# Patient Record
Sex: Female | Born: 1970 | Hispanic: No | Marital: Married | Smoking: Former smoker
Health system: Southern US, Community
[De-identification: ages and names within clinical notes are randomized; demographics above are authoritative.]

## PROBLEM LIST (undated history)

## (undated) DIAGNOSIS — A6920 Lyme disease, unspecified: Secondary | ICD-10-CM

## (undated) DIAGNOSIS — Z803 Family history of malignant neoplasm of breast: Secondary | ICD-10-CM

## (undated) DIAGNOSIS — J45909 Unspecified asthma, uncomplicated: Secondary | ICD-10-CM

## (undated) DIAGNOSIS — C189 Malignant neoplasm of colon, unspecified: Secondary | ICD-10-CM

## (undated) DIAGNOSIS — Z800 Family history of malignant neoplasm of digestive organs: Secondary | ICD-10-CM

## (undated) DIAGNOSIS — I10 Essential (primary) hypertension: Secondary | ICD-10-CM

## (undated) DIAGNOSIS — L509 Urticaria, unspecified: Secondary | ICD-10-CM

## (undated) DIAGNOSIS — Z87442 Personal history of urinary calculi: Secondary | ICD-10-CM

## (undated) DIAGNOSIS — T783XXA Angioneurotic edema, initial encounter: Secondary | ICD-10-CM

## (undated) DIAGNOSIS — C801 Malignant (primary) neoplasm, unspecified: Secondary | ICD-10-CM

## (undated) DIAGNOSIS — L309 Dermatitis, unspecified: Secondary | ICD-10-CM

## (undated) HISTORY — DX: Unspecified asthma, uncomplicated: J45.909

## (undated) HISTORY — DX: Urticaria, unspecified: L50.9

## (undated) HISTORY — DX: Lyme disease, unspecified: A69.20

## (undated) HISTORY — PX: WISDOM TOOTH EXTRACTION: SHX21

## (undated) HISTORY — DX: Angioneurotic edema, initial encounter: T78.3XXA

## (undated) HISTORY — DX: Family history of malignant neoplasm of breast: Z80.3

## (undated) HISTORY — DX: Dermatitis, unspecified: L30.9

## (undated) HISTORY — DX: Essential (primary) hypertension: I10

## (undated) HISTORY — PX: APPENDECTOMY: SHX54

## (undated) HISTORY — DX: Family history of malignant neoplasm of digestive organs: Z80.0

---

## 1997-02-07 ENCOUNTER — Inpatient Hospital Stay (HOSPITAL_COMMUNITY): Admission: AD | Admit: 1997-02-07 | Discharge: 1997-02-09 | Payer: Self-pay | Admitting: *Deleted

## 1997-02-13 ENCOUNTER — Encounter: Admission: RE | Admit: 1997-02-13 | Discharge: 1997-05-14 | Payer: Self-pay | Admitting: Obstetrics and Gynecology

## 1999-02-04 ENCOUNTER — Inpatient Hospital Stay (HOSPITAL_COMMUNITY): Admission: AD | Admit: 1999-02-04 | Discharge: 1999-02-04 | Payer: Self-pay | Admitting: Obstetrics

## 1999-03-08 ENCOUNTER — Other Ambulatory Visit: Admission: RE | Admit: 1999-03-08 | Discharge: 1999-03-08 | Payer: Self-pay | Admitting: Obstetrics and Gynecology

## 1999-07-17 ENCOUNTER — Inpatient Hospital Stay (HOSPITAL_COMMUNITY): Admission: AD | Admit: 1999-07-17 | Discharge: 1999-07-17 | Payer: Self-pay | Admitting: Obstetrics and Gynecology

## 1999-07-24 ENCOUNTER — Ambulatory Visit (HOSPITAL_COMMUNITY): Admission: RE | Admit: 1999-07-24 | Discharge: 1999-07-24 | Payer: Self-pay | Admitting: *Deleted

## 1999-07-24 ENCOUNTER — Encounter: Payer: Self-pay | Admitting: *Deleted

## 1999-09-05 ENCOUNTER — Ambulatory Visit (HOSPITAL_COMMUNITY): Admission: RE | Admit: 1999-09-05 | Discharge: 1999-09-05 | Payer: Self-pay | Admitting: Obstetrics and Gynecology

## 1999-09-05 ENCOUNTER — Encounter: Payer: Self-pay | Admitting: Obstetrics and Gynecology

## 1999-10-13 ENCOUNTER — Inpatient Hospital Stay (HOSPITAL_COMMUNITY): Admission: AD | Admit: 1999-10-13 | Discharge: 1999-10-15 | Payer: Self-pay | Admitting: Obstetrics and Gynecology

## 2001-04-14 ENCOUNTER — Other Ambulatory Visit: Admission: RE | Admit: 2001-04-14 | Discharge: 2001-04-14 | Payer: Self-pay | Admitting: Obstetrics and Gynecology

## 2001-06-16 ENCOUNTER — Inpatient Hospital Stay (HOSPITAL_COMMUNITY): Admission: AD | Admit: 2001-06-16 | Discharge: 2001-06-16 | Payer: Self-pay | Admitting: Obstetrics and Gynecology

## 2001-07-07 ENCOUNTER — Encounter: Payer: Self-pay | Admitting: Obstetrics and Gynecology

## 2001-07-07 ENCOUNTER — Ambulatory Visit (HOSPITAL_COMMUNITY): Admission: RE | Admit: 2001-07-07 | Discharge: 2001-07-07 | Payer: Self-pay | Admitting: Obstetrics and Gynecology

## 2001-10-07 ENCOUNTER — Inpatient Hospital Stay (HOSPITAL_COMMUNITY): Admission: AD | Admit: 2001-10-07 | Discharge: 2001-10-07 | Payer: Self-pay | Admitting: Obstetrics and Gynecology

## 2001-11-03 ENCOUNTER — Ambulatory Visit (HOSPITAL_COMMUNITY): Admission: RE | Admit: 2001-11-03 | Discharge: 2001-11-03 | Payer: Self-pay | Admitting: Obstetrics and Gynecology

## 2001-11-03 ENCOUNTER — Encounter: Payer: Self-pay | Admitting: Obstetrics and Gynecology

## 2001-11-13 ENCOUNTER — Inpatient Hospital Stay (HOSPITAL_COMMUNITY): Admission: AD | Admit: 2001-11-13 | Discharge: 2001-11-15 | Payer: Self-pay | Admitting: Obstetrics and Gynecology

## 2001-11-16 ENCOUNTER — Encounter: Admission: RE | Admit: 2001-11-16 | Discharge: 2001-12-16 | Payer: Self-pay | Admitting: Obstetrics and Gynecology

## 2002-01-16 ENCOUNTER — Encounter: Admission: RE | Admit: 2002-01-16 | Discharge: 2002-02-15 | Payer: Self-pay | Admitting: Obstetrics and Gynecology

## 2008-10-24 ENCOUNTER — Emergency Department (HOSPITAL_COMMUNITY): Admission: EM | Admit: 2008-10-24 | Discharge: 2008-10-24 | Payer: Self-pay | Admitting: Emergency Medicine

## 2009-10-12 ENCOUNTER — Ambulatory Visit (HOSPITAL_COMMUNITY): Admission: RE | Admit: 2009-10-12 | Discharge: 2009-10-12 | Payer: Self-pay | Admitting: Gastroenterology

## 2009-10-29 ENCOUNTER — Ambulatory Visit (HOSPITAL_COMMUNITY)
Admission: RE | Admit: 2009-10-29 | Discharge: 2009-10-29 | Payer: Self-pay | Source: Home / Self Care | Admitting: Gastroenterology

## 2009-12-04 ENCOUNTER — Ambulatory Visit (HOSPITAL_COMMUNITY): Admission: RE | Admit: 2009-12-04 | Discharge: 2009-12-04 | Payer: Self-pay | Admitting: Gastroenterology

## 2010-04-11 LAB — URINALYSIS, ROUTINE W REFLEX MICROSCOPIC
Ketones, ur: 40 mg/dL — AB
Nitrite: NEGATIVE
Specific Gravity, Urine: 1.024 (ref 1.005–1.030)
Urobilinogen, UA: 1 mg/dL (ref 0.0–1.0)
pH: 5 (ref 5.0–8.0)

## 2010-04-11 LAB — DIFFERENTIAL
Eosinophils Absolute: 0 10*3/uL (ref 0.0–0.7)
Lymphocytes Relative: 6 % — ABNORMAL LOW (ref 12–46)
Lymphs Abs: 0.8 10*3/uL (ref 0.7–4.0)
Monocytes Relative: 6 % (ref 3–12)
Neutro Abs: 12 10*3/uL — ABNORMAL HIGH (ref 1.7–7.7)
Neutrophils Relative %: 89 % — ABNORMAL HIGH (ref 43–77)

## 2010-04-11 LAB — URINE CULTURE: Colony Count: 25000

## 2010-04-11 LAB — CBC
Hemoglobin: 11.1 g/dL — ABNORMAL LOW (ref 12.0–15.0)
MCHC: 35.2 g/dL (ref 30.0–36.0)
MCV: 85.5 fL (ref 78.0–100.0)
RDW: 13.9 % (ref 11.5–15.5)

## 2010-04-11 LAB — URINE MICROSCOPIC-ADD ON

## 2010-04-11 LAB — COMPREHENSIVE METABOLIC PANEL
ALT: 9 U/L (ref 0–35)
Calcium: 8.4 mg/dL (ref 8.4–10.5)
Creatinine, Ser: 0.9 mg/dL (ref 0.4–1.2)
Glucose, Bld: 100 mg/dL — ABNORMAL HIGH (ref 70–99)
Sodium: 133 mEq/L — ABNORMAL LOW (ref 135–145)
Total Protein: 6.8 g/dL (ref 6.0–8.3)
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
## 2011-01-07 HISTORY — PX: ABDOMINAL HYSTERECTOMY: SHX81

## 2012-09-28 ENCOUNTER — Other Ambulatory Visit: Payer: Self-pay | Admitting: Family Medicine

## 2012-09-28 DIAGNOSIS — N946 Dysmenorrhea, unspecified: Secondary | ICD-10-CM

## 2012-09-29 ENCOUNTER — Ambulatory Visit
Admission: RE | Admit: 2012-09-29 | Discharge: 2012-09-29 | Disposition: A | Discharge status: Home / Self Care | Payer: 59 | Source: Ambulatory Visit | Attending: Family Medicine | Admitting: Family Medicine

## 2012-09-29 DIAGNOSIS — N946 Dysmenorrhea, unspecified: Secondary | ICD-10-CM

## 2012-12-09 ENCOUNTER — Other Ambulatory Visit: Payer: Self-pay | Admitting: Obstetrics and Gynecology

## 2014-04-19 ENCOUNTER — Other Ambulatory Visit: Payer: Self-pay | Admitting: Family Medicine

## 2014-04-19 DIAGNOSIS — R1084 Generalized abdominal pain: Secondary | ICD-10-CM

## 2014-04-21 ENCOUNTER — Inpatient Hospital Stay
Admission: RE | Admit: 2014-04-21 | Discharge: 2014-04-21 | Disposition: A | Discharge status: Home / Self Care | Payer: Self-pay | Source: Ambulatory Visit | Attending: Family Medicine | Admitting: Family Medicine

## 2014-09-25 ENCOUNTER — Ambulatory Visit: Payer: Self-pay | Admitting: Family Medicine

## 2016-01-07 DIAGNOSIS — A6920 Lyme disease, unspecified: Secondary | ICD-10-CM

## 2016-01-07 HISTORY — DX: Lyme disease, unspecified: A69.20

## 2016-04-14 DIAGNOSIS — R52 Pain, unspecified: Secondary | ICD-10-CM | POA: Diagnosis not present

## 2016-04-14 DIAGNOSIS — Z842 Family history of other diseases of the genitourinary system: Secondary | ICD-10-CM | POA: Diagnosis not present

## 2016-04-14 DIAGNOSIS — J209 Acute bronchitis, unspecified: Secondary | ICD-10-CM | POA: Diagnosis not present

## 2016-07-04 ENCOUNTER — Encounter: Payer: Self-pay | Admitting: Family

## 2016-07-04 ENCOUNTER — Ambulatory Visit (INDEPENDENT_AMBULATORY_CARE_PROVIDER_SITE_OTHER): Payer: 59 | Admitting: Family

## 2016-07-04 VITALS — BP 155/104 | HR 79 | Temp 100.0°F | Ht 65.0 in | Wt 227.6 lb

## 2016-07-04 DIAGNOSIS — J0100 Acute maxillary sinusitis, unspecified: Secondary | ICD-10-CM | POA: Diagnosis not present

## 2016-07-04 DIAGNOSIS — I10 Essential (primary) hypertension: Secondary | ICD-10-CM

## 2016-07-04 MED ORDER — AMOXICILLIN-POT CLAVULANATE 875-125 MG PO TABS: 1.0000 | ORAL_TABLET | Freq: Two times a day (BID) | ORAL | 0 refills | Status: DC

## 2016-07-04 NOTE — Progress Notes (Signed)
Subjective:    Patient ID: Alison Ramsey, female    DOB: 1970-07-06, 46 y.o.   MRN: [id number]  PT presents to the office today to establish care and complaints of sinus issues. Pt's BP is elevated today. She is not taking decongestants. States she has been told in the past her BP was high and she has tried diet and losing weight.  Sinus Problem  This is a new problem. The current episode started in the past 7 days. The problem has been gradually worsening since onset. The maximum temperature recorded prior to her arrival was 100.4 - 100.9 F. Her pain is at a severity of 7/10. The pain is moderate. Associated symptoms include chills, congestion, coughing, ear pain, a hoarse voice, sinus pressure, sneezing and a sore throat. Past treatments include oral decongestants and acetaminophen. The treatment provided mild relief.  Cough  Associated symptoms include chills, ear pain, a fever and a sore throat.  Fever   Associated symptoms include congestion, coughing, ear pain and a sore throat.      Review of Systems  Constitutional: Positive for chills and fever.  HENT: Positive for congestion, ear pain, hoarse voice, sinus pressure, sneezing and sore throat.   Respiratory: Positive for cough.   All other systems reviewed and are negative.      Objective:   Physical Exam  Constitutional: She is oriented to person, place, and time. She appears well-developed and well-nourished. No distress.  HENT:  Head: Normocephalic and atraumatic.  Right Ear: External ear normal.  Nose: Mucosal edema and rhinorrhea present. Right sinus exhibits maxillary sinus tenderness. Left sinus exhibits maxillary sinus tenderness.  Mouth/Throat: Posterior oropharyngeal erythema present.  Eyes: Pupils are equal, round, and reactive to light.  Neck: Normal range of motion. Neck supple. No thyromegaly present.  Cardiovascular: Normal rate, regular rhythm, normal heart sounds and intact distal pulses.   No murmur  heard. Pulmonary/Chest: Effort normal and breath sounds normal. No respiratory distress. She has no wheezes.  Abdominal: Soft. Bowel sounds are normal. She exhibits no distension. There is no tenderness.  Musculoskeletal: Normal range of motion. She exhibits no edema or tenderness.  Neurological: She is alert and oriented to person, place, and time. She has normal reflexes. No cranial nerve deficit.  Skin: Skin is warm and dry.  Psychiatric: She has a normal mood and affect. Her behavior is normal. Judgment and thought content normal.  Vitals reviewed.     BP (!) 155/104   Pulse 79   Temp 100 F (37.8 C) (Oral)   Ht 5\' 5"  (1.651 m)   Wt 227 lb 9.6 oz (103.2 kg)   BMI 37.87 kg/m      Assessment & Plan:  1. Acute maxillary sinusitis, recurrence not specified - Take meds as prescribed - Use a cool mist humidifier  -Use saline nose sprays frequently -Saline irrigations of the nose can be very helpful if done frequently.  * 4X daily for 1 week*  * Use of a nettie pot can be helpful with this. Follow directions with this* -Force fluids -For any cough or congestion  Use plain Mucinex- regular strength or max strength is fine   * Children- consult with Pharmacist for dosing -For fever or aces or pains- take tylenol or ibuprofen appropriate for age and weight.  * for fevers greater than 101 orally you may alternate ibuprofen and tylenol every  3 hours. -Throat lozenges if help -New toothbrush in 3 days - amoxicillin-clavulanate (AUGMENTIN) 875-125 MG tablet;  Take 1 tablet by mouth 2 (two) times daily.  Dispense: 14 tablet; Refill: 0  2. Essential hypertension PT does not want to start medication today since she is sick. States she will come back and it rechecked and if it is still high at that times she will start medications. Pt encouraged to make CPE appt in 2 weeks    Evelina Dun, FNP

## 2016-07-04 NOTE — Patient Instructions (Signed)

## 2016-07-07 ENCOUNTER — Encounter: Payer: Self-pay | Admitting: Family

## 2016-07-08 ENCOUNTER — Other Ambulatory Visit: Payer: Self-pay | Admitting: Family

## 2016-07-08 MED ORDER — LEVOFLOXACIN 500 MG PO TABS
500.0000 mg | ORAL_TABLET | Freq: Every day | ORAL | 0 refills | Status: DC
Start: 2016-07-08 — End: 2016-07-17

## 2016-07-17 ENCOUNTER — Encounter: Payer: Self-pay | Admitting: Family

## 2016-07-17 ENCOUNTER — Ambulatory Visit (INDEPENDENT_AMBULATORY_CARE_PROVIDER_SITE_OTHER): Payer: 59 | Admitting: Family

## 2016-07-17 VITALS — BP 155/111 | HR 80 | Temp 97.2°F | Ht 65.0 in | Wt 225.0 lb

## 2016-07-17 DIAGNOSIS — I10 Essential (primary) hypertension: Secondary | ICD-10-CM

## 2016-07-17 DIAGNOSIS — Z01419 Encounter for gynecological examination (general) (routine) without abnormal findings: Secondary | ICD-10-CM | POA: Diagnosis not present

## 2016-07-17 DIAGNOSIS — B373 Candidiasis of vulva and vagina: Secondary | ICD-10-CM

## 2016-07-17 DIAGNOSIS — Z0000 Encounter for general adult medical examination without abnormal findings: Secondary | ICD-10-CM | POA: Diagnosis not present

## 2016-07-17 DIAGNOSIS — N898 Other specified noninflammatory disorders of vagina: Secondary | ICD-10-CM

## 2016-07-17 DIAGNOSIS — B3731 Acute candidiasis of vulva and vagina: Secondary | ICD-10-CM

## 2016-07-17 LAB — URINALYSIS, COMPLETE
BILIRUBIN UA: NEGATIVE
Glucose, UA: NEGATIVE
KETONES UA: NEGATIVE
LEUKOCYTES UA: NEGATIVE
Nitrite, UA: NEGATIVE
PH UA: 6 (ref 5.0–7.5)
PROTEIN UA: NEGATIVE
RBC UA: NEGATIVE
SPEC GRAV UA: 1.025 (ref 1.005–1.030)
UUROB: 0.2 mg/dL (ref 0.2–1.0)

## 2016-07-17 LAB — MICROSCOPIC EXAMINATION
BACTERIA UA: NONE SEEN
RBC, UA: NONE SEEN /hpf (ref 0–?)
RENAL EPITHEL UA: NONE SEEN /HPF

## 2016-07-17 LAB — WET PREP FOR TRICH, YEAST, CLUE
Clue Cell Exam: NEGATIVE
TRICHOMONAS EXAM: NEGATIVE
Yeast Exam: NEGATIVE

## 2016-07-17 MED ORDER — LISINOPRIL-HYDROCHLOROTHIAZIDE 20-12.5 MG PO TABS: 1.0000 | ORAL_TABLET | Freq: Every day | ORAL | 3 refills | Status: DC

## 2016-07-17 MED ORDER — FLUCONAZOLE 150 MG PO TABS: 150.0000 mg | ORAL_TABLET | ORAL | 0 refills | Status: DC | PRN

## 2016-07-17 NOTE — Progress Notes (Signed)
Subjective:    Patient ID: Alison Ramsey, female    DOB: 05-Oct-1970, 46 y.o.   MRN: [id number]  Pt presents to the office today CPE with pap.  Gynecologic Exam  The patient's primary symptoms include genital itching and vaginal discharge. The patient's pertinent negatives include no genital lesions. This is a chronic problem. The problem has been unchanged. Pertinent negatives include no headaches. The vaginal discharge was thick.  Hypertension  The current episode started more than 1 month ago. The problem is unchanged. The problem is uncontrolled. Associated symptoms include peripheral edema. Pertinent negatives include no headaches, malaise/fatigue or shortness of breath. Risk factors for coronary artery disease include obesity and sedentary lifestyle. Past treatments include nothing. The current treatment provides no improvement. There is no history of kidney disease, CAD/MI, CVA or heart failure.      Review of Systems  Constitutional: Negative for malaise/fatigue.  Respiratory: Negative for shortness of breath.   Genitourinary: Positive for vaginal discharge.  Neurological: Negative for headaches.  All other systems reviewed and are negative.  Breast Cancer-relatedfamily history is not on file.  Social History   Social History  . Marital status: Married    Spouse name: N/A  . Number of children: N/A  . Years of education: N/A   Social History Main Topics  . Smoking status: Never Smoker  . Smokeless tobacco: Never Used  . Alcohol use Yes     Comment: occasionally  . Drug use: No  . Sexual activity: Yes   Other Topics Concern  . None   Social History Narrative  . None       Objective:   Physical Exam  Constitutional: She is oriented to person, place, and time. She appears well-developed and well-nourished. No distress.  HENT:  Head: Normocephalic and atraumatic.  Right Ear: External ear normal.  Left Ear: External ear normal.  Nose: Nose normal.    Mouth/Throat: Oropharynx is clear and moist.  Eyes: Pupils are equal, round, and reactive to light.  Neck: Normal range of motion. Neck supple. No thyromegaly present.  Cardiovascular: Normal rate, regular rhythm, normal heart sounds and intact distal pulses.   No murmur heard. Pulmonary/Chest: Effort normal and breath sounds normal. No respiratory distress. She has no wheezes. Right breast exhibits no inverted nipple, no mass, no nipple discharge, no skin change and no tenderness. Left breast exhibits no inverted nipple, no mass, no nipple discharge, no skin change and no tenderness. Breasts are symmetrical.  Abdominal: Soft. Bowel sounds are normal. She exhibits no distension. There is no tenderness.  Genitourinary: Vagina normal.  Genitourinary Comments: Bimanual exam- no adnexal masses or tenderness, ovaries nonpalpable   Cervix parous and pink- No discharge   Musculoskeletal: Normal range of motion. She exhibits no edema or tenderness.  Neurological: She is alert and oriented to person, place, and time.  Skin: Skin is warm and dry.  Psychiatric: She has a normal mood and affect. Her behavior is normal. Judgment and thought content normal.  Vitals reviewed.     BP (!) 155/111   Pulse 80   Temp (!) 97.2 F (36.2 C) (Oral)   Ht _0  (1.651 m)   Wt 225 lb (102.1 kg)   BMI 37.44 kg/m      Assessment & Plan:  1. Normal gynecologic examination - Urinalysis, Complete - CMP14+EGFR - Pap IG w/ reflex to HPV when ASC-U  2. Essential hypertension Pt started on Zestoretic 20-12.5 mg today -Dash diet information given -Exercise encouraged -  Stress Management  -Continue current meds -RTO in 2 weeks  - CMP14+EGFR - lisinopril-hydrochlorothiazide (ZESTORETIC) 20-12.5 MG tablet; Take 1 tablet by mouth daily.  Dispense: 90 tablet; Refill: 3  3. Annual physical exam - CBC with Differential/Platelet - CMP14+EGFR - HIV antibody - Lipid panel - VITAMIN D 25 Hydroxy (Vit-D  Deficiency, Fractures) - TSH  4. Vaginal discharge  - WET PREP FOR TRICH, YEAST, CLUE  5. Vagina, candidiasis - fluconazole (DIFLUCAN) 150 MG tablet; Take 1 tablet (150 mg total) by mouth every three (3) days as needed.  Dispense: 3 tablet; Refill: 0   Continue all meds Labs pending Health Maintenance reviewed Diet and exercise encouraged RTO 2 weeks to recheck HTN  Evelina Dun, FNP

## 2016-07-17 NOTE — Patient Instructions (Signed)

## 2016-07-18 LAB — PAP IG W/ RFLX HPV ASCU: PAP Smear Comment: 0

## 2016-07-18 LAB — CBC WITH DIFFERENTIAL/PLATELET
BASOS ABS: 0 10*3/uL (ref 0.0–0.2)
Basos: 0 %
EOS (ABSOLUTE): 0.1 10*3/uL (ref 0.0–0.4)
Eos: 1 %
HEMATOCRIT: 40.6 % (ref 34.0–46.6)
Hemoglobin: 14.2 g/dL (ref 11.1–15.9)
IMMATURE GRANULOCYTES: 0 %
Immature Grans (Abs): 0 10*3/uL (ref 0.0–0.1)
LYMPHS: 16 %
Lymphocytes Absolute: 1.6 10*3/uL (ref 0.7–3.1)
MCH: 29.6 pg (ref 26.6–33.0)
MCHC: 35 g/dL (ref 31.5–35.7)
MCV: 85 fL (ref 79–97)
MONOCYTES: 4 %
Monocytes Absolute: 0.4 10*3/uL (ref 0.1–0.9)
NEUTROS ABS: 7.8 10*3/uL — AB (ref 1.4–7.0)
NEUTROS PCT: 79 %
Platelets: 262 10*3/uL (ref 150–379)
RBC: 4.79 x10E6/uL (ref 3.77–5.28)
RDW: 13.9 % (ref 12.3–15.4)
WBC: 9.9 10*3/uL (ref 3.4–10.8)

## 2016-07-18 LAB — CMP14+EGFR
A/G RATIO: 1.3 (ref 1.2–2.2)
ALK PHOS: 72 IU/L (ref 39–117)
ALT: 12 IU/L (ref 0–32)
AST: 13 IU/L (ref 0–40)
Albumin: 3.9 g/dL (ref 3.5–5.5)
BUN/Creatinine Ratio: 15 (ref 9–23)
BUN: 17 mg/dL (ref 6–24)
Bilirubin Total: 0.7 mg/dL (ref 0.0–1.2)
CHLORIDE: 101 mmol/L (ref 96–106)
CO2: 20 mmol/L (ref 20–29)
Calcium: 9.2 mg/dL (ref 8.7–10.2)
Creatinine, Ser: 1.16 mg/dL — ABNORMAL HIGH (ref 0.57–1.00)
GFR calc Af Amer: 66 mL/min/{1.73_m2} (ref >59)
GFR calc non Af Amer: 57 mL/min/{1.73_m2} — ABNORMAL LOW (ref >59)
GLOBULIN, TOTAL: 3 g/dL (ref 1.5–4.5)
Glucose: 120 mg/dL — ABNORMAL HIGH (ref 65–99)
POTASSIUM: 4.4 mmol/L (ref 3.5–5.2)
SODIUM: 137 mmol/L (ref 134–144)
Total Protein: 6.9 g/dL (ref 6.0–8.5)

## 2016-07-18 LAB — LIPID PANEL
CHOLESTEROL TOTAL: 152 mg/dL (ref 100–199)
Chol/HDL Ratio: 3.8 ratio (ref 0.0–4.4)
HDL: 40 mg/dL (ref >39)
LDL Calculated: 92 mg/dL (ref 0–99)
Triglycerides: 101 mg/dL (ref 0–149)
VLDL Cholesterol Cal: 20 mg/dL (ref 5–40)

## 2016-07-18 LAB — VITAMIN D 25 HYDROXY (VIT D DEFICIENCY, FRACTURES): Vit D, 25-Hydroxy: 42.8 ng/mL (ref 30.0–100.0)

## 2016-07-18 LAB — TSH: TSH: 2.07 u[IU]/mL (ref 0.450–4.500)

## 2016-07-18 LAB — HIV ANTIBODY (ROUTINE TESTING W REFLEX): HIV SCREEN 4TH GENERATION: NONREACTIVE

## 2016-07-31 ENCOUNTER — Ambulatory Visit: Payer: 59 | Admitting: Family

## 2016-08-06 ENCOUNTER — Encounter: Payer: Self-pay | Admitting: Family

## 2016-08-06 ENCOUNTER — Ambulatory Visit (INDEPENDENT_AMBULATORY_CARE_PROVIDER_SITE_OTHER): Payer: 59 | Admitting: Family

## 2016-08-06 VITALS — BP 145/100 | HR 81 | Temp 99.4°F | Ht 65.0 in | Wt 223.0 lb

## 2016-08-06 DIAGNOSIS — H60332 Swimmer's ear, left ear: Secondary | ICD-10-CM | POA: Diagnosis not present

## 2016-08-06 DIAGNOSIS — I10 Essential (primary) hypertension: Secondary | ICD-10-CM

## 2016-08-06 MED ORDER — CIPROFLOXACIN-DEXAMETHASONE 0.3-0.1 % OT SUSP: 4.0000 [drp] | Freq: Two times a day (BID) | OTIC | 0 refills | Status: DC

## 2016-08-06 MED ORDER — LISINOPRIL-HYDROCHLOROTHIAZIDE 20-12.5 MG PO TABS: 2.0000 | ORAL_TABLET | Freq: Every day | ORAL | 3 refills | Status: DC

## 2016-08-06 NOTE — Progress Notes (Signed)
   Subjective:    Patient ID: Alison Ramsey, female    DOB: 04-Nov-1970, 46 y.o.   MRN: [id number]  PT presents to the office today to recheck HTN. Pt's BP is not at goal.  Hypertension  This is a new problem. The current episode started more than 1 month ago. The problem has been waxing and waning since onset. The problem is uncontrolled. Associated symptoms include peripheral edema ("at times"). Pertinent negatives include no malaise/fatigue or shortness of breath. Past treatments include ACE inhibitors and diuretics. The current treatment provides mild improvement.  Otalgia   There is pain in the left ear. This is a recurrent problem. The current episode started in the past 7 days. The problem occurs constantly. The problem has been waxing and waning. Maximum temperature: 99. The pain is at a severity of 7/10. The pain is mild. Associated symptoms include ear discharge and hearing loss. She has tried antibiotics for the symptoms. The treatment provided mild relief.      Review of Systems  Constitutional: Negative for malaise/fatigue.  HENT: Positive for ear discharge, ear pain and hearing loss.   Respiratory: Negative for shortness of breath.   All other systems reviewed and are negative.      Objective:   Physical Exam  Constitutional: She is oriented to person, place, and time. She appears well-developed and well-nourished. No distress.  HENT:  Head: Normocephalic and atraumatic.  Right Ear: External ear normal.  Left Ear: There is drainage, swelling and tenderness.  Mouth/Throat: Oropharynx is clear and moist.  Eyes: Pupils are equal, round, and reactive to light.  Neck: Normal range of motion. Neck supple. No thyromegaly present.  Cardiovascular: Normal rate, regular rhythm, normal heart sounds and intact distal pulses.   No murmur heard. Pulmonary/Chest: Effort normal and breath sounds normal. No respiratory distress. She has no wheezes.  Abdominal: Soft. Bowel sounds are  normal. She exhibits no distension. There is no tenderness.  Musculoskeletal: Normal range of motion. She exhibits no edema or tenderness.  Neurological: She is alert and oriented to person, place, and time.  Skin: Skin is warm and dry.  Psychiatric: She has a normal mood and affect. Her behavior is normal. Judgment and thought content normal.  Vitals reviewed.     BP (!) 145/100   Pulse 81   Temp 99.4 F (37.4 C) (Oral)   Ht '5\' 5"'$  (1.651 m)   Wt 223 lb (101.2 kg)   BMI 37.11 kg/m      Assessment & Plan:  1. Essential hypertension Zestoretic increased to tabs from 1 tab -Dash diet information given -Exercise encouraged - Stress Management  -Continue current meds -RTO in 2 weeks  - lisinopril-hydrochlorothiazide (ZESTORETIC) 20-12.5 MG tablet; Take 2 tablets by mouth daily.  Dispense: 180 tablet; Refill: 3 - BMP8+EGFR  2. Acute swimmer's ear of left side Keep clean and dry Avoid getting water in ear Do not stick anything into ear - ciprofloxacin-dexamethasone (CIPRODEX) OTIC suspension; Place 4 drops into the left ear 2 (two) times daily.  Dispense: 7.5 mL; Refill: 0    Evelina Dun, FNP

## 2016-08-06 NOTE — Patient Instructions (Signed)

## 2016-08-07 ENCOUNTER — Other Ambulatory Visit: Payer: Self-pay | Admitting: Family

## 2016-08-07 LAB — BMP8+EGFR
BUN / CREAT RATIO: 19 (ref 9–23)
BUN: 17 mg/dL (ref 6–24)
CHLORIDE: 102 mmol/L (ref 96–106)
CO2: 20 mmol/L (ref 20–29)
Calcium: 9.5 mg/dL (ref 8.7–10.2)
Creatinine, Ser: 0.89 mg/dL (ref 0.57–1.00)
GFR calc Af Amer: 91 mL/min/{1.73_m2} (ref >59)
GFR calc non Af Amer: 79 mL/min/{1.73_m2} (ref >59)
Glucose: 112 mg/dL — ABNORMAL HIGH (ref 65–99)
Potassium: 4.1 mmol/L (ref 3.5–5.2)
SODIUM: 141 mmol/L (ref 134–144)

## 2016-08-07 MED ORDER — OFLOXACIN 0.3 % OT SOLN: 10.0000 [drp] | Freq: Every day | OTIC | 0 refills | Status: DC

## 2016-08-19 ENCOUNTER — Telehealth: Payer: Self-pay | Admitting: Family

## 2016-08-19 MED ORDER — OFLOXACIN 0.3 % OT SOLN: 10.0000 [drp] | Freq: Every day | OTIC | 0 refills | Status: DC

## 2016-08-19 MED ORDER — OFLOXACIN 0.3 % OT SOLN
10.0000 [drp] | Freq: Every day | OTIC | 0 refills | Status: AC
Start: 2016-08-19 — End: 2016-09-02

## 2016-08-19 NOTE — Telephone Encounter (Signed)
[facility]s nurse told by switchboard that patient wanted prescription sent to [facility] instead of [facility] where it was originally sent.  So I contacted [facility] to cancel, sent new script to [facility], contacted patient to inform her drops were changed from Ciprodex to Ofloxacin on 08/07/16, and pharmacy had been changed to [place] per her request.  Patient informed me that she did not want pharmacy changed to [place], needed to be [facility] [place].  At that time, I contacted [facility] to cancel and called [facility] back to inform them to go ahead with prescription, patient would pick up.

## 2016-08-19 NOTE — Telephone Encounter (Signed)
Patient aware, script is ready ar pharmacy.

## 2016-08-19 NOTE — Telephone Encounter (Signed)
What symptoms do you have? Both ears are hurting and running a fever. Pharmacy never got the last RX called in.  How long have you been sick? 2 weeks  Have you been seen for this problem? 2 weeks ago  If your provider decides to give you a prescription, which pharmacy would you like for it to be sent to? [facility] on [address] in [place].   Patient informed that this information will be sent to the clinical staff for review and that they should receive a follow up call.

## 2016-08-21 ENCOUNTER — Encounter: Payer: Self-pay | Admitting: Family

## 2016-08-21 ENCOUNTER — Ambulatory Visit (INDEPENDENT_AMBULATORY_CARE_PROVIDER_SITE_OTHER): Payer: 59 | Admitting: Family

## 2016-08-21 VITALS — BP 107/75 | HR 89 | Temp 97.6°F | Ht 65.0 in | Wt 219.0 lb

## 2016-08-21 DIAGNOSIS — I10 Essential (primary) hypertension: Secondary | ICD-10-CM

## 2016-08-21 NOTE — Progress Notes (Signed)
   Subjective:    Patient ID: Alison Ramsey, female    DOB: 1970-11-12, 46 y.o.   MRN: [id number]  PT presents to the office today to recheck HTN. Pt's BP is at goal! Hypertension  This is a chronic problem. The current episode started more than 1 year ago. The problem has been resolved since onset. The problem is controlled. Pertinent negatives include no headaches, malaise/fatigue, palpitations, peripheral edema or shortness of breath. Past treatments include ACE inhibitors and diuretics. The current treatment provides moderate improvement. There is no history of kidney disease, CAD/MI, CVA or heart failure.      Review of Systems  Constitutional: Negative for malaise/fatigue.  Respiratory: Negative for shortness of breath.   Cardiovascular: Negative for palpitations.  Neurological: Negative for headaches.  All other systems reviewed and are negative.      Objective:   Physical Exam  Constitutional: She is oriented to person, place, and time. She appears well-developed and well-nourished. No distress.  HENT:  Head: Normocephalic and atraumatic.  Right Ear: External ear normal.  Left Ear: External ear normal.  Nose: Nose normal.  Mouth/Throat: Oropharynx is clear and moist.  Eyes: Pupils are equal, round, and reactive to light.  Neck: Normal range of motion. Neck supple. No thyromegaly present.  Cardiovascular: Normal rate, regular rhythm, normal heart sounds and intact distal pulses.   No murmur heard. Pulmonary/Chest: Effort normal and breath sounds normal. No respiratory distress. She has no wheezes.  Abdominal: Soft. Bowel sounds are normal. She exhibits no distension. There is no tenderness.  Musculoskeletal: Normal range of motion. She exhibits no edema or tenderness.  Neurological: She is alert and oriented to person, place, and time.  Skin: Skin is warm and dry.  Psychiatric: She has a normal mood and affect. Her behavior is normal. Judgment and thought content normal.    Vitals reviewed.    BP 107/75   Pulse 89   Temp 97.6 F (36.4 C) (Oral)   Ht _0  (1.651 m)   Wt 219 lb (99.3 kg)   BMI 36.44 kg/m      Assessment & Plan:  1. Essential hypertension -Dash diet information given -Exercise encouraged - Stress Management  -Continue current meds -RTO in 6 months  - CMP14+EGFR   Evelina Dun, FNP

## 2016-08-21 NOTE — Patient Instructions (Signed)

## 2016-08-22 LAB — CMP14+EGFR
A/G RATIO: 1.6 (ref 1.2–2.2)
ALT: 10 IU/L (ref 0–32)
AST: 12 IU/L (ref 0–40)
Albumin: 4.2 g/dL (ref 3.5–5.5)
Alkaline Phosphatase: 75 IU/L (ref 39–117)
BILIRUBIN TOTAL: 0.3 mg/dL (ref 0.0–1.2)
BUN/Creatinine Ratio: 18 (ref 9–23)
BUN: 19 mg/dL (ref 6–24)
CALCIUM: 9.6 mg/dL (ref 8.7–10.2)
CHLORIDE: 102 mmol/L (ref 96–106)
CO2: 20 mmol/L (ref 20–29)
Creatinine, Ser: 1.06 mg/dL — ABNORMAL HIGH (ref 0.57–1.00)
GFR calc Af Amer: 73 mL/min/{1.73_m2} (ref >59)
GFR, EST NON AFRICAN AMERICAN: 64 mL/min/{1.73_m2} (ref >59)
GLOBULIN, TOTAL: 2.6 g/dL (ref 1.5–4.5)
Glucose: 116 mg/dL — ABNORMAL HIGH (ref 65–99)
POTASSIUM: 4.3 mmol/L (ref 3.5–5.2)
SODIUM: 138 mmol/L (ref 134–144)
Total Protein: 6.8 g/dL (ref 6.0–8.5)

## 2016-11-25 DIAGNOSIS — Z1231 Encounter for screening mammogram for malignant neoplasm of breast: Secondary | ICD-10-CM | POA: Diagnosis not present

## 2017-01-02 ENCOUNTER — Encounter: Payer: Self-pay | Admitting: Family

## 2017-01-12 ENCOUNTER — Other Ambulatory Visit: Payer: Self-pay | Admitting: Family

## 2017-01-12 DIAGNOSIS — N6489 Other specified disorders of breast: Secondary | ICD-10-CM

## 2017-01-13 ENCOUNTER — Encounter (INDEPENDENT_AMBULATORY_CARE_PROVIDER_SITE_OTHER): Payer: Self-pay

## 2017-01-20 ENCOUNTER — Ambulatory Visit (HOSPITAL_COMMUNITY)
Admission: RE | Admit: 2017-01-20 | Discharge: 2017-01-20 | Disposition: A | Discharge status: Home / Self Care | Payer: 59 | Source: Ambulatory Visit | Attending: Family | Admitting: Family

## 2017-01-20 DIAGNOSIS — N6489 Other specified disorders of breast: Secondary | ICD-10-CM | POA: Diagnosis not present

## 2017-01-20 DIAGNOSIS — R928 Other abnormal and inconclusive findings on diagnostic imaging of breast: Secondary | ICD-10-CM | POA: Diagnosis not present

## 2017-01-21 ENCOUNTER — Encounter: Payer: Self-pay | Admitting: *Deleted

## 2017-01-22 DIAGNOSIS — R350 Frequency of micturition: Secondary | ICD-10-CM | POA: Diagnosis not present

## 2017-01-22 DIAGNOSIS — R102 Pelvic and perineal pain: Secondary | ICD-10-CM | POA: Diagnosis not present

## 2017-01-30 DIAGNOSIS — R82998 Other abnormal findings in urine: Secondary | ICD-10-CM | POA: Diagnosis not present

## 2017-01-30 DIAGNOSIS — R300 Dysuria: Secondary | ICD-10-CM | POA: Diagnosis not present

## 2017-01-30 DIAGNOSIS — N760 Acute vaginitis: Secondary | ICD-10-CM | POA: Diagnosis not present

## 2017-02-23 ENCOUNTER — Ambulatory Visit: Payer: 59 | Admitting: Family

## 2017-02-24 ENCOUNTER — Ambulatory Visit: Payer: 59 | Admitting: Family

## 2017-02-25 ENCOUNTER — Encounter: Payer: Self-pay | Admitting: Family

## 2017-03-02 ENCOUNTER — Ambulatory Visit: Payer: 59 | Admitting: Family

## 2017-03-05 ENCOUNTER — Encounter: Payer: Self-pay | Admitting: Family

## 2017-03-05 ENCOUNTER — Ambulatory Visit (INDEPENDENT_AMBULATORY_CARE_PROVIDER_SITE_OTHER): Payer: 59 | Admitting: Family

## 2017-03-05 VITALS — BP 105/72 | HR 85 | Temp 97.1°F | Ht 65.0 in | Wt 234.0 lb

## 2017-03-05 DIAGNOSIS — I10 Essential (primary) hypertension: Secondary | ICD-10-CM | POA: Diagnosis not present

## 2017-03-05 MED ORDER — LISINOPRIL-HYDROCHLOROTHIAZIDE 20-25 MG PO TABS: 1.0000 | ORAL_TABLET | Freq: Every day | ORAL | 3 refills | Status: DC

## 2017-03-05 NOTE — Patient Instructions (Signed)

## 2017-03-05 NOTE — Progress Notes (Signed)
   Subjective:    Patient ID: Alison Ramsey, female    DOB: 1970/11/29, 47 y.o.   MRN: [id number]  PT presents to the office today for B/P. PT states her BP has been "running on the lower side". PT states she monitors her BP at home and averages 105/60's.  Hypertension  This is a chronic problem. The current episode started more than 1 year ago. The problem has been resolved since onset. The problem is controlled. Associated symptoms include peripheral edema ("at times"). Pertinent negatives include no headaches or shortness of breath. Risk factors for coronary artery disease include obesity. The current treatment provides moderate improvement. There is no history of kidney disease or CAD/MI.      Review of Systems  Respiratory: Negative for shortness of breath.   Neurological: Negative for headaches.  All other systems reviewed and are negative.      Objective:   Physical Exam  Constitutional: She is oriented to person, place, and time. She appears well-developed and well-nourished. No distress.  HENT:  Head: Normocephalic.  Eyes: Pupils are equal, round, and reactive to light.  Neck: Normal range of motion. Neck supple. No thyromegaly present.  Cardiovascular: Normal rate, regular rhythm, normal heart sounds and intact distal pulses.  No murmur heard. Pulmonary/Chest: Effort normal and breath sounds normal. No respiratory distress. She has no wheezes.  Abdominal: Soft. Bowel sounds are normal. She exhibits no distension. There is no tenderness.  Musculoskeletal: Normal range of motion. She exhibits no edema or tenderness.  Neurological: She is alert and oriented to person, place, and time.  Skin: Skin is warm and dry.  Psychiatric: She has a normal mood and affect. Her behavior is normal. Judgment and thought content normal.  Vitals reviewed.    BP 105/72   Pulse 85   Temp (!) 97.1 F (36.2 C) (Oral)   Ht '5\' 5"'$  (1.651 m)   Wt 234 lb (106.1 kg)   BMI 38.94 kg/m        Assessment & Plan:  1. Essential hypertension Will decrease lisinopril to 20 mg from 40 mg, will continue HCTZ 25 mg  -Dash diet information given -Exercise encouraged - Stress Management  -Continue current meds -RTO in 1 month - lisinopril-hydrochlorothiazide (PRINZIDE,ZESTORETIC) 20-25 MG tablet; Take 1 tablet by mouth daily.  Dispense: 90 tablet; Refill: 3 - BMP8+EGFR   Evelina Dun, FNP

## 2017-03-06 LAB — BMP8+EGFR
BUN/Creatinine Ratio: 12 (ref 9–23)
BUN: 14 mg/dL (ref 6–24)
CALCIUM: 9.1 mg/dL (ref 8.7–10.2)
CHLORIDE: 100 mmol/L (ref 96–106)
CO2: 24 mmol/L (ref 20–29)
Creatinine, Ser: 1.16 mg/dL — ABNORMAL HIGH (ref 0.57–1.00)
GFR calc Af Amer: 65 mL/min/{1.73_m2} (ref >59)
GFR calc non Af Amer: 57 mL/min/{1.73_m2} — ABNORMAL LOW (ref >59)
GLUCOSE: 89 mg/dL (ref 65–99)
POTASSIUM: 4.3 mmol/L (ref 3.5–5.2)
SODIUM: 140 mmol/L (ref 134–144)

## 2017-04-02 ENCOUNTER — Encounter: Payer: Self-pay | Admitting: Family

## 2017-04-02 ENCOUNTER — Ambulatory Visit (INDEPENDENT_AMBULATORY_CARE_PROVIDER_SITE_OTHER): Payer: 59 | Admitting: Family

## 2017-04-02 VITALS — BP 105/79 | HR 71 | Temp 97.7°F | Ht 65.0 in | Wt 232.8 lb

## 2017-04-02 DIAGNOSIS — I10 Essential (primary) hypertension: Secondary | ICD-10-CM | POA: Diagnosis not present

## 2017-04-02 MED ORDER — HYDROCHLOROTHIAZIDE 25 MG PO TABS: 25.0000 mg | ORAL_TABLET | Freq: Every day | ORAL | 3 refills | Status: DC

## 2017-04-02 NOTE — Progress Notes (Signed)
   Subjective:    Patient ID: Alison Ramsey, female    DOB: 1970-06-21, 47 y.o.   MRN: [id number]  PT presents to the office today to recheck HTN after decrease lisinopril from 40 mg to 20 mg. She has continued her HCTZ 25 mg.  Hypertension  This is a chronic problem. The current episode started more than 1 year ago. The problem has been resolved since onset. The problem is controlled. Associated symptoms include peripheral edema ("at times when I eat things I shouldn't"). Pertinent negatives include no headaches, malaise/fatigue or shortness of breath. Past treatments include ACE inhibitors and diuretics. The current treatment provides mild improvement.      Review of Systems  Constitutional: Negative for malaise/fatigue.  Respiratory: Negative for shortness of breath.   Neurological: Negative for headaches.  All other systems reviewed and are negative.      Objective:   Physical Exam  Constitutional: She is oriented to person, place, and time. She appears well-developed and well-nourished. No distress.  HENT:  Head: Normocephalic and atraumatic.  Right Ear: External ear normal.  Left Ear: External ear normal.  Nose: Nose normal.  Mouth/Throat: Oropharynx is clear and moist.  Eyes: Pupils are equal, round, and reactive to light.  Neck: Normal range of motion. Neck supple. No thyromegaly present.  Cardiovascular: Normal rate, regular rhythm, normal heart sounds and intact distal pulses.  No murmur heard. Pulmonary/Chest: Effort normal and breath sounds normal. No respiratory distress. She has no wheezes.  Abdominal: Soft. Bowel sounds are normal. She exhibits no distension. There is no tenderness.  Musculoskeletal: Normal range of motion. She exhibits no edema or tenderness.  Neurological: She is alert and oriented to person, place, and time.  Skin: Skin is warm and dry.  Psychiatric: She has a normal mood and affect. Her behavior is normal. Judgment and thought content normal.    Vitals reviewed.     BP 105/79   Pulse 71   Temp 97.7 F (36.5 C) (Oral)   Ht _0  (1.651 m)   Wt 232 lb 12.8 oz (105.6 kg)   BMI 38.74 kg/m      Assessment & Plan:  1. Essential hypertension Will stop Lisinopril today, continue HCTZ 25 mg -Dash diet information given -Exercise encouraged - Stress Management  -Continue current meds -RTO in 4 weeks  - hydrochlorothiazide (HYDRODIURIL) 25 MG tablet; Take 1 tablet (25 mg total) by mouth daily.  Dispense: 90 tablet; Refill: 3 - BMP8+EGFR    Evelina Dun, FNP

## 2017-04-02 NOTE — Patient Instructions (Signed)

## 2017-04-03 LAB — BMP8+EGFR
BUN/Creatinine Ratio: 15 (ref 9–23)
BUN: 13 mg/dL (ref 6–24)
CO2: 22 mmol/L (ref 20–29)
Calcium: 9.6 mg/dL (ref 8.7–10.2)
Chloride: 102 mmol/L (ref 96–106)
Creatinine, Ser: 0.88 mg/dL (ref 0.57–1.00)
GFR calc Af Amer: 91 mL/min/{1.73_m2} (ref >59)
GFR calc non Af Amer: 79 mL/min/{1.73_m2} (ref >59)
GLUCOSE: 94 mg/dL (ref 65–99)
POTASSIUM: 4.5 mmol/L (ref 3.5–5.2)
SODIUM: 138 mmol/L (ref 134–144)

## 2017-05-04 ENCOUNTER — Ambulatory Visit: Payer: 59 | Admitting: Family

## 2017-05-11 ENCOUNTER — Ambulatory Visit: Payer: 59 | Admitting: Family

## 2017-05-15 ENCOUNTER — Encounter: Payer: Self-pay | Admitting: Family

## 2017-05-19 ENCOUNTER — Ambulatory Visit: Payer: 59 | Admitting: Family

## 2017-05-19 ENCOUNTER — Encounter: Payer: Self-pay | Admitting: Family

## 2017-05-19 VITALS — BP 116/88 | HR 78 | Temp 97.8°F | Ht 65.0 in | Wt 227.6 lb

## 2017-05-19 DIAGNOSIS — I10 Essential (primary) hypertension: Secondary | ICD-10-CM

## 2017-05-19 NOTE — Progress Notes (Signed)
   Subjective:    Patient ID: Alison Ramsey, female    DOB: [birth year], 47 y.o.   MRN: [id number]  Chief Complaint  Patient presents with  . Hypertension    recheck    Hypertension  This is a chronic problem. The current episode started more than 1 year ago. The problem has been resolved since onset. The problem is controlled. Associated symptoms include peripheral edema ("trace"). Pertinent negatives include no headaches, malaise/fatigue or shortness of breath. Past treatments include diuretics. The current treatment provides moderate improvement. There is no history of kidney disease.      Review of Systems  Constitutional: Negative for malaise/fatigue.  Respiratory: Negative for shortness of breath.   Neurological: Negative for headaches.  All other systems reviewed and are negative.      Objective:   Physical Exam  Constitutional: She appears well-developed and well-nourished. No distress.  HENT:  Head: Normocephalic and atraumatic.  Right Ear: External ear normal.  Left Ear: External ear normal.  Mouth/Throat: Oropharynx is clear and moist.  Eyes: Pupils are equal, round, and reactive to light.  Neck: Normal range of motion. Neck supple. No thyromegaly present.  Cardiovascular: Normal rate, regular rhythm, normal heart sounds and intact distal pulses.  No murmur heard. Pulmonary/Chest: Effort normal and breath sounds normal. No respiratory distress. She has no wheezes.  Abdominal: Soft. Bowel sounds are normal. She exhibits no distension. There is no tenderness.  Musculoskeletal: Normal range of motion. She exhibits no edema or tenderness.  Neurological: She is alert. She has normal reflexes.  Skin: Skin is warm and dry.  Psychiatric: She has a normal mood and affect. Her behavior is normal. Judgment and thought content normal.  Vitals reviewed.     BP 116/88   Pulse 78   Temp 97.8 F (36.6 C) (Oral)   Ht '5\' 5"'$  (1.651 m)   Wt 227 lb 9.6 oz (103.2 kg)   BMI  37.87 kg/m      Assessment & Plan:  Alison Ramsey was seen today for hypertension.  Diagnoses and all orders for this visit:  Essential hypertension -     BMP8+EGFR   -Dash diet information given -Exercise encouraged - Stress Management  -Continue current meds -RTO in 1 year   Evelina Dun, FNP

## 2017-05-19 NOTE — Patient Instructions (Signed)

## 2017-05-20 LAB — BMP8+EGFR
BUN/Creatinine Ratio: 18 (ref 9–23)
BUN: 20 mg/dL (ref 6–24)
CO2: 23 mmol/L (ref 20–29)
CREATININE: 1.12 mg/dL — AB (ref 0.57–1.00)
Calcium: 9.6 mg/dL (ref 8.7–10.2)
Chloride: 101 mmol/L (ref 96–106)
GFR calc Af Amer: 68 mL/min/{1.73_m2} (ref >59)
GFR calc non Af Amer: 59 mL/min/{1.73_m2} — ABNORMAL LOW (ref >59)
GLUCOSE: 94 mg/dL (ref 65–99)
Potassium: 3.9 mmol/L (ref 3.5–5.2)
Sodium: 139 mmol/L (ref 134–144)

## 2017-09-16 ENCOUNTER — Encounter: Payer: Self-pay | Admitting: Physician Assistant

## 2017-09-16 ENCOUNTER — Ambulatory Visit: Payer: 59 | Admitting: Physician Assistant

## 2017-09-16 VITALS — BP 112/87 | HR 86 | Temp 97.0°F | Ht 65.0 in | Wt 217.0 lb

## 2017-09-16 DIAGNOSIS — J209 Acute bronchitis, unspecified: Secondary | ICD-10-CM | POA: Diagnosis not present

## 2017-09-16 MED ORDER — ALBUTEROL SULFATE HFA 108 (90 BASE) MCG/ACT IN AERS
2.0000 | INHALATION_SPRAY | Freq: Four times a day (QID) | RESPIRATORY_TRACT | 0 refills | Status: DC | PRN
Start: 2017-09-16 — End: 2021-04-18

## 2017-09-16 MED ORDER — AZITHROMYCIN 250 MG PO TABS: ORAL_TABLET | ORAL | 0 refills | Status: DC

## 2017-09-16 MED ORDER — BENZONATATE 200 MG PO CAPS: 200.0000 mg | ORAL_CAPSULE | Freq: Two times a day (BID) | ORAL | 0 refills | Status: DC | PRN

## 2017-09-16 NOTE — Progress Notes (Signed)
BP 112/87   Pulse 86   Temp (!) 97 F (36.1 C) (Oral)   Ht '5\' 5"'$  (1.651 m)   Wt 217 lb (98.4 kg)   BMI 36.11 kg/m    Subjective:    Patient ID: Alison Ramsey, female    DOB: May 29, 1970, 47 y.o.   MRN: [id number]  HPI: Alison Ramsey is a 47 y.o. female presenting on 09/16/2017 for chest congestion and Cough  Patient with several days of progressing upper respiratory and bronchial symptoms. Initially there was more upper respiratory congestion. This progressed to having significant cough that is productive throughout the day and severe at night. There is occasional wheezing after coughing. Sometimes there is slight dyspnea on exertion. It is productive mucus that is yellow in color. Denies any blood.   Past Medical History:  Diagnosis Date  . Hypertension   . Lyme disease 2018   Relevant past medical, surgical, family and social history reviewed and updated as indicated. Interim medical history since our last visit reviewed. Allergies and medications reviewed and updated. DATA REVIEWED: CHART IN EPIC  Family History reviewed for pertinent findings.  Review of Systems  Constitutional: Positive for chills, fatigue and fever. Negative for activity change and appetite change.  HENT: Positive for congestion and postnasal drip. Negative for sore throat.   Eyes: Negative.   Respiratory: Positive for cough and wheezing. Negative for shortness of breath.   Cardiovascular: Negative.  Negative for chest pain, palpitations and leg swelling.  Gastrointestinal: Negative.   Genitourinary: Negative.   Musculoskeletal: Negative.   Skin: Negative.   Neurological: Positive for headaches.    Allergies as of 09/16/2017      Reactions   Codeine Nausea And Vomiting, Rash   hives   Latex Rash      Medication List        Accurate as of 09/16/17 10:08 AM. Always use your most recent med list.          albuterol 108 (90 Base) MCG/ACT inhaler Commonly known as:  PROVENTIL HFA;VENTOLIN  HFA Inhale 2 puffs into the lungs every 6 (six) hours as needed for wheezing or shortness of breath.   azithromycin 250 MG tablet Commonly known as:  ZITHROMAX Take as directed   benzonatate 200 MG capsule Commonly known as:  TESSALON Take 1 capsule (200 mg total) by mouth 2 (two) times daily as needed for cough.   hydrochlorothiazide 25 MG tablet Commonly known as:  HYDRODIURIL Take 1 tablet (25 mg total) by mouth daily.          Objective:    BP 112/87   Pulse 86   Temp (!) 97 F (36.1 C) (Oral)   Ht '5\' 5"'$  (1.651 m)   Wt 217 lb (98.4 kg)   BMI 36.11 kg/m   Allergies  Allergen Reactions  . Codeine Nausea And Vomiting and Rash    hives  . Latex Rash    Wt Readings from Last 3 Encounters:  09/16/17 217 lb (98.4 kg)  05/19/17 227 lb 9.6 oz (103.2 kg)  04/02/17 232 lb 12.8 oz (105.6 kg)    Physical Exam  Constitutional: She is oriented to person, place, and time. She appears well-developed and well-nourished.  HENT:  Head: Normocephalic and atraumatic.  Right Ear: There is drainage and tenderness.  Left Ear: There is drainage and tenderness.  Nose: Mucosal edema and rhinorrhea present. Right sinus exhibits no maxillary sinus tenderness and no frontal sinus tenderness. Left sinus exhibits no maxillary  sinus tenderness and no frontal sinus tenderness.  Mouth/Throat: Oropharyngeal exudate and posterior oropharyngeal erythema present.  Eyes: Pupils are equal, round, and reactive to light. Conjunctivae and EOM are normal.  Neck: Normal range of motion. Neck supple.  Cardiovascular: Normal rate, regular rhythm, normal heart sounds and intact distal pulses.  Pulmonary/Chest: Effort normal. She has wheezes in the right upper field and the left upper field.  Abdominal: Soft. Bowel sounds are normal.  Neurological: She is alert and oriented to person, place, and time. She has normal reflexes.  Skin: Skin is warm and dry. No rash noted.  Psychiatric: She has a normal mood  and affect. Her behavior is normal. Judgment and thought content normal.    Results for orders placed or performed in visit on 05/19/17  [facility]  Result Value Ref Range   Glucose 94 65 - 99 mg/dL   BUN 20 6 - 24 mg/dL   Creatinine, Ser 1.12 (H) 0.57 - 1.00 mg/dL   GFR calc non Af Amer 59 (L) >59 mL/min/1.73   GFR calc Af Amer 68 >59 mL/min/1.73   BUN/Creatinine Ratio 18 9 - 23   Sodium 139 134 - 144 mmol/L   Potassium 3.9 3.5 - 5.2 mmol/L   Chloride 101 96 - 106 mmol/L   CO2 23 20 - 29 mmol/L   Calcium 9.6 8.7 - 10.2 mg/dL      Assessment & Plan:   1. Acute bronchitis, unspecified organism - azithromycin (ZITHROMAX Z-PAK) 250 MG tablet; Take as directed  Dispense: 6 each; Refill: 0 - benzonatate (TESSALON) 200 MG capsule; Take 1 capsule (200 mg total) by mouth 2 (two) times daily as needed for cough.  Dispense: 20 capsule; Refill: 0 - albuterol (PROVENTIL HFA;VENTOLIN HFA) 108 (90 Base) MCG/ACT inhaler; Inhale 2 puffs into the lungs every 6 (six) hours as needed for wheezing or shortness of breath.  Dispense: 1 Inhaler; Refill: 0   Continue all other maintenance medications as listed above.  Follow up plan: No follow-ups on file.  Educational handout given for [place] PA-C [place] [address]  [place], [place] [address] [phone]   09/16/2017, 10:08 AM

## 2017-12-10 ENCOUNTER — Ambulatory Visit: Payer: 59 | Admitting: Family Medicine

## 2017-12-25 ENCOUNTER — Encounter: Payer: Self-pay | Admitting: Nurse Practitioner

## 2017-12-25 ENCOUNTER — Ambulatory Visit: Payer: 59 | Admitting: Nurse Practitioner

## 2017-12-25 VITALS — BP 130/82 | HR 88 | Temp 98.0°F | Ht 65.0 in | Wt 226.0 lb

## 2017-12-25 DIAGNOSIS — R300 Dysuria: Secondary | ICD-10-CM

## 2017-12-25 DIAGNOSIS — N3001 Acute cystitis with hematuria: Secondary | ICD-10-CM | POA: Diagnosis not present

## 2017-12-25 LAB — MICROSCOPIC EXAMINATION: RENAL EPITHEL UA: NONE SEEN /HPF

## 2017-12-25 LAB — URINALYSIS, COMPLETE
Bilirubin, UA: NEGATIVE
GLUCOSE, UA: NEGATIVE
Ketones, UA: NEGATIVE
Nitrite, UA: NEGATIVE
PH UA: 7 (ref 5.0–7.5)
Specific Gravity, UA: 1.02 (ref 1.005–1.030)
UUROB: 1 mg/dL (ref 0.2–1.0)

## 2017-12-25 MED ORDER — NITROFURANTOIN MONOHYD MACRO 100 MG PO CAPS: 100.0000 mg | ORAL_CAPSULE | Freq: Two times a day (BID) | ORAL | 0 refills | Status: DC

## 2017-12-25 MED ORDER — PHENAZOPYRIDINE HCL 100 MG PO TABS
100.0000 mg | ORAL_TABLET | Freq: Three times a day (TID) | ORAL | 0 refills | Status: DC | PRN
Start: 2017-12-25 — End: 2021-04-12

## 2017-12-25 NOTE — Patient Instructions (Signed)
Urinary Tract Infection, Adult A urinary tract infection (UTI) is an infection of any part of the urinary tract. The urinary tract includes:  The kidneys.  The ureters.  The bladder.  The urethra. These organs make, store, and get rid of pee (urine) in the body. What are the causes? This is caused by germs (bacteria) in your genital area. These germs grow and cause swelling (inflammation) of your urinary tract. What increases the risk? You are more likely to develop this condition if:  You have a small, thin tube (catheter) to drain pee.  You cannot control when you pee or poop (incontinence).  You are female, and: ? You use these methods to prevent pregnancy: ? A medicine that kills sperm (spermicide). ? A device that blocks sperm (diaphragm). ? You have low levels of a female hormone (estrogen). ? You are pregnant.  You have genes that add to your risk.  You are sexually active.  You take antibiotic medicines.  You have trouble peeing because of: ? A prostate that is bigger than normal, if you are female. ? A blockage in the part of your body that drains pee from the bladder (urethra). ? A kidney stone. ? A nerve condition that affects your bladder (neurogenic bladder). ? Not getting enough to drink. ? Not peeing often enough.  You have other conditions, such as: ? Diabetes. ? A weak disease-fighting system (immune system). ? Sickle cell disease. ? Gout. ? Injury of the spine. What are the signs or symptoms? Symptoms of this condition include:  Needing to pee right away (urgently).  Peeing often.  Peeing small amounts often.  Pain or burning when peeing.  Blood in the pee.  Pee that smells bad or not like normal.  Trouble peeing.  Pee that is cloudy.  Fluid coming from the vagina, if you are female.  Pain in the belly or lower back. Other symptoms include:  Throwing up (vomiting).  No urge to eat.  Feeling mixed up (confused).  Being tired  and grouchy (irritable).  A fever.  Watery poop (diarrhea). How is this treated? This condition may be treated with:  Antibiotic medicine.  Other medicines.  Drinking enough water. Follow these instructions at home:  Medicines  Take over-the-counter and prescription medicines only as told by your doctor.  If you were prescribed an antibiotic medicine, take it as told by your doctor. Do not stop taking it even if you start to feel better. General instructions  Make sure you: ? Pee until your bladder is empty. ? Do not hold pee for a long time. ? Empty your bladder after sex. ? Wipe from front to back after pooping if you are a female. Use each tissue one time when you wipe.  Drink enough fluid to keep your pee pale yellow.  Keep all follow-up visits as told by your doctor. This is important. Contact a doctor if:  You do not get better after 1-2 days.  Your symptoms go away and then come back. Get help right away if:  You have very bad back pain.  You have very bad pain in your lower belly.  You have a fever.  You are sick to your stomach (nauseous).  You are throwing up. Summary  A urinary tract infection (UTI) is an infection of any part of the urinary tract.  This condition is caused by germs in your genital area.  There are many risk factors for a UTI. These include having a small, thin   tube to drain pee and not being able to control when you pee or poop.  Treatment includes antibiotic medicines for germs.  Drink enough fluid to keep your pee pale yellow. This information is not intended to replace advice given to you by your health care provider. Make sure you discuss any questions you have with your health care provider. Document Released: 06/11/2007 Document Revised: 07/02/2017 Document Reviewed: 07/02/2017 Elsevier Interactive Patient Education  2019 [employer]  

## 2017-12-25 NOTE — Progress Notes (Signed)
   Subjective:    Patient ID: Alison Ramsey, female    DOB: Jun 23, 1970, 47 y.o.   MRN: [id number]   Chief Complaint: Dysuria and Back Pain   HPI Patient in today c/o dysuria  That stated several days ago. Started having urgency and frequency yesterday. Developed a fever last night.   Review of Systems  Constitutional: Positive for chills and fever.  HENT: Negative.   Respiratory: Negative.   Cardiovascular: Negative.   Gastrointestinal: Negative.   Genitourinary: Positive for dysuria, frequency and urgency.  Neurological: Negative.   Psychiatric/Behavioral: Negative.   All other systems reviewed and are negative.      Objective:   Physical Exam Constitutional:      Appearance: She is obese.  Cardiovascular:     Rate and Rhythm: Normal rate and regular rhythm.  Pulmonary:     Effort: Pulmonary effort is normal.     Breath sounds: Normal breath sounds.  Abdominal:     General: Abdomen is flat. Bowel sounds are normal.  Genitourinary:    Comments: NO CVA tenderness Skin:    General: Skin is warm and dry.  Neurological:     General: No focal deficit present.     Mental Status: She is alert and oriented to person, place, and time.  Psychiatric:        Mood and Affect: Mood normal.        Behavior: Behavior normal.    BP 130/82   Pulse 88   Temp 98 F (36.7 C) (Oral)   Ht 5\' 5"  (1.651 m)   Wt 226 lb (102.5 kg)   BMI 37.61 kg/m   UA- leuks (2+) blood (2+)       Assessment & Plan:  Alison Ramsey in today with chief complaint of Dysuria and Back Pain   1. Dysuria - Urinalysis, Complete  2. Acute cystitis with hematuria Take medication as prescribe Cotton underwear Take shower not bath Cranberry juice, yogurt Force fluids AZO over the counter X2 days Culture pending RTO prn  - Urine Culture - nitrofurantoin, macrocrystal-monohydrate, (MACROBID) 100 MG capsule; Take 1 capsule (100 mg total) by mouth 2 (two) times daily. 1 po BId  Dispense: 14  capsule; Refill: 0 - phenazopyridine (PYRIDIUM) 100 MG tablet; Take 1 tablet (100 mg total) by mouth 3 (three) times daily as needed for pain.  Dispense: 10 tablet; Refill: 0  Alison Ramsey Done, FNP

## 2017-12-31 LAB — URINE CULTURE

## 2018-01-01 DIAGNOSIS — Z1231 Encounter for screening mammogram for malignant neoplasm of breast: Secondary | ICD-10-CM | POA: Diagnosis not present

## 2018-01-19 ENCOUNTER — Other Ambulatory Visit: Payer: Self-pay | Admitting: Family

## 2018-01-19 DIAGNOSIS — N6489 Other specified disorders of breast: Secondary | ICD-10-CM

## 2018-02-02 ENCOUNTER — Ambulatory Visit (HOSPITAL_COMMUNITY): Payer: 59

## 2018-02-02 ENCOUNTER — Ambulatory Visit (HOSPITAL_COMMUNITY)
Admission: RE | Admit: 2018-02-02 | Discharge: 2018-02-02 | Disposition: A | Discharge status: Home / Self Care | Payer: 59 | Source: Ambulatory Visit | Attending: Family | Admitting: Family

## 2018-02-02 DIAGNOSIS — R928 Other abnormal and inconclusive findings on diagnostic imaging of breast: Secondary | ICD-10-CM | POA: Diagnosis not present

## 2018-02-02 DIAGNOSIS — N6489 Other specified disorders of breast: Secondary | ICD-10-CM | POA: Insufficient documentation

## 2018-02-02 LAB — HM MAMMOGRAPHY

## 2018-04-08 ENCOUNTER — Other Ambulatory Visit: Payer: Self-pay | Admitting: Family

## 2018-04-08 DIAGNOSIS — I10 Essential (primary) hypertension: Secondary | ICD-10-CM

## 2018-05-17 ENCOUNTER — Other Ambulatory Visit: Payer: Self-pay | Admitting: Family

## 2018-05-17 DIAGNOSIS — I10 Essential (primary) hypertension: Secondary | ICD-10-CM

## 2018-05-18 NOTE — Telephone Encounter (Signed)
[place] NTBS 30 days given 04/09/18

## 2018-05-18 NOTE — Telephone Encounter (Signed)
Patient aware NTBS and will call back to schedule apt.

## 2019-03-08 ENCOUNTER — Encounter: Payer: Self-pay | Admitting: Family Medicine

## 2019-03-08 ENCOUNTER — Telehealth (INDEPENDENT_AMBULATORY_CARE_PROVIDER_SITE_OTHER): Payer: 59 | Admitting: Family Medicine

## 2019-03-08 DIAGNOSIS — B9689 Other specified bacterial agents as the cause of diseases classified elsewhere: Secondary | ICD-10-CM

## 2019-03-08 DIAGNOSIS — B3731 Acute candidiasis of vulva and vagina: Secondary | ICD-10-CM

## 2019-03-08 DIAGNOSIS — N3090 Cystitis, unspecified without hematuria: Secondary | ICD-10-CM

## 2019-03-08 DIAGNOSIS — B373 Candidiasis of vulva and vagina: Secondary | ICD-10-CM | POA: Diagnosis not present

## 2019-03-08 MED ORDER — FLUCONAZOLE 100 MG PO TABS: ORAL_TABLET | ORAL | 0 refills | Status: DC

## 2019-03-08 MED ORDER — SULFAMETHOXAZOLE-TRIMETHOPRIM 800-160 MG PO TABS: 1.0000 | ORAL_TABLET | Freq: Two times a day (BID) | ORAL | 0 refills | Status: DC

## 2019-03-08 NOTE — Progress Notes (Signed)
Subjective:    Patient ID: Alison Ramsey, female    DOB: November 30, 1970, 49 y.o.   MRN: [id number]   HPI: Alison Ramsey is a 49 y.o. female presenting for burning with urination and frequency for several days. Denies fever . No flank pain. No nausea, vomiting.Pain in lower abdomen. Concerned about yeast as well. [place] vaginally as well. Onset 4 days ago with urinary symptoms. The discharge started this AM.  .     Depression screen [facility] 2/9 12/25/2017 09/16/2017 05/19/2017 04/02/2017 03/05/2017  Decreased Interest 0 0 0 0 0  Down, Depressed, Hopeless 0 0 0 0 0  PHQ - 2 Score 0 0 0 0 0  Altered sleeping - - - - -  Tired, decreased energy - - - - -  Change in appetite - - - - -  Feeling bad or failure about yourself  - - - - -  Trouble concentrating - - - - -  Moving slowly or fidgety/restless - - - - -  Suicidal thoughts - - - - -  PHQ-9 Score - - - - -     Relevant past medical, surgical, family and social history reviewed and updated as indicated.  Interim medical history since our last visit reviewed. Allergies and medications reviewed and updated.  ROS:  Review of Systems  Constitutional: Negative for chills, diaphoresis and fever.  HENT: Negative for congestion.   Eyes: Negative for visual disturbance.  Respiratory: Negative for cough and shortness of breath.   Cardiovascular: Negative for chest pain and palpitations.  Gastrointestinal: Negative for constipation, diarrhea and nausea.  Genitourinary: Positive for dysuria, frequency and urgency. Negative for decreased urine volume, flank pain, hematuria, menstrual problem and pelvic pain.  Musculoskeletal: Negative for arthralgias and joint swelling.  Skin: Negative for rash.  Neurological: Negative for dizziness and numbness.     Social History   Tobacco Use  Smoking Status Never Smoker  Smokeless Tobacco Never Used       Objective:     Wt Readings from Last 3 Encounters:  12/25/17 226 lb (102.5 kg)    09/16/17 217 lb (98.4 kg)  05/19/17 227 lb 9.6 oz (103.2 kg)     Exam deferred. Pt. Harboring due to COVID 19. Video visit performed.  Physical Exam Constitutional:      General: She is not in acute distress.    Appearance: Normal appearance. She is not toxic-appearing.  HENT:     Head: Normocephalic.  Neurological:     General: No focal deficit present.     Mental Status: She is alert.  Psychiatric:        Mood and Affect: Mood normal.        Behavior: Behavior normal.      Assessment & Plan:   1. Cystitis   2. Candida vaginitis     Meds ordered this encounter  Medications  . fluconazole (DIFLUCAN) 100 MG tablet    Sig: Take two with first dose. Then starting the next day take one daily until all are taken.    Dispense:  15 tablet    Refill:  0  . sulfamethoxazole-trimethoprim (BACTRIM DS) 800-160 MG tablet    Sig: Take 1 tablet by mouth 2 (two) times daily.    Dispense:  14 tablet    Refill:  0    No orders of the defined types were placed in this encounter.     Diagnoses and all orders for this visit:  Cystitis  Candida vaginitis  Other orders -     fluconazole (DIFLUCAN) 100 MG tablet; Take two with first dose. Then starting the next day take one daily until all are taken. -     sulfamethoxazole-trimethoprim (BACTRIM DS) 800-160 MG tablet; Take 1 tablet by mouth 2 (two) times daily.    Virtual Visit via telephone Note  I discussed the limitations, risks, security and privacy concerns of performing an evaluation and management service by telephone and the availability of in person appointments. The patient was identified with two identifiers. Pt.expressed understanding and agreed to proceed. Pt. Is at home. Dr. Livia Snellen is in his office.  Follow Up Instructions:   I discussed the assessment and treatment plan with the patient. The patient was provided an opportunity to ask questions and all were answered. The patient agreed with the plan and  demonstrated an understanding of the instructions.   The patient was advised to call back or seek an in-person evaluation if the symptoms worsen or if the condition fails to improve as anticipated.   Total minutes including chart review and phone contact time: 18   Follow up plan: Return if symptoms worsen or fail to improve.  Claretta Fraise, MD [place]

## 2020-07-12 ENCOUNTER — Ambulatory Visit (INDEPENDENT_AMBULATORY_CARE_PROVIDER_SITE_OTHER): Payer: 59 | Admitting: Family Medicine

## 2020-07-12 ENCOUNTER — Encounter: Payer: Self-pay | Admitting: Family Medicine

## 2020-07-12 DIAGNOSIS — T7840XA Allergy, unspecified, initial encounter: Secondary | ICD-10-CM

## 2020-07-12 DIAGNOSIS — R220 Localized swelling, mass and lump, head: Secondary | ICD-10-CM

## 2020-07-12 MED ORDER — EPINEPHRINE 0.3 MG/0.3ML IJ SOAJ: 0.3000 mg | INTRAMUSCULAR | 2 refills | Status: DC | PRN

## 2020-07-12 NOTE — Progress Notes (Signed)
Virtual Visit  Note Due to COVID-19 pandemic this visit was conducted virtually. This visit type was conducted due to national recommendations for restrictions regarding the COVID-19 Pandemic (e.g. social distancing, sheltering in place) in an effort to limit this patient's exposure and mitigate transmission in our community. All issues noted in this document were discussed and addressed.  A physical exam was not performed with this format.  I connected with Alison Ramsey on 07/12/20 at [phone] by telephone and verified that I am speaking with the correct person using two identifiers. Alison Ramsey is currently located at home and no one is currently with her during the visit. The provider, Gwenlyn Perking, FNP is located in their office at time of visit.  I discussed the limitations, risks, security and privacy concerns of performing an evaluation and management service by telephone and the availability of in person appointments. I also discussed with the patient that there may be a patient responsible charge related to this service. The patient expressed understanding and agreed to proceed.  CC: allergic reaction  History and Present Illness:  HPI [name] had an allergic reaction to [place] yesterday while at work. She has had reactions to this in the past when she has come in contact with it. Yesterday after contact with the [place] she felt dizzy and short of breath. Her legs became very swollen and she developed a red bumpy rash on them. She also had swelling of her tongue and lips. She took some benadryl and this improved her shortness of breath and dizziness. She denies swelling of her face, tongue, mouth, lips or throat currently. Denies shortness of breath or chest pain currently. She does still have a rash on her legs and is itchy and burning. She wonders if she needs a epi pen. She would also like to have a note for work to state that she should avoid [place] grass due to  allergic reaction.     ROS As per HPI.   Observations/Objective: Alert and oriented x 3. Able to speak in full sentences without difficulty.    Assessment and Plan: Alison Ramsey was seen today for rash.  Diagnoses and all orders for this visit:  Allergic reaction, initial encounter Facial swelling Reaction to [place] yesterday at work with shortness of breath, rash, and swelling of tongue. Symptoms other than rash have resolved at this point. Declined prednisone as she does not tolerate this well. Continue benadryl or zyrtec. Hydrocortisone cream for itching. Rx for epi pen given. Work note provided. Discussed when to seek emergency care.  -     EPINEPHrine 0.3 mg/0.3 mL IJ SOAJ injection; Inject 0.3 mg into the muscle as needed for anaphylaxis.    Follow Up Instructions: Return to office for new or worsening symptoms, or if symptoms persist.     I discussed the assessment and treatment plan with the patient. The patient was provided an opportunity to ask questions and all were answered. The patient agreed with the plan and demonstrated an understanding of the instructions.   The patient was advised to call back or seek an in-person evaluation if the symptoms worsen or if the condition fails to improve as anticipated.  The above assessment and management plan was discussed with the patient. The patient verbalized understanding of and has agreed to the management plan. Patient is aware to call the clinic if symptoms persist or worsen. Patient is aware when to return to the clinic for a follow-up visit. Patient  educated on when it is appropriate to go to the emergency department.   Time call ended:  0831  I provided 12 minutes of  non face-to-face time during this encounter.    Gwenlyn Perking, FNP

## 2020-08-15 ENCOUNTER — Encounter: Payer: Self-pay | Admitting: Family Medicine

## 2021-04-12 ENCOUNTER — Other Ambulatory Visit: Payer: Self-pay

## 2021-04-12 ENCOUNTER — Emergency Department (HOSPITAL_COMMUNITY): Payer: Self-pay

## 2021-04-12 ENCOUNTER — Inpatient Hospital Stay (HOSPITAL_COMMUNITY)
Admission: EM | Admit: 2021-04-12 | Discharge: 2021-04-18 | DRG: 330 | Disposition: A | Discharge status: Home / Self Care | Payer: Self-pay | Attending: General Surgery | Admitting: General Surgery

## 2021-04-12 ENCOUNTER — Encounter (HOSPITAL_COMMUNITY): Payer: Self-pay | Admitting: *Deleted

## 2021-04-12 DIAGNOSIS — K56609 Unspecified intestinal obstruction, unspecified as to partial versus complete obstruction: Secondary | ICD-10-CM | POA: Diagnosis present

## 2021-04-12 DIAGNOSIS — D490 Neoplasm of unspecified behavior of digestive system: Secondary | ICD-10-CM | POA: Diagnosis present

## 2021-04-12 DIAGNOSIS — C186 Malignant neoplasm of descending colon: Principal | ICD-10-CM | POA: Diagnosis present

## 2021-04-12 DIAGNOSIS — K589 Irritable bowel syndrome without diarrhea: Secondary | ICD-10-CM | POA: Diagnosis present

## 2021-04-12 DIAGNOSIS — R188 Other ascites: Secondary | ICD-10-CM | POA: Diagnosis present

## 2021-04-12 DIAGNOSIS — E876 Hypokalemia: Secondary | ICD-10-CM | POA: Diagnosis not present

## 2021-04-12 DIAGNOSIS — Z91040 Latex allergy status: Secondary | ICD-10-CM

## 2021-04-12 DIAGNOSIS — Z886 Allergy status to analgesic agent status: Secondary | ICD-10-CM

## 2021-04-12 DIAGNOSIS — Z800 Family history of malignant neoplasm of digestive organs: Secondary | ICD-10-CM

## 2021-04-12 DIAGNOSIS — K56601 Complete intestinal obstruction, unspecified as to cause: Secondary | ICD-10-CM | POA: Diagnosis present

## 2021-04-12 DIAGNOSIS — R1084 Generalized abdominal pain: Principal | ICD-10-CM

## 2021-04-12 DIAGNOSIS — I10 Essential (primary) hypertension: Secondary | ICD-10-CM | POA: Diagnosis present

## 2021-04-12 DIAGNOSIS — Z8249 Family history of ischemic heart disease and other diseases of the circulatory system: Secondary | ICD-10-CM

## 2021-04-12 DIAGNOSIS — Z885 Allergy status to narcotic agent status: Secondary | ICD-10-CM

## 2021-04-12 DIAGNOSIS — Z79899 Other long term (current) drug therapy: Secondary | ICD-10-CM

## 2021-04-12 LAB — COMPREHENSIVE METABOLIC PANEL
ALT: 17 U/L (ref 0–44)
AST: 16 U/L (ref 15–41)
Albumin: 4.6 g/dL (ref 3.5–5.0)
Alkaline Phosphatase: 72 U/L (ref 38–126)
Anion gap: 8 (ref 5–15)
BUN: 14 mg/dL (ref 6–20)
CO2: 23 mmol/L (ref 22–32)
Calcium: 9.3 mg/dL (ref 8.9–10.3)
Chloride: 105 mmol/L (ref 98–111)
Creatinine, Ser: 0.84 mg/dL (ref 0.44–1.00)
GFR, Estimated: >60 mL/min (ref >60)
Glucose, Bld: 118 mg/dL — ABNORMAL HIGH (ref 70–99)
Potassium: 3.3 mmol/L — ABNORMAL LOW (ref 3.5–5.1)
Sodium: 136 mmol/L (ref 135–145)
Total Bilirubin: 0.6 mg/dL (ref 0.3–1.2)
Total Protein: 8.1 g/dL (ref 6.5–8.1)

## 2021-04-12 LAB — URINALYSIS, ROUTINE W REFLEX MICROSCOPIC
Bacteria, UA: NONE SEEN
Bilirubin Urine: NEGATIVE
Glucose, UA: NEGATIVE mg/dL
Hgb urine dipstick: NEGATIVE
Ketones, ur: 5 mg/dL — AB
Leukocytes,Ua: NEGATIVE
Nitrite: NEGATIVE
Protein, ur: 30 mg/dL — AB
Specific Gravity, Urine: 1.028 (ref 1.005–1.030)
pH: 5 (ref 5.0–8.0)

## 2021-04-12 LAB — CBC WITH DIFFERENTIAL/PLATELET
Abs Immature Granulocytes: 0.04 10*3/uL (ref 0.00–0.07)
Basophils Absolute: 0 10*3/uL (ref 0.0–0.1)
Basophils Relative: 0 %
Eosinophils Absolute: 0.1 10*3/uL (ref 0.0–0.5)
Eosinophils Relative: 1 %
HCT: 42.7 % (ref 36.0–46.0)
Hemoglobin: 14.9 g/dL (ref 12.0–15.0)
Immature Granulocytes: 0 %
Lymphocytes Relative: 9 %
Lymphs Abs: 1.2 10*3/uL (ref 0.7–4.0)
MCH: 28.4 pg (ref 26.0–34.0)
MCHC: 34.9 g/dL (ref 30.0–36.0)
MCV: 81.5 fL (ref 80.0–100.0)
Monocytes Absolute: 0.5 10*3/uL (ref 0.1–1.0)
Monocytes Relative: 4 %
Neutro Abs: 11 10*3/uL — ABNORMAL HIGH (ref 1.7–7.7)
Neutrophils Relative %: 86 %
Platelets: 355 10*3/uL (ref 150–400)
RBC: 5.24 MIL/uL — ABNORMAL HIGH (ref 3.87–5.11)
RDW: 14 % (ref 11.5–15.5)
WBC: 12.9 10*3/uL — ABNORMAL HIGH (ref 4.0–10.5)
nRBC: 0 % (ref 0.0–0.2)

## 2021-04-12 LAB — LACTIC ACID, PLASMA: Lactic Acid, Venous: 0.9 mmol/L (ref 0.5–1.9)

## 2021-04-12 LAB — TROPONIN I (HIGH SENSITIVITY): Troponin I (High Sensitivity): <2 ng/L (ref <18)

## 2021-04-12 LAB — D-DIMER, QUANTITATIVE: D-Dimer, Quant: 3.28 ug/mL-FEU — ABNORMAL HIGH (ref 0.00–0.50)

## 2021-04-12 LAB — LIPASE, BLOOD: Lipase: 26 U/L (ref 11–51)

## 2021-04-12 MED ORDER — LACTATED RINGERS IV SOLN: INTRAVENOUS | Status: DC

## 2021-04-12 MED ORDER — ONDANSETRON HCL 4 MG PO TABS
4.0000 mg | ORAL_TABLET | Freq: Four times a day (QID) | ORAL | Status: DC | PRN
  Administered 2021-04-13: 4 mg via ORAL
  Filled 2021-04-12: qty 1

## 2021-04-12 MED ORDER — HYDROMORPHONE HCL 1 MG/ML IJ SOLN
0.5000 mg | INTRAMUSCULAR | Status: DC | PRN
  Administered 2021-04-12 – 2021-04-15 (×8): 0.5 mg via INTRAVENOUS
  Administered 2021-04-15: 1 mg via INTRAVENOUS
  Administered 2021-04-15: 0.5 mg via INTRAVENOUS
  Administered 2021-04-15: 1 mg via INTRAVENOUS
  Filled 2021-04-12: qty 1
  Filled 2021-04-12: qty 0.5
  Filled 2021-04-12: qty 1
  Filled 2021-04-12: qty 0.5
  Filled 2021-04-12: qty 1
  Filled 2021-04-12: qty 0.5
  Filled 2021-04-12: qty 1
  Filled 2021-04-12 (×4): qty 0.5

## 2021-04-12 MED ORDER — ONDANSETRON HCL 4 MG/2ML IJ SOLN
4.0000 mg | Freq: Four times a day (QID) | INTRAMUSCULAR | Status: DC | PRN
  Administered 2021-04-12 – 2021-04-15 (×3): 4 mg via INTRAVENOUS
  Filled 2021-04-12 (×5): qty 2

## 2021-04-12 MED ORDER — ACETAMINOPHEN 325 MG PO TABS
650.0000 mg | ORAL_TABLET | Freq: Four times a day (QID) | ORAL | Status: DC | PRN
  Administered 2021-04-13 – 2021-04-14 (×2): 650 mg via ORAL
  Filled 2021-04-12 (×2): qty 2

## 2021-04-12 MED ORDER — OXYCODONE-ACETAMINOPHEN 5-325 MG PO TABS
1.0000 | ORAL_TABLET | Freq: Once | ORAL | Status: AC
  Administered 2021-04-12: 1 via ORAL
  Filled 2021-04-12: qty 1

## 2021-04-12 MED ORDER — ONDANSETRON HCL 4 MG/2ML IJ SOLN
4.0000 mg | Freq: Once | INTRAMUSCULAR | Status: AC
  Administered 2021-04-12: 4 mg via INTRAVENOUS
  Filled 2021-04-12: qty 2

## 2021-04-12 MED ORDER — HYDRALAZINE HCL 20 MG/ML IJ SOLN
10.0000 mg | INTRAMUSCULAR | Status: DC | PRN
  Administered 2021-04-12 – 2021-04-15 (×6): 10 mg via INTRAVENOUS
  Filled 2021-04-12 (×6): qty 1

## 2021-04-12 MED ORDER — ACETAMINOPHEN 650 MG RE SUPP: 650.0000 mg | Freq: Four times a day (QID) | RECTAL | Status: DC | PRN

## 2021-04-12 MED ORDER — IOHEXOL 350 MG/ML SOLN
100.0000 mL | Freq: Once | INTRAVENOUS | Status: AC | PRN
  Administered 2021-04-12: 100 mL via INTRAVENOUS

## 2021-04-12 NOTE — H&P (Signed)
?History and Physical  ? ? ?Patient: Alison Ramsey:[id number] DOB: [birth year] ?DOA: 04/12/2021 ?DOS: the patient was seen and examined on 04/12/2021 ?PCP: Sharion Balloon, FNP  ?Patient coming from: Home ? ?Chief Complaint:  ?Chief Complaint  ?Patient presents with  ? Abdominal Pain  ? ?HPI: Alison Ramsey is a 51 y.o. female with medical history significant of hypertension and family history of colon cancer.  She presents with 2 weeks of intermittent cramping periumbilical abdominal pain with nausea and vomiting that started earlier today.  Patient does have a history of IBS and thought that this was her normal IBS symptoms.  Her husband convinced her to come to the hospital for evaluation.  She did notice a small amount of blood in her stool within this past week.  She has been having some diarrhea.  There has not been any palliating or provoking factors.  She has not been tolerating oral food or liquids at this moment.  She denies fevers, chills, chest pain, shortness of breath, headaches. ? ?Review of Systems: As mentioned in the history of present illness. All other systems reviewed and are negative. ?Past Medical History:  ?Diagnosis Date  ? Hypertension   ? Lyme disease 2018  ? ?Past Surgical History:  ?Procedure Laterality Date  ? ABDOMINAL HYSTERECTOMY  2013  ? WISDOM TOOTH EXTRACTION    ? ?Social History:  reports that she has never smoked. She has never used smokeless tobacco. She reports current alcohol use. She reports that she does not use drugs. ? ?Allergies  ?Allergen Reactions  ? Aleve [Naproxen] Hives  ?  Ok with ibuprofen  ? Codeine Nausea And Vomiting and Rash  ?  hives  ? Latex Rash  ? ? ?Family History  ?Problem Relation Age of Onset  ? Kidney disease Mother   ? Hypertension Mother   ? Stroke Father   ? ? ?Prior to Admission medications   ?Medication Sig Start Date End Date Taking? Authorizing Provider  ?acetaminophen (TYLENOL) 500 MG tablet Take 1,000 mg by mouth every 6 (six) hours as  needed for mild pain or headache.   Yes [provider]  ?ASHWAGANDHA PO Take 1 tablet by mouth daily.   Yes [provider]  ?Digestive Enzymes (MULTI-ENZYME PO) Take 1 tablet by mouth daily.   Yes [provider]  ?EPINEPHrine 0.3 mg/0.3 mL IJ SOAJ injection Inject 0.3 mg into the muscle as needed for anaphylaxis. 07/12/20  Yes Gwenlyn Perking, FNP  ?FIBER PO Take 1 tablet by mouth daily.   Yes [provider]  ?ibuprofen (ADVIL) 200 MG tablet Take 400 mg by mouth every 6 (six) hours as needed for headache or mild pain.   Yes [provider]  ?Lactobacillus (ACIDOPHILUS PO) Take 1 capsule by mouth daily.   Yes [provider]  ?MAGNESIUM PO Take 1 tablet by mouth daily.   Yes [provider]  ?Multiple Vitamins-Minerals (ZINC PO) Take 1 tablet by mouth daily.   Yes [provider]  ?VITAMIN D PO Take 1 tablet by mouth daily.   Yes [provider]  ?albuterol (PROVENTIL HFA;VENTOLIN HFA) 108 (90 Base) MCG/ACT inhaler Inhale 2 puffs into the lungs every 6 (six) hours as needed for wheezing or shortness of breath. ?Patient not taking: Reported on 04/12/2021 09/16/17   Terald Sleeper, PA-C  ?hydrochlorothiazide (HYDRODIURIL) 25 MG tablet Take 1 tablet (25 mg total) by mouth daily. (Needs to be seen before next refill) ?Patient not taking: Reported  on 04/12/2021 04/09/18   Sharion Balloon, FNP  ? ? ?Physical Exam: ?Vitals:  ? 04/12/21 1630 04/12/21 1700 04/12/21 1747 04/12/21 1830  ?BP: (!) 181/117 (!) 184/107 (!) 185/114 (!) 174/98  ?Pulse: 86 82 81 81  ?Resp: 20 (!) 22 (!) 21 17  ?Temp:      ?TempSrc:      ?SpO2: 100% 97% 98% 99%  ?Weight:      ?Height:      ? ?General: Middle-age female. Awake and alert and oriented x3. No acute cardiopulmonary distress.  ?HEENT: Normocephalic atraumatic.  Right and left ears normal in appearance.  Pupils equal, round, reactive to light. Extraocular muscles are intact. Sclerae anicteric and noninjected.   Moist mucosal membranes. No mucosal lesions.  ?Neck: Neck supple without lymphadenopathy. No carotid bruits. No masses palpated.  ?Cardiovascular: Regular rate with normal S1-S2 sounds. No murmurs, rubs, gallops auscultated. No JVD.  ?Respiratory: Good respiratory effort with no wheezes, rales, rhonchi. Lungs clear to auscultation bilaterally.  No accessory muscle use. ?Abdomen: Soft.  Periumbilical tenderness to palpation.  No rebound tenderness or guarding. Nondistended. Active bowel sounds. No masses or hepatosplenomegaly  ?Skin: No rashes, lesions, or ulcerations.  Dry, warm to touch. 2+ dorsalis pedis and radial pulses. ?Musculoskeletal: No calf or leg pain. All major joints not erythematous nontender.  No upper or lower joint deformation.  Good ROM.  No contractures  ?Psychiatric: Intact judgment and insight. Pleasant and cooperative. ?Neurologic: No focal neurological deficits. Strength is 5/5 and symmetric in upper and lower extremities.  Cranial nerves II through XII are grossly intact. ? ?Data Reviewed: ?Results for orders placed or performed during the hospital encounter of 04/12/21 (from the past 24 hour(s))  ?CBC with Differential     Status: Abnormal  ? Collection Time: 04/12/21  4:02 PM  ?Result Value Ref Range  ? WBC 12.9 (H) 4.0 - 10.5 K/uL  ? RBC 5.24 (H) 3.87 - 5.11 MIL/uL  ? Hemoglobin 14.9 12.0 - 15.0 g/dL  ? HCT 42.7 36.0 - 46.0 %  ? MCV 81.5 80.0 - 100.0 fL  ? MCH 28.4 26.0 - 34.0 pg  ? MCHC 34.9 30.0 - 36.0 g/dL  ? RDW 14.0 11.5 - 15.5 %  ? Platelets 355 150 - 400 K/uL  ? nRBC 0.0 0.0 - 0.2 %  ? Neutrophils Relative % 86 %  ? Neutro Abs 11.0 (H) 1.7 - 7.7 K/uL  ? Lymphocytes Relative 9 %  ? Lymphs Abs 1.2 0.7 - 4.0 K/uL  ? Monocytes Relative 4 %  ? Monocytes Absolute 0.5 0.1 - 1.0 K/uL  ? Eosinophils Relative 1 %  ? Eosinophils Absolute 0.1 0.0 - 0.5 K/uL  ? Basophils Relative 0 %  ? Basophils Absolute 0.0 0.0 - 0.1 K/uL  ? Immature Granulocytes 0 %  ? Abs Immature Granulocytes 0.04 0.00 -  0.07 K/uL  ?Comprehensive metabolic panel     Status: Abnormal  ? Collection Time: 04/12/21  4:02 PM  ?Result Value Ref Range  ? Sodium 136 135 - 145 mmol/L  ? Potassium 3.3 (L) 3.5 - 5.1 mmol/L  ? Chloride 105 98 - 111 mmol/L  ? CO2 23 22 - 32 mmol/L  ? Glucose, Bld 118 (H) 70 - 99 mg/dL  ? BUN 14 6 - 20 mg/dL  ? Creatinine, Ser 0.84 0.44 - 1.00 mg/dL  ? Calcium 9.3 8.9 - 10.3 mg/dL  ? Total Protein 8.1 6.5 - 8.1 g/dL  ? Albumin 4.6 3.5 - 5.0 g/dL  ?  AST 16 15 - 41 U/L  ? ALT 17 0 - 44 U/L  ? Alkaline Phosphatase 72 38 - 126 U/L  ? Total Bilirubin 0.6 0.3 - 1.2 mg/dL  ? GFR, Estimated >60 >60 mL/min  ? Anion gap 8 5 - 15  ?Lipase, blood     Status: None  ? Collection Time: 04/12/21  4:02 PM  ?Result Value Ref Range  ? Lipase 26 11 - 51 U/L  ?Urinalysis, Routine w reflex microscopic Urine, Clean Catch     Status: Abnormal  ? Collection Time: 04/12/21  4:02 PM  ?Result Value Ref Range  ? Color, Urine YELLOW YELLOW  ? APPearance HAZY (A) CLEAR  ? Specific Gravity, Urine 1.028 1.005 - 1.030  ? pH 5.0 5.0 - 8.0  ? Glucose, UA NEGATIVE NEGATIVE mg/dL  ? Hgb urine dipstick NEGATIVE NEGATIVE  ? Bilirubin Urine NEGATIVE NEGATIVE  ? Ketones, ur 5 (A) NEGATIVE mg/dL  ? Protein, ur 30 (A) NEGATIVE mg/dL  ? Nitrite NEGATIVE NEGATIVE  ? Leukocytes,Ua NEGATIVE NEGATIVE  ? RBC / HPF 0-5 0 - 5 RBC/hpf  ? WBC, UA 0-5 0 - 5 WBC/hpf  ? Bacteria, UA NONE SEEN NONE SEEN  ? Squamous Epithelial / LPF 0-5 0 - 5  ? Mucus PRESENT   ? Ca Oxalate Crys, UA PRESENT   ?Troponin I (High Sensitivity)     Status: None  ? Collection Time: 04/12/21  4:02 PM  ?Result Value Ref Range  ? Troponin I (High Sensitivity) <2 <18 ng/L  ?D-dimer, quantitative     Status: Abnormal  ? Collection Time: 04/12/21  4:02 PM  ?Result Value Ref Range  ? D-Dimer, Quant 3.28 (H) 0.00 - 0.50 ug/mL-FEU  ?Lactic acid, plasma     Status: None  ? Collection Time: 04/12/21  6:53 PM  ?Result Value Ref Range  ? Lactic Acid, Venous 0.9 0.5 - 1.9 mmol/L  ? ?CT Angio Chest PE W  and/or Wo Contrast ? ?Result Date: 04/12/2021 ?CLINICAL DATA:  Sob, chest pain, general abd pain/cramping x 2 weeks, worse in last couple days. Elevated d-dimer. EXAM: CT ANGIOGRAPHY CHEST CT ABDOMEN AND PELVIS WITH C

## 2021-04-12 NOTE — ED Notes (Signed)
Pt transported to CT via stretcher at this time.  

## 2021-04-12 NOTE — ED Notes (Signed)
Per [place] PA, delta troponin not needed.  ?

## 2021-04-12 NOTE — ED Triage Notes (Signed)
ABDOMINAL PAIN X 2 WEEKS ?

## 2021-04-12 NOTE — ED Provider Notes (Signed)
?[facility] ?Provider Note ? ? ?CSN: [id number] ?Arrival date & time: 04/12/21  1528 ? ?  ? ?History ? ?Chief Complaint  ?Patient presents with  ? Abdominal Pain  ? ? ?Alison Ramsey is a 51 y.o. female with a past medical history of IBS, hypertension who presents to the Emergency Department complaining of generalized abdominal pain onset 2 weeks. She has been taking her IBS medications as prescribed. Pt notes that it feels like a ball to her abdomen and it feels distended to her. Has associated vomiting, diarrhea, chest pain, shortness of breath. She also notes that she has had blood in her stool. Has tried tylenol and ibuprofen with no relief of her symptoms. Patient notes that she has moved within the past week and has had a lot of heavy lifting. Denies fever, chills, constipation, hematuria, vaginal bleeding/discharge. Patient notes that she was taken off her antihypertensive (lisinopril) due to her blood pressure improvement. Denies excessive NSAID use, EtOH use, GERD, MI, cardiac catheterization, stents, DVT/PE, recent immobilization/surgeries, anticoagulant use, HRT. ? ?Per pt chart review: most recent family medicine note from 04/02/2017 showed that patients lisinopril was discontinued, however, patient was instructed to continue with her 25 mg HCTZ. Review of further of patient chart shows that patient requested a refill of her hydrochlorothiazide in May 2020 however it was refused at that time and indicated that patient needed a follow-up appointment before further prescriptions. ? ?The history is provided by the patient. No language interpreter was used.  ? ?  ? ?Home Medications ?Prior to Admission medications   ?Medication Sig Start Date End Date Taking? Authorizing Provider  ?acetaminophen (TYLENOL) 500 MG tablet Take 1,000 mg by mouth every 6 (six) hours as needed for mild pain or headache.   Yes [provider]  ?ASHWAGANDHA PO Take 1 tablet by mouth daily.   Yes [provider]  ?Digestive Enzymes (MULTI-ENZYME PO) Take 1 tablet by mouth daily.   Yes [provider]  ?EPINEPHrine 0.3 mg/0.3 mL IJ SOAJ injection Inject 0.3 mg into the muscle as needed for anaphylaxis. 07/12/20  Yes Gwenlyn Perking, FNP  ?FIBER PO Take 1 tablet by mouth daily.   Yes [provider]  ?ibuprofen (ADVIL) 200 MG tablet Take 400 mg by mouth every 6 (six) hours as needed for headache or mild pain.   Yes [provider]  ?Lactobacillus (ACIDOPHILUS PO) Take 1 capsule by mouth daily.   Yes [provider]  ?MAGNESIUM PO Take 1 tablet by mouth daily.   Yes [provider]  ?Multiple Vitamins-Minerals (ZINC PO) Take 1 tablet by mouth daily.   Yes [provider]  ?VITAMIN D PO Take 1 tablet by mouth daily.   Yes [provider]  ?albuterol (PROVENTIL HFA;VENTOLIN HFA) 108 (90 Base) MCG/ACT inhaler Inhale 2 puffs into the lungs every 6 (six) hours as needed for wheezing or shortness of breath. ?Patient not taking: Reported on 04/12/2021 09/16/17   Terald Sleeper, PA-C  ?hydrochlorothiazide (HYDRODIURIL) 25 MG tablet Take 1 tablet (25 mg total) by mouth daily. (Needs to be seen before next refill) ?Patient not taking: Reported on 04/12/2021 04/09/18   Sharion Balloon, FNP  ?   ? ?Allergies    ?Aleve [naproxen], Codeine, and Latex   ? ?Review of Systems   ?Review of Systems  ?Constitutional:  Negative for chills and fever.  ?Respiratory:  Positive for shortness of breath.   ?Cardiovascular:  Positive for chest pain.  ?  Gastrointestinal:  Positive for abdominal pain and diarrhea. Negative for constipation, nausea and vomiting.  ?Genitourinary:  Positive for dysuria. Negative for hematuria, vaginal bleeding and vaginal discharge.  ?All other systems reviewed and are negative. ? ?Physical Exam ?Updated Vital Signs ?BP (!) 206/116 (BP Location: Right Arm)   Pulse 91   Temp 98 ?F (36.7 ?C) (Oral)   Resp 14   Ht '5\' 5"'$  (1.651 m)   Wt 99.8 kg   SpO2 99%    BMI 36.61 kg/m?  ?Physical Exam ?Vitals and nursing note reviewed.  ?Constitutional:   ?   General: She is not in acute distress. ?   Appearance: She is not diaphoretic.  ?HENT:  ?   Head: Normocephalic and atraumatic.  ?   Mouth/Throat:  ?   Pharynx: No oropharyngeal exudate.  ?Eyes:  ?   General: No scleral icterus. ?   Conjunctiva/sclera: Conjunctivae normal.  ?Cardiovascular:  ?   Rate and Rhythm: Normal rate and regular rhythm.  ?   Pulses: Normal pulses.  ?   Heart sounds: Normal heart sounds.  ?Pulmonary:  ?   Effort: Pulmonary effort is normal. No respiratory distress.  ?   Breath sounds: Normal breath sounds. No wheezing.  ?Abdominal:  ?   General: Bowel sounds are normal.  ?   Palpations: Abdomen is soft. There is no mass.  ?   Tenderness: There is generalized abdominal tenderness. There is no guarding or rebound.  ?   Comments: Diffuse abdominal TTP.  ?Musculoskeletal:     ?   General: Normal range of motion.  ?   Cervical back: Normal range of motion and neck supple.  ?Skin: ?   General: Skin is warm and dry.  ?Neurological:  ?   Mental Status: She is alert.  ?Psychiatric:     ?   Behavior: Behavior normal.  ? ? ?ED Results / Procedures / Treatments   ?Labs ?(all labs ordered are listed, but only abnormal results are displayed) ?Labs Reviewed  ?CBC WITH DIFFERENTIAL/PLATELET - Abnormal; Notable for the following components:  ?    Result Value  ? WBC 12.9 (*)   ? RBC 5.24 (*)   ? Neutro Abs 11.0 (*)   ? All other components within normal limits  ?COMPREHENSIVE METABOLIC PANEL - Abnormal; Notable for the following components:  ? Potassium 3.3 (*)   ? Glucose, Bld 118 (*)   ? All other components within normal limits  ?URINALYSIS, ROUTINE W REFLEX MICROSCOPIC - Abnormal; Notable for the following components:  ? APPearance HAZY (*)   ? Ketones, ur 5 (*)   ? Protein, ur 30 (*)   ? All other components within normal limits  ?D-DIMER, QUANTITATIVE - Abnormal; Notable for the following components:  ? D-Dimer,  Quant 3.28 (*)   ? All other components within normal limits  ?LIPASE, BLOOD  ?TROPONIN I (HIGH SENSITIVITY)  ? ? ?EKG ?None ? ?Radiology ?CT Angio Chest PE W and/or Wo Contrast ? ?Result Date: 04/12/2021 ?CLINICAL DATA:  Sob, chest pain, general abd pain/cramping x 2 weeks, worse in last couple days. Elevated d-dimer. EXAM: CT ANGIOGRAPHY CHEST CT ABDOMEN AND PELVIS WITH CONTRAST TECHNIQUE: Multidetector CT imaging of the chest was performed using the standard protocol during bolus administration of intravenous contrast. Multiplanar CT image reconstructions and MIPs were obtained to evaluate the vascular anatomy. Multidetector CT imaging of the abdomen and pelvis was performed using the standard protocol during bolus administration of intravenous contrast. RADIATION DOSE REDUCTION: This  exam was performed according to the departmental dose-optimization program which includes automated exposure control, adjustment of the mA and/or kV according to patient size and/or use of iterative reconstruction technique. CONTRAST:  152m OMNIPAQUE IOHEXOL 350 MG/ML SOLN COMPARISON:  None. FINDINGS: CTA CHEST FINDINGS Cardiovascular: Satisfactory opacification of the pulmonary arteries to the segmental level. No evidence of pulmonary embolism. Normal heart size. No significant pericardial effusion. The thoracic aorta is normal in caliber. No atherosclerotic plaque of the thoracic aorta. No coronary artery calcifications. Mediastinum/Nodes: No enlarged mediastinal, hilar, or axillary lymph nodes. Thyroid gland, trachea, and esophagus demonstrate no significant findings. Pre-vascular 1.2 cm fluid density likely benign etiology. Lungs/Pleura: Expiratory phase of respiration. No focal consolidation. There is a 4 mm right upper lobe calcified nodule (6:43). There is a 5 mm right upper lobe pulmonary nodule (6:51). No pulmonary mass. No pleural effusion. No pneumothorax. Musculoskeletal: No chest wall abnormality. No suspicious lytic or  blastic osseous lesions. No acute displaced fracture. Review of the MIP images confirms the above findings. CT ABDOMEN and PELVIS FINDINGS Hepatobiliary: No focal liver abnormality. Couple of approximatel

## 2021-04-12 NOTE — ED Notes (Signed)
Portable Xray at bedside.

## 2021-04-13 ENCOUNTER — Other Ambulatory Visit: Payer: Self-pay

## 2021-04-13 ENCOUNTER — Encounter (HOSPITAL_COMMUNITY)
Admission: EM | Disposition: A | Discharge status: Home / Self Care | Payer: Self-pay | Source: Home / Self Care | Attending: General Surgery

## 2021-04-13 ENCOUNTER — Inpatient Hospital Stay (HOSPITAL_COMMUNITY): Payer: Self-pay | Admitting: Anesthesiology

## 2021-04-13 ENCOUNTER — Encounter (HOSPITAL_COMMUNITY): Payer: Self-pay | Admitting: Family Medicine

## 2021-04-13 DIAGNOSIS — K56691 Other complete intestinal obstruction: Secondary | ICD-10-CM

## 2021-04-13 DIAGNOSIS — K56699 Other intestinal obstruction unspecified as to partial versus complete obstruction: Secondary | ICD-10-CM

## 2021-04-13 HISTORY — PX: FLEXIBLE SIGMOIDOSCOPY: SHX5431

## 2021-04-13 HISTORY — PX: BIOPSY: SHX5522

## 2021-04-13 LAB — BASIC METABOLIC PANEL
Anion gap: 9 (ref 5–15)
BUN: 12 mg/dL (ref 6–20)
CO2: 24 mmol/L (ref 22–32)
Calcium: 9 mg/dL (ref 8.9–10.3)
Chloride: 105 mmol/L (ref 98–111)
Creatinine, Ser: 0.74 mg/dL (ref 0.44–1.00)
GFR, Estimated: >60 mL/min (ref >60)
Glucose, Bld: 165 mg/dL — ABNORMAL HIGH (ref 70–99)
Potassium: 3.6 mmol/L (ref 3.5–5.1)
Sodium: 138 mmol/L (ref 135–145)

## 2021-04-13 LAB — CBC
HCT: 40.5 % (ref 36.0–46.0)
Hemoglobin: 13.6 g/dL (ref 12.0–15.0)
MCH: 27.8 pg (ref 26.0–34.0)
MCHC: 33.6 g/dL (ref 30.0–36.0)
MCV: 82.7 fL (ref 80.0–100.0)
Platelets: 306 10*3/uL (ref 150–400)
RBC: 4.9 MIL/uL (ref 3.87–5.11)
RDW: 14 % (ref 11.5–15.5)
WBC: 13.7 10*3/uL — ABNORMAL HIGH (ref 4.0–10.5)
nRBC: 0 % (ref 0.0–0.2)

## 2021-04-13 SURGERY — SIGMOIDOSCOPY, FLEXIBLE
Anesthesia: General

## 2021-04-13 MED ORDER — PROPOFOL 10 MG/ML IV BOLUS
INTRAVENOUS | Status: DC | PRN
Start: 2021-04-13 — End: 2021-04-13
  Administered 2021-04-13: 200 mg via INTRAVENOUS

## 2021-04-13 MED ORDER — SUCCINYLCHOLINE CHLORIDE 200 MG/10ML IV SOSY
PREFILLED_SYRINGE | INTRAVENOUS | Status: DC | PRN
  Administered 2021-04-13: 140 mg via INTRAVENOUS

## 2021-04-13 MED ORDER — LACTATED RINGERS IV SOLN: INTRAVENOUS | Status: DC | PRN

## 2021-04-13 MED ORDER — ENOXAPARIN SODIUM 40 MG/0.4ML IJ SOSY
40.0000 mg | PREFILLED_SYRINGE | INTRAMUSCULAR | Status: DC
  Filled 2021-04-13: qty 0.4

## 2021-04-13 MED ORDER — SODIUM CHLORIDE 0.9 % IV SOLN: INTRAVENOUS | Status: DC

## 2021-04-13 MED ORDER — LABETALOL HCL 5 MG/ML IV SOLN
INTRAVENOUS | Status: AC
  Filled 2021-04-13: qty 4

## 2021-04-13 MED ORDER — LABETALOL HCL 5 MG/ML IV SOLN
10.0000 mg | INTRAVENOUS | Status: AC | PRN
  Administered 2021-04-13 (×2): 5 mg via INTRAVENOUS

## 2021-04-13 MED ORDER — FENTANYL CITRATE (PF) 100 MCG/2ML IJ SOLN
INTRAMUSCULAR | Status: AC
  Filled 2021-04-13: qty 2

## 2021-04-13 MED ORDER — FENTANYL CITRATE (PF) 100 MCG/2ML IJ SOLN
INTRAMUSCULAR | Status: DC | PRN
  Administered 2021-04-13: 100 ug via INTRAVENOUS

## 2021-04-13 MED ORDER — ONDANSETRON HCL 4 MG/2ML IJ SOLN
INTRAMUSCULAR | Status: AC
  Filled 2021-04-13: qty 2

## 2021-04-13 MED ORDER — SPOT INK MARKER SYRINGE KIT
PACK | SUBMUCOSAL | Status: DC | PRN
  Administered 2021-04-13: 2 mL via SUBMUCOSAL

## 2021-04-13 MED ORDER — DEXAMETHASONE SODIUM PHOSPHATE 4 MG/ML IJ SOLN
INTRAMUSCULAR | Status: DC | PRN
  Administered 2021-04-13: 8 mg via INTRAVENOUS

## 2021-04-13 MED ORDER — PROPOFOL 10 MG/ML IV BOLUS
INTRAVENOUS | Status: AC
  Filled 2021-04-13: qty 20

## 2021-04-13 MED ORDER — LIDOCAINE HCL (CARDIAC) PF 100 MG/5ML IV SOSY
PREFILLED_SYRINGE | INTRAVENOUS | Status: DC | PRN
  Administered 2021-04-13: 100 mg via INTRATRACHEAL

## 2021-04-13 NOTE — Progress Notes (Signed)
?PROGRESS NOTE ? ? ? Alison Ramsey  FOY:[id number] DOB: October 06, 1970 DOA: 04/12/2021 ?PCP: Sharion Balloon, FNP ? ? ?Brief Narrative:  ?Per HPI: ?Alison Ramsey is a 51 y.o. female with medical history significant of hypertension and family history of colon cancer.  She presents with 2 weeks of intermittent cramping periumbilical abdominal pain with nausea and vomiting that started earlier today.  Patient does have a history of IBS and thought that this was her normal IBS symptoms.  Her husband convinced her to come to the hospital for evaluation.  She did notice a small amount of blood in her stool within this past week.  She has been having some diarrhea.  There has not been any palliating or provoking factors.  She has not been tolerating oral food or liquids at this moment.  She denies fevers, chills, chest pain, shortness of breath, headaches. ? 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  She continues to have some ongoing abdominal pain.  GI and general surgery evaluation pending.  ? ? ?Assessment & Plan: ?  ?Principal Problem: ?  Bowel obstruction ([place]) ?Active Problems: ?  Hypertension ?  Colonic neoplasm ? ?Assessment and Plan: ? ? 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 to continue with intermittent n/v ?Continue IVF ?GI and general surgery consulted ?Check CA125, CEA ?  ?Hypertension ?Hydralazine 10 mg IV every 4 hours as needed elevated blood pressures ?  ? ?DVT prophylaxis: Lovenox ?Code Status: Full ?Family Communication: Husband at bedside 4/8 ?Disposition Plan:  ?Status is: Inpatient ?Remains inpatient appropriate because: IV fluids. ? ?Consultants:  ?GI ?General surgery ? ?Procedures:  ?None ? ?Antimicrobials:  ?None ? ? ?Subjective: ?Patient seen and evaluated today with ongoing abdominal pain and some nausea and vomiting with pain medications. No acute concerns or events noted overnight. ? ?Objective: ?Vitals:  ? 04/12/21  2045 04/13/21 0029 04/13/21 0300 04/13/21 0531  ?BP:  (!) 152/91 (!) 160/104 137/85  ?Pulse:  85 78 93  ?Resp:  18 16   ?Temp:  97.8 ?F (36.6 ?C) 97.9 ?F (36.6 ?C)   ?TempSrc:   Oral   ?SpO2: 98% 98% 95% 99%  ?Weight:      ?Height:      ? ? ?Intake/Output Summary (Last 24 hours) at 04/13/2021 1117 ?Last data filed at 04/13/2021 0900 ?Gross per 24 hour  ?Intake 848.65 ml  ?Output --  ?Net 848.65 ml  ? ?Filed Weights  ? 04/12/21 1541 04/12/21 2042  ?Weight: 99.8 kg 99.5 kg  ? ? ?Examination: ? ?General exam: Appears calm and comfortable  ?Respiratory system: Clear to auscultation. Respiratory effort normal. ?Cardiovascular system: S1 & S2 heard, RRR.  ?Gastrointestinal system: Abdomen is soft, minimally tender to palpation ?Central nervous system: Alert and awake ?Extremities: No edema ?Skin: No significant lesions noted ?Psychiatry: Flat affect. ? ? ? ?Data Reviewed: I have personally reviewed following labs and imaging studies ? ?CBC: ?Recent Labs  ?Lab 04/12/21 ?1602 04/13/21 ?7672  ?WBC 12.9* 13.7*  ?NEUTROABS 11.0*  --   ?HGB 14.9 13.6  ?HCT 42.7 40.5  ?MCV 81.5 82.7  ?PLT 355 306  ? ?Basic Metabolic Panel: ?Recent Labs  ?Lab 04/12/21 ?1602 04/13/21 ?0947  ?NA 136 138  ?K 3.3* 3.6  ?CL 105 105  ?CO2 23 24  ?GLUCOSE 118* 165*  ?BUN 14 12  ?CREATININE 0.84 0.74  ?CALCIUM 9.3 9.0  ? ?GFR: ?Estimated Creatinine Clearance: 98.3 mL/min (by C-G formula based on SCr of  0.74 mg/dL). ?Liver Function Tests: ?Recent Labs  ?Lab 04/12/21 ?1602  ?AST 16  ?ALT 17  ?ALKPHOS 72  ?BILITOT 0.6  ?PROT 8.1  ?ALBUMIN 4.6  ? ?Recent Labs  ?Lab 04/12/21 ?1602  ?LIPASE 26  ? ?No results for input(s): AMMONIA in the last 168 hours. ?Coagulation Profile: ?No results for input(s): INR, PROTIME in the last 168 hours. ?Cardiac Enzymes: ?No results for input(s): CKTOTAL, CKMB, CKMBINDEX, TROPONINI in the last 168 hours. ?BNP (last 3 results) ?No results for input(s): PROBNP in the last 8760 hours. ?HbA1C: ?No results for input(s): HGBA1C in the  last 72 hours. ?CBG: ?No results for input(s): GLUCAP in the last 168 hours. ?Lipid Profile: ?No results for input(s): CHOL, HDL, LDLCALC, TRIG, CHOLHDL, LDLDIRECT in the last 72 hours. ?Thyroid Function Tests: ?No results for input(s): TSH, T4TOTAL, FREET4, T3FREE, THYROIDAB in the last 72 hours. ?Anemia Panel: ?No results for input(s): VITAMINB12, FOLATE, FERRITIN, TIBC, IRON, RETICCTPCT in the last 72 hours. ?Sepsis Labs: ?Recent Labs  ?Lab 04/12/21 ?1853  ?LATICACIDVEN 0.9  ? ? ?No results found for this or any previous visit (from the past 240 hour(s)).  ? ? ? ? ? ?Radiology Studies: ?CT Angio Chest PE W and/or Wo Contrast ? ?Result Date: 04/12/2021 ?CLINICAL DATA:  Sob, chest pain, general abd pain/cramping x 2 weeks, worse in last couple days. Elevated d-dimer. EXAM: CT ANGIOGRAPHY CHEST CT ABDOMEN AND PELVIS WITH CONTRAST TECHNIQUE: Multidetector CT imaging of the chest was performed using the standard protocol during bolus administration of intravenous contrast. Multiplanar CT image reconstructions and MIPs were obtained to evaluate the vascular anatomy. Multidetector CT imaging of the abdomen and pelvis was performed using the standard protocol during bolus administration of intravenous contrast. RADIATION DOSE REDUCTION: This exam was performed according to the departmental dose-optimization program which includes automated exposure control, adjustment of the mA and/or kV according to patient size and/or use of iterative reconstruction technique. CONTRAST:  170m OMNIPAQUE IOHEXOL 350 MG/ML SOLN COMPARISON:  None. FINDINGS: CTA CHEST FINDINGS Cardiovascular: Satisfactory opacification of the pulmonary arteries to the segmental level. No evidence of pulmonary embolism. Normal heart size. No significant pericardial effusion. The thoracic aorta is normal in caliber. No atherosclerotic plaque of the thoracic aorta. No coronary artery calcifications. Mediastinum/Nodes: No enlarged mediastinal, hilar, or axillary  lymph nodes. Thyroid gland, trachea, and esophagus demonstrate no significant findings. Pre-vascular 1.2 cm fluid density likely benign etiology. Lungs/Pleura: Expiratory phase of respiration. No focal consolidation. There is a 4 mm right upper lobe calcified nodule (6:43). There is a 5 mm right upper lobe pulmonary nodule (6:51). No pulmonary mass. No pleural effusion. No pneumothorax. Musculoskeletal: No chest wall abnormality. No suspicious lytic or blastic osseous lesions. No acute displaced fracture. Review of the MIP images confirms the above findings. CT ABDOMEN and PELVIS FINDINGS Hepatobiliary: No focal liver abnormality. Couple of approximately 5 mm hyperdense foci along the gallbladder lumen (2:27, 30). No gallbladder wall thickening or pericholecystic fluid. No biliary dilatation. Pancreas: No focal lesion. Normal pancreatic contour. No surrounding inflammatory changes. No main pancreatic ductal dilatation. Spleen: Normal in size without focal abnormality. Adrenals/Urinary Tract: No adrenal nodule bilaterally. Bilateral kidneys enhance symmetrically. No definite nephrolithiasis.  No nephroureterolithiasis. No hydronephrosis. No hydroureter. The urinary bladder is unremarkable. Stomach/Bowel: Stomach is within normal limits. No evidence of small bowel wall thickening or dilatation. Fecalization material within the lumen of the terminal ileum consistent with a slow transition state. There is a 5 cm in length irregular bowel wall thickening of  distal descending colon with associated proximal increased stool burden throughout the colon as well as bowel wall thickening and pericolonic fat stranding. Appendix appears normal. Vascular/Lymphatic: No abdominal aorta or iliac aneurysm. Prominent but nonenlarged left lower quadrant cyst pericolonic mesenteric lymph nodes (2:50 5-59). No abdominal, pelvic, or inguinal lymphadenopathy. Reproductive: Status post hysterectomy. No adnexal masses. Other: Trace free fluid  within the pelvis. No intraperitoneal free gas. No organized fluid collection. Musculoskeletal: No abdominal wall hernia or abnormality. No suspicious lytic or blastic osseous lesions. No acute displaced

## 2021-04-13 NOTE — Op Note (Signed)
[facility] ?Patient Name: Alison Ramsey ?Procedure Date: 04/13/2021 2:09 PM ?MRN: [id number] ?Date of Birth: [birth year] ?Attending MD: Elon Alas. Abbey Chatters , DO ?CSN: [id number] ?Age: 51 ?Admit Type: Inpatient ?Procedure:                Flexible Sigmoidoscopy ?Indications:              Abnormal CT of the GI tract ?Providers:                Elon Alas. Abbey Chatters, DO, Lurline Del, RN, Ulice Dash  ?                          Blima Singer, Technician ?Referring MD:              ?Medicines:                See the Anesthesia note for documentation of the  ?                          administered medications ?Complications:            No immediate complications. ?Estimated Blood Loss:     Estimated blood loss was minimal. ?Procedure:                Pre-Anesthesia Assessment: ?                          - The anesthesia plan was to use general anesthesia. ?                          After obtaining informed consent, the scope was  ?                          passed under direct vision. The [id number]-[id number]  ?                          ([id number]) was introduced through the anus and  ?                          advanced to the the descending colon. The flexible  ?                          sigmoidoscopy was accomplished without difficulty.  ?                          The patient tolerated the procedure well. The  ?                          quality of the bowel preparation was fair. ?Scope In: 2:36:53 PM ?Scope Out: 2:44:46 PM ?Total Procedure Duration: 0 hours 7 minutes 53 seconds  ?Findings: ?     A frond-like/villous, fungating and infiltrative completely obstructing  ?     large mass was found in the descending colon. The mass was  ?     circumferential. Unable to advance pediatric colonoscope past lesion. No  ?     bleeding was present. Biopsies were taken with a cold forceps for  ?     histology. Area distal was tattooed with an  injection of 2 mL of Spot  ?     (carbon black). ?Impression:               - Preparation of the colon was fair. ?                           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                          the descending colon. Biopsied. ?Moderate Sedation: ?     Per Anesthesia Care ?Recommendation:           - Return patient to hospital ward for ongoing care. ?                          - NPO. Sips of water for comfort ?                          - Cased discussed with Dr. Constance Haw, plan for colon  ?                          resection on 04/15/21 with colostomy. Unable to prep  ?                          due to obstructive nature of mass. ?Procedure Code(s):        --- Professional --- ?                          [phone], Sigmoidoscopy, flexible; with biopsy, single  ?                          or multiple ?                          [id number], Sigmoidoscopy, flexible; with directed  ?                          submucosal injection(s), any substance ?Diagnosis Code(s):        --- Professional --- ?                          D49.0, Neoplasm of unspecified behavior of  ?                          digestive system ?                          K56.691, Other complete intestinal obstruction ?                          R93.3, Abnormal findings on diagnostic imaging of  ?                          other parts of digestive tract ?CPT copyright 2019 [employer]. All rights reserved. ?The codes documented in this report are preliminary and upon coder review may  ?be revised to meet current compliance  requirements. ?Elon Alas. Abbey Chatters, DO ?Elon Alas. [place], DO ?04/13/2021 2:49:30 PM ?This report has been signed electronically. ?Number of Addenda: 0 ?

## 2021-04-13 NOTE — Anesthesia Procedure Notes (Signed)
Procedure Name: Intubation ?Date/Time: 04/13/2021 2:30 PM ?Performed by: Denese Killings, MD ?Pre-anesthesia Checklist: Patient identified, Emergency Drugs available, Suction available and Patient being monitored ?Patient Re-evaluated:Patient Re-evaluated prior to induction ?Oxygen Delivery Method: Circle system utilized ?Preoxygenation: Pre-oxygenation with 100% oxygen ?Induction Type: IV induction and Rapid sequence ?Laryngoscope Size: Glidescope and 4 ?Grade View: Grade II ?Tube type: Oral ?Number of attempts: 1 ?Airway Equipment and Method: Stylet ?Placement Confirmation: ETT inserted through vocal cords under direct vision, positive ETCO2 and breath sounds checked- equal and bilateral ?Secured at: 20 cm ?Tube secured with: Tape ?Dental Injury: Teeth and Oropharynx as per pre-operative assessment  ? ? ? ? ?

## 2021-04-13 NOTE — Anesthesia Preprocedure Evaluation (Addendum)
Anesthesia Evaluation  ?Patient identified by MRN, date of birth, ID band ?Patient awake ? ? ? ?Reviewed: ?Allergy & Precautions, NPO status , Patient's Chart, lab work & pertinent test results ? ?Airway ?Mallampati: II ? ?TM Distance: >3 FB ?Neck ROM: Full ? ?Mouth opening: Limited Mouth Opening ? Dental ? ?(+) Dental Advisory Given ?NO NOTABLE DENTAL INJURY:   ?Pulmonary ?neg pulmonary ROS,  ?  ?Pulmonary exam normal ?breath sounds clear to auscultation ? ? ? ? ? ? Cardiovascular ?hypertension, Pt. on medications ?Normal cardiovascular exam ?Rhythm:Regular Rate:Normal ? ? ?  ?Neuro/Psych ?negative neurological ROS ? negative psych ROS  ? GI/Hepatic ?negative GI ROS, Neg liver ROS,   ?Endo/Other  ?negative endocrine ROS ? Renal/GU ?negative Renal ROS  ?negative genitourinary ?  ?Musculoskeletal ?negative musculoskeletal ROS ?(+)  ? Abdominal ?  ?Peds ?negative pediatric ROS ?(+)  Hematology ?negative hematology ROS ?(+)   ?Anesthesia Other Findings ? ? Reproductive/Obstetrics ?negative OB ROS ? ?  ? ? ? ? ? ? ? ? ? ? ? ? ? ?  ?  ? ? ? ? ? ? ?Anesthesia Physical ?Anesthesia Plan ? ?ASA: 2 and emergent ? ?Anesthesia Plan: General  ? ?Post-op Pain Management: Minimal or no pain anticipated  ? ?Induction: Intravenous ? ?PONV Risk Score and Plan: 3 and Dexamethasone ? ?Airway Management Planned: Oral ETT and Video Laryngoscope Planned ? ?Additional Equipment:  ? ?Intra-op Plan:  ? ?Post-operative Plan: Extubation in OR ? ?Informed Consent: I have reviewed the patients History and Physical, chart, labs and discussed the procedure including the risks, benefits and alternatives for the proposed anesthesia with the patient or authorized representative who has indicated his/her understanding and acceptance.  ? ? ? ?Dental advisory given ? ?Plan Discussed with: CRNA and Surgeon ? ?Anesthesia Plan Comments:   ? ? ? ? ? ?Anesthesia Quick Evaluation ? ?

## 2021-04-13 NOTE — Anesthesia Postprocedure Evaluation (Signed)
Anesthesia Post Note ? ?Patient: JAVERIA BRISKI ? ?Procedure(s) Performed: FLEXIBLE SIGMOIDOSCOPY ?BIOPSY ? ?Patient location during evaluation: Nursing Unit ?Anesthesia Type: General ?Level of consciousness: awake and alert and oriented ?Pain management: pain level controlled ?Vital Signs Assessment: post-procedure vital signs reviewed and stable ?Respiratory status: spontaneous breathing, nonlabored ventilation and respiratory function stable ?Cardiovascular status: blood pressure returned to baseline and stable ?Postop Assessment: no apparent nausea or vomiting ?Anesthetic complications: no ? ? ?No notable events documented. ? ? ?Last Vitals:  ?Vitals:  ? 04/13/21 1523 04/13/21 1530  ?BP: (!) 148/100 (!) 141/96  ?Pulse: 89 89  ?Resp: 18 10  ?Temp:    ?SpO2: 99% 99%  ?  ?Last Pain:  ?Vitals:  ? 04/13/21 1530  ?TempSrc:   ?PainSc: 3   ? ? ?  ?  ?  ?  ?  ?  ? ?Kristin Lamagna C Eaden Hettinger ? ? ? ? ?

## 2021-04-13 NOTE — Transfer of Care (Signed)
Immediate Anesthesia Transfer of Care Note ? ?Patient: SHERAY GRIST ? ?Procedure(s) Performed: FLEXIBLE SIGMOIDOSCOPY ?BIOPSY ? ?Patient Location: PACU ? ?Anesthesia Type:General ? ?Level of Consciousness: awake, alert  and sedated ? ?Airway & Oxygen Therapy: Patient Spontanous Breathing and Patient connected to nasal cannula oxygen ? ?Post-op Assessment: Report given to RN and Post -op Vital signs reviewed and stable ? ?Post vital signs: Reviewed and stable ? ?Last Vitals:  ?Vitals Value Taken Time  ?BP 136/94 04/13/21 1500  ?Temp 36.7 ?C 04/13/21 1455  ?Pulse 91 04/13/21 1506  ?Resp 21 04/13/21 1506  ?SpO2 98 % 04/13/21 1506  ?Vitals shown include unvalidated device data. ? ?Last Pain:  ?Vitals:  ? 04/13/21 1455  ?TempSrc:   ?PainSc: Asleep  ?   ? ?Patients Stated Pain Goal: 5 (04/13/21 1329) ? ?Complications: No notable events documented. ?

## 2021-04-13 NOTE — Consult Note (Signed)
[facility] Consult ? ?Reason for Consult: Colon mass ?Referring Physician: Dr. Manuella Ghazi ? ?Chief Complaint   ?Abdominal Pain ?  ? ? ?HPI: Alison Ramsey is a 51 y.o. female with HTN who presented to the ED with abdominal pain, nausea and vomiting and diarrhea. She says her pain progressed in the left over the last 3-4 days. She had some blood in the diarrhea. She says she has a family history of colon cancer in her aunt and her 1st cousin at less than 60 years of age. She had a CT scan that demonstrated a mass in the descending colon and colitis surrounding form the stool, large amount of stool burden and fecalized ileum. ? ?She is having a flexible sigmoidoscopy today with Dr. Abbey Chatters. ? ?Past Medical History:  ?Diagnosis Date  ? Hypertension   ? Lyme disease 2018  ? ? ?Past Surgical History:  ?Procedure Laterality Date  ? ABDOMINAL HYSTERECTOMY  2013  ? WISDOM TOOTH EXTRACTION    ? ? ?Family History  ?Problem Relation Age of Onset  ? Kidney disease Mother   ? Hypertension Mother   ? Stroke Father   ? ? ?Social History  ? ?Tobacco Use  ? Smoking status: Never  ? Smokeless tobacco: Never  ?Vaping Use  ? Vaping Use: Never used  ?Substance Use Topics  ? Alcohol use: Yes  ?  Comment: occasionally  ? Drug use: No  ? ? ?Medications: I have reviewed the patient's current medications. ?Prior to Admission:  ?Medications Prior to Admission  ?Medication Sig Dispense Refill Last Dose  ? acetaminophen (TYLENOL) 500 MG tablet Take 1,000 mg by mouth every 6 (six) hours as needed for mild pain or headache.   04/11/2021  ? ASHWAGANDHA PO Take 1 tablet by mouth daily.   04/12/2021  ? Digestive Enzymes (MULTI-ENZYME PO) Take 1 tablet by mouth daily.   04/12/2021  ? EPINEPHrine 0.3 mg/0.3 mL IJ SOAJ injection Inject 0.3 mg into the muscle as needed for anaphylaxis. 1 each 2 unk  ? FIBER PO Take 1 tablet by mouth daily.   04/12/2021  ? ibuprofen (ADVIL) 200 MG tablet Take 400 mg by mouth every 6 (six) hours as needed for  headache or mild pain.   04/11/2021  ? Lactobacillus (ACIDOPHILUS PO) Take 1 capsule by mouth daily.   04/12/2021  ? MAGNESIUM PO Take 1 tablet by mouth daily.   04/12/2021  ? Multiple Vitamins-Minerals (ZINC PO) Take 1 tablet by mouth daily.   04/12/2021  ? VITAMIN D PO Take 1 tablet by mouth daily.   04/12/2021  ? albuterol (PROVENTIL HFA;VENTOLIN HFA) 108 (90 Base) MCG/ACT inhaler Inhale 2 puffs into the lungs every 6 (six) hours as needed for wheezing or shortness of breath. (Patient not taking: Reported on 04/12/2021) 1 Inhaler 0 Not Taking  ? hydrochlorothiazide (HYDRODIURIL) 25 MG tablet Take 1 tablet (25 mg total) by mouth daily. (Needs to be seen before next refill) (Patient not taking: Reported on 04/12/2021) 30 tablet 0 Not Taking  ? ?Scheduled: ?Continuous: ? lactated ringers 75 mL/hr at 04/13/21 1230  ? ?PRN:[MAR Hold] acetaminophen **OR** [MAR Hold] acetaminophen, [MAR Hold] hydrALAZINE, [MAR Hold]  HYDROmorphone (DILAUDID) injection, [MAR Hold] ondansetron **OR** [MAR Hold] ondansetron (ZOFRAN) IV ? ?Allergies  ?Allergen Reactions  ? Aleve [Naproxen] Hives  ?  Ok with ibuprofen  ? Codeine Nausea And Vomiting and Rash  ?  hives  ? Latex Rash  ? ? ? ?ROS:  ?A comprehensive review of systems was  negative except for: Gastrointestinal: positive for abdominal pain, diarrhea, nausea, and vomiting ? ?Blood pressure (!) 176/110, pulse (!) 103, temperature 97.6 ?F (36.4 ?C), temperature source Oral, resp. rate 20, height '5\' 5"'$  (1.651 m), weight 99.5 kg, SpO2 98 %. ?Physical Exam ?Vitals reviewed.  ?Constitutional:   ?   Appearance: She is obese.  ?HENT:  ?   Head: Normocephalic.  ?Eyes:  ?   Extraocular Movements: Extraocular movements intact.  ?Cardiovascular:  ?   Rate and Rhythm: Normal rate.  ?Pulmonary:  ?   Effort: Pulmonary effort is normal.  ?Abdominal:  ?   Palpations: Abdomen is soft.  ?   Tenderness: There is abdominal tenderness in the left lower quadrant.  ?Musculoskeletal:  ?   Comments: Moves all extremities   ?Skin: ?   General: Skin is warm.  ?Neurological:  ?   General: No focal deficit present.  ?   Mental Status: She is alert and oriented to person, place, and time.  ?Psychiatric:     ?   Mood and Affect: Mood normal.     ?   Behavior: Behavior normal.  ? ? ?Results: ?Results for orders placed or performed during the hospital encounter of 04/12/21 (from the past 48 hour(s))  ?CBC with Differential     Status: Abnormal  ? Collection Time: 04/12/21  4:02 PM  ?Result Value Ref Range  ? WBC 12.9 (H) 4.0 - 10.5 K/uL  ? RBC 5.24 (H) 3.87 - 5.11 MIL/uL  ? Hemoglobin 14.9 12.0 - 15.0 g/dL  ? HCT 42.7 36.0 - 46.0 %  ? MCV 81.5 80.0 - 100.0 fL  ? MCH 28.4 26.0 - 34.0 pg  ? MCHC 34.9 30.0 - 36.0 g/dL  ? RDW 14.0 11.5 - 15.5 %  ? Platelets 355 150 - 400 K/uL  ? nRBC 0.0 0.0 - 0.2 %  ? Neutrophils Relative % 86 %  ? Neutro Abs 11.0 (H) 1.7 - 7.7 K/uL  ? Lymphocytes Relative 9 %  ? Lymphs Abs 1.2 0.7 - 4.0 K/uL  ? Monocytes Relative 4 %  ? Monocytes Absolute 0.5 0.1 - 1.0 K/uL  ? Eosinophils Relative 1 %  ? Eosinophils Absolute 0.1 0.0 - 0.5 K/uL  ? Basophils Relative 0 %  ? Basophils Absolute 0.0 0.0 - 0.1 K/uL  ? Immature Granulocytes 0 %  ? Abs Immature Granulocytes 0.04 0.00 - 0.07 K/uL  ?  Comment: Performed at [facility], [address], [place], [place] [address]  ?Comprehensive metabolic panel     Status: Abnormal  ? Collection Time: 04/12/21  4:02 PM  ?Result Value Ref Range  ? Sodium 136 135 - 145 mmol/L  ? Potassium 3.3 (L) 3.5 - 5.1 mmol/L  ? Chloride 105 98 - 111 mmol/L  ? CO2 23 22 - 32 mmol/L  ? Glucose, Bld 118 (H) 70 - 99 mg/dL  ?  Comment: Glucose reference range applies only to samples taken after fasting for at least 8 hours.  ? BUN 14 6 - 20 mg/dL  ? Creatinine, Ser 0.84 0.44 - 1.00 mg/dL  ? Calcium 9.3 8.9 - 10.3 mg/dL  ? Total Protein 8.1 6.5 - 8.1 g/dL  ? Albumin 4.6 3.5 - 5.0 g/dL  ? AST 16 15 - 41 U/L  ? ALT 17 0 - 44 U/L  ? Alkaline Phosphatase 72 38 - 126 U/L  ? Total Bilirubin 0.6 0.3 - 1.2 mg/dL  ?  GFR, Estimated >60 >60 mL/min  ?  Comment: (  NOTE) ?Calculated using the CKD-EPI Creatinine Equation (2021) ?  ? Anion gap 8 5 - 15  ?  Comment: Performed at [facility], [address] [place], [place] [address]  ?Lipase, blood     Status: None  ? Collection Time: 04/12/21  4:02 PM  ?Result Value Ref Range  ? Lipase 26 11 - 51 U/L  ?  Comment: Performed at [facility], [address] [place], [place] [address]  ?Urinalysis, Routine w reflex microscopic Urine, Clean Catch     Status: Abnormal  ? Collection Time: 04/12/21  4:02 PM  ?Result Value Ref Range  ? Color, Urine YELLOW YELLOW  ? APPearance HAZY (A) CLEAR  ? Specific Gravity, Urine 1.028 1.005 - 1.030  ? pH 5.0 5.0 - 8.0  ? Glucose, UA NEGATIVE NEGATIVE mg/dL  ? Hgb urine dipstick NEGATIVE NEGATIVE  ? Bilirubin Urine NEGATIVE NEGATIVE  ? Ketones, ur 5 (A) NEGATIVE mg/dL  ? Protein, ur 30 (A) NEGATIVE mg/dL  ? Nitrite NEGATIVE NEGATIVE  ? Leukocytes,Ua NEGATIVE NEGATIVE  ? RBC / HPF 0-5 0 - 5 RBC/hpf  ? WBC, UA 0-5 0 - 5 WBC/hpf  ? Bacteria, UA NONE SEEN NONE SEEN  ? Squamous Epithelial / LPF 0-5 0 - 5  ? Mucus PRESENT   ? Ca Oxalate Crys, UA PRESENT   ?  Comment: Performed at [facility], [address] [place], [place] [address]  ?Troponin I (High Sensitivity)     Status: None  ? Collection Time: 04/12/21  4:02 PM  ?Result Value Ref Range  ? Troponin I (High Sensitivity) <2 <18 ng/L  ?  Comment: (NOTE) ?Elevated high sensitivity troponin I (hsTnI) values and significant  ?changes across serial measurements may suggest ACS but many other  ?chronic and acute conditions are known to elevate hsTnI results.  ?Refer to the "Links" section for chest pain algorithms and additional  ?guidance. ?Performed at [facility], [address] [place], [place] [address] ?  ?D-dimer, quantitative     Status: Abnormal  ? Collection Time: 04/12/21  4:02 PM  ?Result Value Ref Range  ? D-Dimer, Quant 3.28 (H) 0.00 - 0.50 ug/mL-FEU  ?  Comment: (NOTE) ?At the manufacturer  cut-off value of 0.5 ?g/mL FEU, this assay has a ?negative predictive value of 95-100%.This assay is intended for use ?in conjunction with a clinical pretest probability (PTP) assessment ?model to exclude pulmonar

## 2021-04-13 NOTE — Consult Note (Signed)
?Consulting  Provider: Dr. Manuella Ghazi ?Primary Care Physician:  Sharion Balloon, FNP ?Primary Gastroenterologist: Previously unassigned, Dr. Abbey Chatters ? ?Reason for Consultation: Descending colon mass with large bowel obstruction ? ?HPI:  ?Alison Ramsey is a 51 y.o. female with a past medical history of hypertension who presented to [facility] ER yesterday evening due to abdominal pain, nausea vomiting, diarrhea.  Patient states that she was in her usual state of health until approximately 3 to 4 days ago when she had progressively worsening left-sided abdominal pain.  This was followed with diarrhea.  Does note 1 bowel movement yesterday which she describes as a blood clot.  Left-sided abdominal pain, moderate severity, does not radiate.  Family history of colon cancer in her first cousin who died in his 37s and her aunt who died from colon cancer in her 29s.  No previous colonoscopy.  No unintentional weight loss. ? ?In the ER, patient had CT scan which I personally reviewed showed findings concerning for descending colon mass approximately 5 cm with stercoral colitis proximal and significant obstruction.  Also mentioneded possible cholelithiasis versus gallbladder polyp and a few small pulmonary nodules. ? ?Past Medical History:  ?Diagnosis Date  ? Hypertension   ? Lyme disease 2018  ? ? ?Past Surgical History:  ?Procedure Laterality Date  ? ABDOMINAL HYSTERECTOMY  2013  ? WISDOM TOOTH EXTRACTION    ? ? ?Prior to Admission medications   ?Medication Sig Start Date End Date Taking? Authorizing Provider  ?acetaminophen (TYLENOL) 500 MG tablet Take 1,000 mg by mouth every 6 (six) hours as needed for mild pain or headache.   Yes [provider]  ?ASHWAGANDHA PO Take 1 tablet by mouth daily.   Yes [provider]  ?Digestive Enzymes (MULTI-ENZYME PO) Take 1 tablet by mouth daily.   Yes [provider]  ?EPINEPHrine 0.3 mg/0.3 mL IJ SOAJ injection Inject 0.3 mg into the muscle as needed for  anaphylaxis. 07/12/20  Yes Gwenlyn Perking, FNP  ?FIBER PO Take 1 tablet by mouth daily.   Yes [provider]  ?ibuprofen (ADVIL) 200 MG tablet Take 400 mg by mouth every 6 (six) hours as needed for headache or mild pain.   Yes [provider]  ?Lactobacillus (ACIDOPHILUS PO) Take 1 capsule by mouth daily.   Yes [provider]  ?MAGNESIUM PO Take 1 tablet by mouth daily.   Yes [provider]  ?Multiple Vitamins-Minerals (ZINC PO) Take 1 tablet by mouth daily.   Yes [provider]  ?VITAMIN D PO Take 1 tablet by mouth daily.   Yes [provider]  ?albuterol (PROVENTIL HFA;VENTOLIN HFA) 108 (90 Base) MCG/ACT inhaler Inhale 2 puffs into the lungs every 6 (six) hours as needed for wheezing or shortness of breath. ?Patient not taking: Reported on 04/12/2021 09/16/17   Terald Sleeper, PA-C  ?hydrochlorothiazide (HYDRODIURIL) 25 MG tablet Take 1 tablet (25 mg total) by mouth daily. (Needs to be seen before next refill) ?Patient not taking: Reported on 04/12/2021 04/09/18   Sharion Balloon, FNP  ? ? ?Current Facility-Administered Medications  ?Medication Dose Route Frequency Provider Last Rate Last Admin  ? acetaminophen (TYLENOL) tablet 650 mg  650 mg Oral Q6H PRN Truett Mainland, DO   650 mg at 04/13/21 1950  ? Or  ? acetaminophen (TYLENOL) suppository 650 mg  650 mg Rectal Q6H PRN Truett Mainland, DO      ? enoxaparin (LOVENOX) injection 40 mg  40 mg Subcutaneous Q24H Manuella Ghazi,  Pratik D, DO      ? hydrALAZINE (APRESOLINE) injection 10 mg  10 mg Intravenous Q4H PRN Truett Mainland, DO   10 mg at 04/13/21 0304  ? HYDROmorphone (DILAUDID) injection 0.5-1 mg  0.5-1 mg Intravenous Q2H PRN Truett Mainland, DO   0.5 mg at 04/12/21 2113  ? lactated ringers infusion   Intravenous Continuous Heath Lark D, DO 100 mL/hr at 04/13/21 0646 New Bag at 04/13/21 0646  ? ondansetron (ZOFRAN) tablet 4 mg  4 mg Oral Q6H PRN Truett Mainland, DO      ? Or  ? ondansetron (ZOFRAN) injection  4 mg  4 mg Intravenous Q6H PRN Truett Mainland, DO   4 mg at 04/13/21 0255  ? ? ?Allergies as of 04/12/2021 - Review Complete 04/12/2021  ?Allergen Reaction Noted  ? Aleve [naproxen] Hives 04/12/2021  ? Codeine Nausea And Vomiting and Rash 04/14/2016  ? Latex Rash 05/23/2015  ? ? ?Family History  ?Problem Relation Age of Onset  ? Kidney disease Mother   ? Hypertension Mother   ? Stroke Father   ? ? ?Social History  ? ?Socioeconomic History  ? Marital status: Married  ?  Spouse name: Not on file  ? Number of children: Not on file  ? Years of education: Not on file  ? Highest education level: Not on file  ?Occupational History  ? Not on file  ?Tobacco Use  ? Smoking status: Never  ? Smokeless tobacco: Never  ?Vaping Use  ? Vaping Use: Never used  ?Substance and Sexual Activity  ? Alcohol use: Yes  ?  Comment: occasionally  ? Drug use: No  ? Sexual activity: Yes  ?Other Topics Concern  ? Not on file  ?Social History Narrative  ? Not on file  ? ?Social Determinants of Health  ? ?Financial Resource Strain: Not on file  ?Food Insecurity: Not on file  ?Transportation Needs: Not on file  ?Physical Activity: Not on file  ?Stress: Not on file  ?Social Connections: Not on file  ?Intimate Partner Violence: Not on file  ? ? ?Review of Systems: ?General: Negative for anorexia, weight loss, fever, chills, fatigue, weakness. ?Eyes: Negative for vision changes.  ?ENT: Negative for hoarseness, difficulty swallowing , nasal congestion. ?CV: Negative for chest pain, angina, palpitations, dyspnea on exertion, peripheral edema.  ?Respiratory: Negative for dyspnea at rest, dyspnea on exertion, cough, sputum, wheezing.  ?GI: See history of present illness. ?GU:  Negative for dysuria, hematuria, urinary incontinence, urinary frequency, nocturnal urination.  ?MS: Negative for joint pain, low back pain.  ?Derm: Negative for rash or itching.  ?Neuro: Negative for weakness, abnormal sensation, seizure, frequent headaches, memory loss,  confusion.  ?Psych: Negative for anxiety, depression ?Endo: Negative for unusual weight change.  ?Heme: Negative for bruising or bleeding. ?Allergy: Negative for rash or hives. ? ?Physical Exam: ?Vital signs in last 24 hours: ?Temp:  [97.8 ?F (36.6 ?C)-98.9 ?F (37.2 ?C)] 97.9 ?F (36.6 ?C) (04/08 0300) ?Pulse Rate:  [78-93] 93 (04/08 0531) ?Resp:  [14-22] 16 (04/08 0300) ?BP: (137-206)/(85-117) 137/85 (04/08 0531) ?SpO2:  [95 %-100 %] 99 % (04/08 0531) ?FiO2 (%):  [21 %] 21 % (04/07 2045) ?Weight:  [99.5 kg-99.8 kg] 99.5 kg (04/07 2042) ?Last BM Date : 04/10/21 ?General:   Alert,  Well-developed, well-nourished, pleasant and cooperative in NAD ?Head:  Normocephalic and atraumatic. ?Eyes:  Sclera clear, no icterus.   Conjunctiva pink. ?Ears:  Normal auditory acuity. ?Nose:  No deformity, discharge,  or lesions. ?Mouth:  No deformity or lesions, dentition normal. ?Neck:  Supple; no masses or thyromegaly. ?Lungs:  Clear throughout to auscultation.   No wheezes, crackles, or rhonchi. No acute distress. ?Heart:  Regular rate and rhythm; no murmurs, clicks, rubs,  or gallops. ?Abdomen:  Soft, nontender and nondistended. No masses, hepatosplenomegaly or hernias noted. Normal bowel sounds, without guarding, and without rebound.   ?Msk:  Symmetrical without gross deformities. Normal posture. ?Pulses:  Normal pulses noted. ?Extremities:  Without clubbing or edema. ?Neurologic:  Alert and  oriented x4;  grossly normal neurologically. ?Skin:  Intact without significant lesions or rashes. ?Cervical Nodes:  No significant cervical adenopathy. ?Psych:  Alert and cooperative. Normal mood and affect. ? ?Intake/Output from previous day: ?04/07 0701 - 04/08 0700 ?In: 848.7 [I.V.:848.7] ?Out: -  ?Intake/Output this shift: ?No intake/output data recorded. ? ?Lab Results: ?Recent Labs  ?  04/12/21 ?1602 04/13/21 ?0454  ?WBC 12.9* 13.7*  ?HGB 14.9 13.6  ?HCT 42.7 40.5  ?PLT 355 306  ? ?BMET ?Recent Labs  ?  04/12/21 ?1602 04/13/21 ?0454   ?NA 136 138  ?K 3.3* 3.6  ?CL 105 105  ?CO2 23 24  ?GLUCOSE 118* 165*  ?BUN 14 12  ?CREATININE 0.84 0.74  ?CALCIUM 9.3 9.0  ? ?LFT ?Recent Labs  ?  04/12/21 ?1602  ?PROT 8.1  ?ALBUMIN 4.6  ?AST 16  ?ALT 17  ?[facility]

## 2021-04-13 NOTE — Hospital Course (Addendum)
Per HPI: ?[name] is a 51 y.o. female with medical history significant of hypertension and family history of colon cancer.  She presents with 2 weeks of intermittent cramping periumbilical abdominal pain with nausea and vomiting that started earlier today.  Patient does have a history of IBS and thought that this was her normal IBS symptoms.  Her husband convinced her to come to the hospital for evaluation.  She did notice a small amount of blood in her stool within this past week.  She has been having some diarrhea.  There has not been any palliating or provoking factors.  She has not been tolerating oral food or liquids at this moment.  She denies fevers, chills, chest pain, shortness of breath, headaches. ? 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  She continues to have some ongoing abdominal pain.  She is status post flex sigmoidoscopy on 4/8 with biopsies taken.  Anticipating colon resection with colostomy on 4/10.  Potassium being repleted. ?

## 2021-04-14 DIAGNOSIS — K5652 Intestinal adhesions [bands] with complete obstruction: Secondary | ICD-10-CM

## 2021-04-14 DIAGNOSIS — K5669 Other partial intestinal obstruction: Secondary | ICD-10-CM

## 2021-04-14 DIAGNOSIS — K56600 Partial intestinal obstruction, unspecified as to cause: Secondary | ICD-10-CM

## 2021-04-14 LAB — CBC
HCT: 36.6 % (ref 36.0–46.0)
Hemoglobin: 12.6 g/dL (ref 12.0–15.0)
MCH: 28.3 pg (ref 26.0–34.0)
MCHC: 34.4 g/dL (ref 30.0–36.0)
MCV: 82.2 fL (ref 80.0–100.0)
Platelets: 344 10*3/uL (ref 150–400)
RBC: 4.45 MIL/uL (ref 3.87–5.11)
RDW: 14.1 % (ref 11.5–15.5)
WBC: 11.3 10*3/uL — ABNORMAL HIGH (ref 4.0–10.5)
nRBC: 0 % (ref 0.0–0.2)

## 2021-04-14 LAB — ABO/RH: ABO/RH(D): O NEG

## 2021-04-14 LAB — COMPREHENSIVE METABOLIC PANEL
ALT: 12 U/L (ref 0–44)
AST: 12 U/L — ABNORMAL LOW (ref 15–41)
Albumin: 3.7 g/dL (ref 3.5–5.0)
Alkaline Phosphatase: 56 U/L (ref 38–126)
Anion gap: 10 (ref 5–15)
BUN: 15 mg/dL (ref 6–20)
CO2: 24 mmol/L (ref 22–32)
Calcium: 8.8 mg/dL — ABNORMAL LOW (ref 8.9–10.3)
Chloride: 106 mmol/L (ref 98–111)
Creatinine, Ser: 0.75 mg/dL (ref 0.44–1.00)
GFR, Estimated: >60 mL/min (ref >60)
Glucose, Bld: 127 mg/dL — ABNORMAL HIGH (ref 70–99)
Potassium: 2.9 mmol/L — ABNORMAL LOW (ref 3.5–5.1)
Sodium: 140 mmol/L (ref 135–145)
Total Bilirubin: 0.8 mg/dL (ref 0.3–1.2)
Total Protein: 6.4 g/dL — ABNORMAL LOW (ref 6.5–8.1)

## 2021-04-14 LAB — TYPE AND SCREEN
ABO/RH(D): O NEG
Antibody Screen: NEGATIVE

## 2021-04-14 LAB — PREPARE RBC (CROSSMATCH)

## 2021-04-14 LAB — MAGNESIUM: Magnesium: 2.1 mg/dL (ref 1.7–2.4)

## 2021-04-14 MED ORDER — PROMETHAZINE HCL 25 MG/ML IJ SOLN
INTRAMUSCULAR | Status: AC
  Filled 2021-04-14: qty 1

## 2021-04-14 MED ORDER — ACETAMINOPHEN 500 MG PO TABS: 1000.0000 mg | ORAL_TABLET | ORAL | Status: DC

## 2021-04-14 MED ORDER — CHLORHEXIDINE GLUCONATE CLOTH 2 % EX PADS
6.0000 | MEDICATED_PAD | Freq: Once | CUTANEOUS | Status: AC
  Administered 2021-04-14: 6 via TOPICAL

## 2021-04-14 MED ORDER — POTASSIUM CHLORIDE 2 MEQ/ML IV SOLN
INTRAVENOUS | Status: DC
  Filled 2021-04-14 (×5): qty 1000

## 2021-04-14 MED ORDER — GABAPENTIN 300 MG PO CAPS: 300.0000 mg | ORAL_CAPSULE | ORAL | Status: DC

## 2021-04-14 MED ORDER — POTASSIUM CHLORIDE CRYS ER 20 MEQ PO TBCR
40.0000 meq | EXTENDED_RELEASE_TABLET | Freq: Once | ORAL | Status: AC
  Administered 2021-04-14: 40 meq via ORAL
  Filled 2021-04-14: qty 2

## 2021-04-14 MED ORDER — SODIUM CHLORIDE 0.9 % IV SOLN
2.0000 g | INTRAVENOUS | Status: AC
  Administered 2021-04-15: 2 g via INTRAVENOUS
  Filled 2021-04-14: qty 2

## 2021-04-14 MED ORDER — SODIUM CHLORIDE 0.9 % IV SOLN
25.0000 mg | Freq: Four times a day (QID) | INTRAVENOUS | Status: DC | PRN
  Administered 2021-04-14 (×2): 25 mg via INTRAVENOUS
  Filled 2021-04-14: qty 1

## 2021-04-14 NOTE — H&P (View-Only) (Signed)
[facility] Progress Note ? ?1 Day Post-Op  ?Subjective: ?Doing ok. Flex sig with mass and tattoo distal.  ? ?Objective: ?Vital signs in last 24 hours: ?Temp:  [97.4 ?F (36.3 ?C)-98.2 ?F (36.8 ?C)] 97.4 ?F (36.3 ?C) (04/09 1230) ?Pulse Rate:  [89-103] 94 (04/09 1230) ?Resp:  [10-20] 16 (04/09 1230) ?BP: (136-176)/(78-110) 151/92 (04/09 1230) ?SpO2:  [94 %-100 %] 100 % (04/09 1230) ?Last BM Date : 04/12/21 ? ?Intake/Output from previous day: ?04/08 0701 - 04/09 0700 ?In: 1487.5 [I.V.:1487.5] ?Out: -  ?Intake/Output this shift: ?No intake/output data recorded. ? ?General appearance: alert and no distress ?GI: soft, distended, minimally tender  ? ?Lab Results:  ?Recent Labs  ?  04/13/21 ?6644 04/14/21 ?0347  ?WBC 13.7* 11.3*  ?HGB 13.6 12.6  ?HCT 40.5 36.6  ?PLT 306 344  ? ?BMET ?Recent Labs  ?  04/13/21 ?0454 04/14/21 ?4259  ?NA 138 140  ?K 3.6 2.9*  ?CL 105 106  ?CO2 24 24  ?GLUCOSE 165* 127*  ?BUN 12 15  ?CREATININE 0.74 0.75  ?CALCIUM 9.0 8.8*  ? ?PT/INR ?No results for input(s): LABPROT, INR in the last 72 hours. ? ?Studies/Results: ?CT Angio Chest PE W and/or Wo Contrast ? ?Result Date: 04/12/2021 ?CLINICAL DATA:  Sob, chest pain, general abd pain/cramping x 2 weeks, worse in last couple days. Elevated d-dimer. EXAM: CT ANGIOGRAPHY CHEST CT ABDOMEN AND PELVIS WITH CONTRAST TECHNIQUE: Multidetector CT imaging of the chest was performed using the standard protocol during bolus administration of intravenous contrast. Multiplanar CT image reconstructions and MIPs were obtained to evaluate the vascular anatomy. Multidetector CT imaging of the abdomen and pelvis was performed using the standard protocol during bolus administration of intravenous contrast. RADIATION DOSE REDUCTION: This exam was performed according to the departmental dose-optimization program which includes automated exposure control, adjustment of the mA and/or kV according to patient size and/or use of iterative reconstruction  technique. CONTRAST:  194m OMNIPAQUE IOHEXOL 350 MG/ML SOLN COMPARISON:  None. FINDINGS: CTA CHEST FINDINGS Cardiovascular: Satisfactory opacification of the pulmonary arteries to the segmental level. No evidence of pulmonary embolism. Normal heart size. No significant pericardial effusion. The thoracic aorta is normal in caliber. No atherosclerotic plaque of the thoracic aorta. No coronary artery calcifications. Mediastinum/Nodes: No enlarged mediastinal, hilar, or axillary lymph nodes. Thyroid gland, trachea, and esophagus demonstrate no significant findings. Pre-vascular 1.2 cm fluid density likely benign etiology. Lungs/Pleura: Expiratory phase of respiration. No focal consolidation. There is a 4 mm right upper lobe calcified nodule (6:43). There is a 5 mm right upper lobe pulmonary nodule (6:51). No pulmonary mass. No pleural effusion. No pneumothorax. Musculoskeletal: No chest wall abnormality. No suspicious lytic or blastic osseous lesions. No acute displaced fracture. Review of the MIP images confirms the above findings. CT ABDOMEN and PELVIS FINDINGS Hepatobiliary: No focal liver abnormality. Couple of approximately 5 mm hyperdense foci along the gallbladder lumen (2:27, 30). No gallbladder wall thickening or pericholecystic fluid. No biliary dilatation. Pancreas: No focal lesion. Normal pancreatic contour. No surrounding inflammatory changes. No main pancreatic ductal dilatation. Spleen: Normal in size without focal abnormality. Adrenals/Urinary Tract: No adrenal nodule bilaterally. Bilateral kidneys enhance symmetrically. No definite nephrolithiasis.  No nephroureterolithiasis. No hydronephrosis. No hydroureter. The urinary bladder is unremarkable. Stomach/Bowel: Stomach is within normal limits. No evidence of small bowel wall thickening or dilatation. Fecalization material within the lumen of the terminal ileum consistent with a slow transition state. There is a 5 cm in length irregular bowel wall  thickening of distal descending  colon with associated proximal increased stool burden throughout the colon as well as bowel wall thickening and pericolonic fat stranding. Appendix appears normal. Vascular/Lymphatic: No abdominal aorta or iliac aneurysm. Prominent but nonenlarged left lower quadrant cyst pericolonic mesenteric lymph nodes (2:50 5-59). No abdominal, pelvic, or inguinal lymphadenopathy. Reproductive: Status post hysterectomy. No adnexal masses. Other: Trace free fluid within the pelvis. No intraperitoneal free gas. No organized fluid collection. Musculoskeletal: No abdominal wall hernia or abnormality. No suspicious lytic or blastic osseous lesions. No acute displaced fracture. Review of the MIP images confirms the above findings. IMPRESSION: 1. 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 No bowel perforation. 2. Couple of indeterminate right upper lobe pulmonary nodules one calcified and one noncalcified measuring up to 5 mm. Recommend attention on follow-up. 3. Couple of approximately 5 mm hyperdense foci along the gallbladder lumen may represent gallbladder polyp or stone. Recommend right upper quadrant ultrasound further evaluation. 4. No pulmonary embolus. Electronically Signed   By: Iven Finn M.D.   On: 04/12/2021 17:54  ? ?CT ABDOMEN PELVIS W CONTRAST ? ?Result Date: 04/12/2021 ?CLINICAL DATA:  Sob, chest pain, general abd pain/cramping x 2 weeks, worse in last couple days. Elevated d-dimer. EXAM: CT ANGIOGRAPHY CHEST CT ABDOMEN AND PELVIS WITH CONTRAST TECHNIQUE: Multidetector CT imaging of the chest was performed using the standard protocol during bolus administration of intravenous contrast. Multiplanar CT image reconstructions and MIPs were obtained to evaluate the vascular anatomy. Multidetector CT imaging of the abdomen and pelvis was performed using the standard protocol during bolus administration of intravenous contrast. RADIATION  DOSE REDUCTION: This exam was performed according to the departmental dose-optimization program which includes automated exposure control, adjustment of the mA and/or kV according to patient size and/or use of iterative reconstruction technique. CONTRAST:  127m OMNIPAQUE IOHEXOL 350 MG/ML SOLN COMPARISON:  None. FINDINGS: CTA CHEST FINDINGS Cardiovascular: Satisfactory opacification of the pulmonary arteries to the segmental level. No evidence of pulmonary embolism. Normal heart size. No significant pericardial effusion. The thoracic aorta is normal in caliber. No atherosclerotic plaque of the thoracic aorta. No coronary artery calcifications. Mediastinum/Nodes: No enlarged mediastinal, hilar, or axillary lymph nodes. Thyroid gland, trachea, and esophagus demonstrate no significant findings. Pre-vascular 1.2 cm fluid density likely benign etiology. Lungs/Pleura: Expiratory phase of respiration. No focal consolidation. There is a 4 mm right upper lobe calcified nodule (6:43). There is a 5 mm right upper lobe pulmonary nodule (6:51). No pulmonary mass. No pleural effusion. No pneumothorax. Musculoskeletal: No chest wall abnormality. No suspicious lytic or blastic osseous lesions. No acute displaced fracture. Review of the MIP images confirms the above findings. CT ABDOMEN and PELVIS FINDINGS Hepatobiliary: No focal liver abnormality. Couple of approximately 5 mm hyperdense foci along the gallbladder lumen (2:27, 30). No gallbladder wall thickening or pericholecystic fluid. No biliary dilatation. Pancreas: No focal lesion. Normal pancreatic contour. No surrounding inflammatory changes. No main pancreatic ductal dilatation. Spleen: Normal in size without focal abnormality. Adrenals/Urinary Tract: No adrenal nodule bilaterally. Bilateral kidneys enhance symmetrically. No definite nephrolithiasis.  No nephroureterolithiasis. No hydronephrosis. No hydroureter. The urinary bladder is unremarkable. Stomach/Bowel: Stomach is  within normal limits. No evidence of small bowel wall thickening or dilatation. Fecalization material within the lumen of the terminal ileum consistent with a slow transition state. There is a 5 cm in length irre

## 2021-04-14 NOTE — Progress Notes (Signed)
?PROGRESS NOTE ? ? ? Alison Ramsey  QJF:[id number] DOB: 15-Aug-1970 DOA: 04/12/2021 ?PCP: Sharion Balloon, FNP ? ? ?Brief Narrative:  ?Per HPI: ?Alison Ramsey is a 51 y.o. female with medical history significant of hypertension and family history of colon cancer.  She presents with 2 weeks of intermittent cramping periumbilical abdominal pain with nausea and vomiting that started earlier today.  Patient does have a history of IBS and thought that this was her normal IBS symptoms.  Her husband convinced her to come to the hospital for evaluation.  She did notice a small amount of blood in her stool within this past week.  She has been having some diarrhea.  There has not been any palliating or provoking factors.  She has not been tolerating oral food or liquids at this moment.  She denies fevers, chills, chest pain, shortness of breath, headaches. ? 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  She continues to have some ongoing abdominal pain.  She is status post flex sigmoidoscopy on 4/8 with biopsies taken.  Anticipating colon resection with colostomy on 4/10.  Potassium being repleted.  ? ? ?Assessment & Plan: ?  ?Principal Problem: ?  Bowel obstruction ([place]) ?Active Problems: ?  Hypertension ?  Colonic neoplasm ? ?Assessment and Plan: ? ? 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 to continue with intermittent n/v zofran/phenergan ordered ?Continue IVF with Kcl ?GI and general surgery ongoing recommendations appreciated ?CA125, CEA pending along with biopsies ?  ?Hypertension ?Hydralazine 10 mg IV every 4 hours as needed elevated blood pressures ? ?3.   Hypokalemia ? -Replete IV and PO and monitor in am ?  ?  ?DVT prophylaxis: Lovenox ?Code Status: Full ?Family Communication: Son and daughter at bedside 4/9 ?Disposition Plan:  ?Status is: Inpatient ?Remains inpatient appropriate because:  IV fluids. ?  ?Consultants:  ?GI ?General surgery ?  ?Procedures:  ?Flex sig with biopsies 4/8 ?  ?Antimicrobials:  ?None ?  ? ?Subjective: ?Patient seen and evaluated today with ongoing mild to moderate abdominal pain with some nausea noted as well.  She would like to try Phenergan for her nausea. ? ?Objective: ?Vitals:  ? 04/13/21 1523 04/13/21 1530 04/13/21 2029 04/14/21 0402  ?BP: (!) 148/100 (!) 141/96 (!) 158/96 (!) 147/78  ?Pulse: 89 89 97 93  ?Resp: '18 10 18 18  '$ ?Temp:   98.2 ?F (36.8 ?C) 98.1 ?F (36.7 ?C)  ?TempSrc:   Oral Oral  ?SpO2: 99% 99% 94% 95%  ?Weight:      ?Height:      ? ? ?Intake/Output Summary (Last 24 hours) at 04/14/2021 0918 ?Last data filed at 04/13/2021 Bosie Helper ?Gross per 24 hour  ?Intake 1487.47 ml  ?Output --  ?Net 1487.47 ml  ? ?Filed Weights  ? 04/12/21 1541 04/12/21 2042  ?Weight: 99.8 kg 99.5 kg  ? ? ?Examination: ? ?General exam: Appears calm and comfortable  ?Respiratory system: Clear to auscultation. Respiratory effort normal. ?Cardiovascular system: S1 & S2 heard, RRR.  ?Gastrointestinal system: Abdomen is soft ?Central nervous system: Alert and awake ?Extremities: No edema ?Skin: No significant lesions noted ?Psychiatry: Flat affect. ? ? ? ?Data Reviewed: I have personally reviewed following labs and imaging studies ? ?CBC: ?Recent Labs  ?Lab 04/12/21 ?1602 04/13/21 ?0454 04/14/21 ?8937  ?WBC 12.9* 13.7* 11.3*  ?NEUTROABS 11.0*  --   --   ?HGB 14.9 13.6 12.6  ?HCT 42.7 40.5  36.6  ?MCV 81.5 82.7 82.2  ?PLT 355 306 344  ? ?Basic Metabolic Panel: ?Recent Labs  ?Lab 04/12/21 ?1602 04/13/21 ?0454 04/14/21 ?9509  ?NA 136 138 140  ?K 3.3* 3.6 2.9*  ?CL 105 105 106  ?CO2 '23 24 24  '$ ?GLUCOSE 118* 165* 127*  ?BUN '14 12 15  '$ ?CREATININE 0.84 0.74 0.75  ?CALCIUM 9.3 9.0 8.8*  ?MG  --   --  2.1  ? ?GFR: ?Estimated Creatinine Clearance: 98.3 mL/min (by C-G formula based on SCr of 0.75 mg/dL). ?Liver Function Tests: ?Recent Labs  ?Lab 04/12/21 ?1602 04/14/21 ?3267  ?AST 16 12*  ?ALT 17 12  ?ALKPHOS 72  56  ?BILITOT 0.6 0.8  ?PROT 8.1 6.4*  ?ALBUMIN 4.6 3.7  ? ?Recent Labs  ?Lab 04/12/21 ?1602  ?LIPASE 26  ? ?No results for input(s): AMMONIA in the last 168 hours. ?Coagulation Profile: ?No results for input(s): INR, PROTIME in the last 168 hours. ?Cardiac Enzymes: ?No results for input(s): CKTOTAL, CKMB, CKMBINDEX, TROPONINI in the last 168 hours. ?BNP (last 3 results) ?No results for input(s): PROBNP in the last 8760 hours. ?HbA1C: ?No results for input(s): HGBA1C in the last 72 hours. ?CBG: ?No results for input(s): GLUCAP in the last 168 hours. ?Lipid Profile: ?No results for input(s): CHOL, HDL, LDLCALC, TRIG, CHOLHDL, LDLDIRECT in the last 72 hours. ?Thyroid Function Tests: ?No results for input(s): TSH, T4TOTAL, FREET4, T3FREE, THYROIDAB in the last 72 hours. ?Anemia Panel: ?No results for input(s): VITAMINB12, FOLATE, FERRITIN, TIBC, IRON, RETICCTPCT in the last 72 hours. ?Sepsis Labs: ?Recent Labs  ?Lab 04/12/21 ?1853  ?LATICACIDVEN 0.9  ? ? ?No results found for this or any previous visit (from the past 240 hour(s)).  ? ? ? ? ? ?Radiology Studies: ?CT Angio Chest PE W and/or Wo Contrast ? ?Result Date: 04/12/2021 ?CLINICAL DATA:  Sob, chest pain, general abd pain/cramping x 2 weeks, worse in last couple days. Elevated d-dimer. EXAM: CT ANGIOGRAPHY CHEST CT ABDOMEN AND PELVIS WITH CONTRAST TECHNIQUE: Multidetector CT imaging of the chest was performed using the standard protocol during bolus administration of intravenous contrast. Multiplanar CT image reconstructions and MIPs were obtained to evaluate the vascular anatomy. Multidetector CT imaging of the abdomen and pelvis was performed using the standard protocol during bolus administration of intravenous contrast. RADIATION DOSE REDUCTION: This exam was performed according to the departmental dose-optimization program which includes automated exposure control, adjustment of the mA and/or kV according to patient size and/or use of iterative reconstruction  technique. CONTRAST:  133m OMNIPAQUE IOHEXOL 350 MG/ML SOLN COMPARISON:  None. FINDINGS: CTA CHEST FINDINGS Cardiovascular: Satisfactory opacification of the pulmonary arteries to the segmental level. No evidence of pulmonary embolism. Normal heart size. No significant pericardial effusion. The thoracic aorta is normal in caliber. No atherosclerotic plaque of the thoracic aorta. No coronary artery calcifications. Mediastinum/Nodes: No enlarged mediastinal, hilar, or axillary lymph nodes. Thyroid gland, trachea, and esophagus demonstrate no significant findings. Pre-vascular 1.2 cm fluid density likely benign etiology. Lungs/Pleura: Expiratory phase of respiration. No focal consolidation. There is a 4 mm right upper lobe calcified nodule (6:43). There is a 5 mm right upper lobe pulmonary nodule (6:51). No pulmonary mass. No pleural effusion. No pneumothorax. Musculoskeletal: No chest wall abnormality. No suspicious lytic or blastic osseous lesions. No acute displaced fracture. Review of the MIP images confirms the above findings. CT ABDOMEN and PELVIS FINDINGS Hepatobiliary: No focal liver abnormality. Couple of approximately 5 mm hyperdense foci along the gallbladder lumen (2:27, 30). No gallbladder  wall thickening or pericholecystic fluid. No biliary dilatation. Pancreas: No focal lesion. Normal pancreatic contour. No surrounding inflammatory changes. No main pancreatic ductal dilatation. Spleen: Normal in size without focal abnormality. Adrenals/Urinary Tract: No adrenal nodule bilaterally. Bilateral kidneys enhance symmetrically. No definite nephrolithiasis.  No nephroureterolithiasis. No hydronephrosis. No hydroureter. The urinary bladder is unremarkable. Stomach/Bowel: Stomach is within normal limits. No evidence of small bowel wall thickening or dilatation. Fecalization material within the lumen of the terminal ileum consistent with a slow transition state. There is a 5 cm in length irregular bowel wall  thickening of distal descending colon with associated proximal increased stool burden throughout the colon as well as bowel wall thickening and pericolonic fat stranding. Appendix appears normal. Vascular/L

## 2021-04-14 NOTE — Progress Notes (Signed)
[facility] Progress Note ? ?1 Day Post-Op  ?Subjective: ?Doing ok. Flex sig with mass and tattoo distal.  ? ?Objective: ?Vital signs in last 24 hours: ?Temp:  [97.4 ?F (36.3 ?C)-98.2 ?F (36.8 ?C)] 97.4 ?F (36.3 ?C) (04/09 1230) ?Pulse Rate:  [89-103] 94 (04/09 1230) ?Resp:  [10-20] 16 (04/09 1230) ?BP: (136-176)/(78-110) 151/92 (04/09 1230) ?SpO2:  [94 %-100 %] 100 % (04/09 1230) ?Last BM Date : 04/12/21 ? ?Intake/Output from previous day: ?04/08 0701 - 04/09 0700 ?In: 1487.5 [I.V.:1487.5] ?Out: -  ?Intake/Output this shift: ?No intake/output data recorded. ? ?General appearance: alert and no distress ?GI: soft, distended, minimally tender  ? ?Lab Results:  ?Recent Labs  ?  04/13/21 ?6195 04/14/21 ?0932  ?WBC 13.7* 11.3*  ?HGB 13.6 12.6  ?HCT 40.5 36.6  ?PLT 306 344  ? ?BMET ?Recent Labs  ?  04/13/21 ?0454 04/14/21 ?6712  ?NA 138 140  ?K 3.6 2.9*  ?CL 105 106  ?CO2 24 24  ?GLUCOSE 165* 127*  ?BUN 12 15  ?CREATININE 0.74 0.75  ?CALCIUM 9.0 8.8*  ? ?PT/INR ?No results for input(s): LABPROT, INR in the last 72 hours. ? ?Studies/Results: ?CT Angio Chest PE W and/or Wo Contrast ? ?Result Date: 04/12/2021 ?CLINICAL DATA:  Sob, chest pain, general abd pain/cramping x 2 weeks, worse in last couple days. Elevated d-dimer. EXAM: CT ANGIOGRAPHY CHEST CT ABDOMEN AND PELVIS WITH CONTRAST TECHNIQUE: Multidetector CT imaging of the chest was performed using the standard protocol during bolus administration of intravenous contrast. Multiplanar CT image reconstructions and MIPs were obtained to evaluate the vascular anatomy. Multidetector CT imaging of the abdomen and pelvis was performed using the standard protocol during bolus administration of intravenous contrast. RADIATION DOSE REDUCTION: This exam was performed according to the departmental dose-optimization program which includes automated exposure control, adjustment of the mA and/or kV according to patient size and/or use of iterative reconstruction  technique. CONTRAST:  171m OMNIPAQUE IOHEXOL 350 MG/ML SOLN COMPARISON:  None. FINDINGS: CTA CHEST FINDINGS Cardiovascular: Satisfactory opacification of the pulmonary arteries to the segmental level. No evidence of pulmonary embolism. Normal heart size. No significant pericardial effusion. The thoracic aorta is normal in caliber. No atherosclerotic plaque of the thoracic aorta. No coronary artery calcifications. Mediastinum/Nodes: No enlarged mediastinal, hilar, or axillary lymph nodes. Thyroid gland, trachea, and esophagus demonstrate no significant findings. Pre-vascular 1.2 cm fluid density likely benign etiology. Lungs/Pleura: Expiratory phase of respiration. No focal consolidation. There is a 4 mm right upper lobe calcified nodule (6:43). There is a 5 mm right upper lobe pulmonary nodule (6:51). No pulmonary mass. No pleural effusion. No pneumothorax. Musculoskeletal: No chest wall abnormality. No suspicious lytic or blastic osseous lesions. No acute displaced fracture. Review of the MIP images confirms the above findings. CT ABDOMEN and PELVIS FINDINGS Hepatobiliary: No focal liver abnormality. Couple of approximately 5 mm hyperdense foci along the gallbladder lumen (2:27, 30). No gallbladder wall thickening or pericholecystic fluid. No biliary dilatation. Pancreas: No focal lesion. Normal pancreatic contour. No surrounding inflammatory changes. No main pancreatic ductal dilatation. Spleen: Normal in size without focal abnormality. Adrenals/Urinary Tract: No adrenal nodule bilaterally. Bilateral kidneys enhance symmetrically. No definite nephrolithiasis.  No nephroureterolithiasis. No hydronephrosis. No hydroureter. The urinary bladder is unremarkable. Stomach/Bowel: Stomach is within normal limits. No evidence of small bowel wall thickening or dilatation. Fecalization material within the lumen of the terminal ileum consistent with a slow transition state. There is a 5 cm in length irregular bowel wall  thickening of distal descending  colon with associated proximal increased stool burden throughout the colon as well as bowel wall thickening and pericolonic fat stranding. Appendix appears normal. Vascular/Lymphatic: No abdominal aorta or iliac aneurysm. Prominent but nonenlarged left lower quadrant cyst pericolonic mesenteric lymph nodes (2:50 5-59). No abdominal, pelvic, or inguinal lymphadenopathy. Reproductive: Status post hysterectomy. No adnexal masses. Other: Trace free fluid within the pelvis. No intraperitoneal free gas. No organized fluid collection. Musculoskeletal: No abdominal wall hernia or abnormality. No suspicious lytic or blastic osseous lesions. No acute displaced fracture. Review of the MIP images confirms the above findings. IMPRESSION: 1. 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 No bowel perforation. 2. Couple of indeterminate right upper lobe pulmonary nodules one calcified and one noncalcified measuring up to 5 mm. Recommend attention on follow-up. 3. Couple of approximately 5 mm hyperdense foci along the gallbladder lumen may represent gallbladder polyp or stone. Recommend right upper quadrant ultrasound further evaluation. 4. No pulmonary embolus. Electronically Signed   By: Iven Finn M.D.   On: 04/12/2021 17:54  ? ?CT ABDOMEN PELVIS W CONTRAST ? ?Result Date: 04/12/2021 ?CLINICAL DATA:  Sob, chest pain, general abd pain/cramping x 2 weeks, worse in last couple days. Elevated d-dimer. EXAM: CT ANGIOGRAPHY CHEST CT ABDOMEN AND PELVIS WITH CONTRAST TECHNIQUE: Multidetector CT imaging of the chest was performed using the standard protocol during bolus administration of intravenous contrast. Multiplanar CT image reconstructions and MIPs were obtained to evaluate the vascular anatomy. Multidetector CT imaging of the abdomen and pelvis was performed using the standard protocol during bolus administration of intravenous contrast. RADIATION  DOSE REDUCTION: This exam was performed according to the departmental dose-optimization program which includes automated exposure control, adjustment of the mA and/or kV according to patient size and/or use of iterative reconstruction technique. CONTRAST:  184m OMNIPAQUE IOHEXOL 350 MG/ML SOLN COMPARISON:  None. FINDINGS: CTA CHEST FINDINGS Cardiovascular: Satisfactory opacification of the pulmonary arteries to the segmental level. No evidence of pulmonary embolism. Normal heart size. No significant pericardial effusion. The thoracic aorta is normal in caliber. No atherosclerotic plaque of the thoracic aorta. No coronary artery calcifications. Mediastinum/Nodes: No enlarged mediastinal, hilar, or axillary lymph nodes. Thyroid gland, trachea, and esophagus demonstrate no significant findings. Pre-vascular 1.2 cm fluid density likely benign etiology. Lungs/Pleura: Expiratory phase of respiration. No focal consolidation. There is a 4 mm right upper lobe calcified nodule (6:43). There is a 5 mm right upper lobe pulmonary nodule (6:51). No pulmonary mass. No pleural effusion. No pneumothorax. Musculoskeletal: No chest wall abnormality. No suspicious lytic or blastic osseous lesions. No acute displaced fracture. Review of the MIP images confirms the above findings. CT ABDOMEN and PELVIS FINDINGS Hepatobiliary: No focal liver abnormality. Couple of approximately 5 mm hyperdense foci along the gallbladder lumen (2:27, 30). No gallbladder wall thickening or pericholecystic fluid. No biliary dilatation. Pancreas: No focal lesion. Normal pancreatic contour. No surrounding inflammatory changes. No main pancreatic ductal dilatation. Spleen: Normal in size without focal abnormality. Adrenals/Urinary Tract: No adrenal nodule bilaterally. Bilateral kidneys enhance symmetrically. No definite nephrolithiasis.  No nephroureterolithiasis. No hydronephrosis. No hydroureter. The urinary bladder is unremarkable. Stomach/Bowel: Stomach is  within normal limits. No evidence of small bowel wall thickening or dilatation. Fecalization material within the lumen of the terminal ileum consistent with a slow transition state. There is a 5 cm in length irre

## 2021-04-15 ENCOUNTER — Other Ambulatory Visit: Payer: Self-pay

## 2021-04-15 ENCOUNTER — Inpatient Hospital Stay (HOSPITAL_COMMUNITY): Payer: Self-pay | Admitting: Anesthesiology

## 2021-04-15 ENCOUNTER — Encounter (HOSPITAL_COMMUNITY)
Admission: EM | Disposition: A | Discharge status: Home / Self Care | Payer: Self-pay | Source: Home / Self Care | Attending: General Surgery

## 2021-04-15 DIAGNOSIS — K56609 Unspecified intestinal obstruction, unspecified as to partial versus complete obstruction: Secondary | ICD-10-CM

## 2021-04-15 DIAGNOSIS — K56601 Complete intestinal obstruction, unspecified as to cause: Secondary | ICD-10-CM

## 2021-04-15 HISTORY — PX: PARTIAL COLECTOMY: SHX5273

## 2021-04-15 LAB — BASIC METABOLIC PANEL
Anion gap: 8 (ref 5–15)
BUN: 17 mg/dL (ref 6–20)
CO2: 23 mmol/L (ref 22–32)
Calcium: 8.2 mg/dL — ABNORMAL LOW (ref 8.9–10.3)
Chloride: 108 mmol/L (ref 98–111)
Creatinine, Ser: 0.66 mg/dL (ref 0.44–1.00)
GFR, Estimated: >60 mL/min (ref >60)
Glucose, Bld: 138 mg/dL — ABNORMAL HIGH (ref 70–99)
Potassium: 3.2 mmol/L — ABNORMAL LOW (ref 3.5–5.1)
Sodium: 139 mmol/L (ref 135–145)

## 2021-04-15 LAB — CBC
HCT: 40 % (ref 36.0–46.0)
Hemoglobin: 13.3 g/dL (ref 12.0–15.0)
MCH: 27.8 pg (ref 26.0–34.0)
MCHC: 33.3 g/dL (ref 30.0–36.0)
MCV: 83.7 fL (ref 80.0–100.0)
Platelets: 323 10*3/uL (ref 150–400)
RBC: 4.78 MIL/uL (ref 3.87–5.11)
RDW: 14.5 % (ref 11.5–15.5)
WBC: 12.5 10*3/uL — ABNORMAL HIGH (ref 4.0–10.5)
nRBC: 0 % (ref 0.0–0.2)

## 2021-04-15 LAB — CA 125: Cancer Antigen (CA) 125: 10.2 U/mL (ref 0.0–38.1)

## 2021-04-15 LAB — MAGNESIUM: Magnesium: 2 mg/dL (ref 1.7–2.4)

## 2021-04-15 LAB — CEA: CEA: 2.5 ng/mL (ref 0.0–4.7)

## 2021-04-15 SURGERY — COLECTOMY, PARTIAL
Anesthesia: General | Site: Abdomen

## 2021-04-15 MED ORDER — SUGAMMADEX SODIUM 200 MG/2ML IV SOLN
INTRAVENOUS | Status: DC | PRN
Start: 2021-04-15 — End: 2021-04-15
  Administered 2021-04-15: 200 mg via INTRAVENOUS

## 2021-04-15 MED ORDER — BUPIVACAINE LIPOSOME 1.3 % IJ SUSP
INTRAMUSCULAR | Status: DC | PRN
  Administered 2021-04-15: 20 mL

## 2021-04-15 MED ORDER — SODIUM CHLORIDE 0.9 % IR SOLN
Status: DC | PRN
  Administered 2021-04-15 (×5): 1000 mL

## 2021-04-15 MED ORDER — LIDOCAINE 2% (20 MG/ML) 5 ML SYRINGE
INTRAMUSCULAR | Status: DC | PRN
  Administered 2021-04-15: 100 mg via INTRAVENOUS

## 2021-04-15 MED ORDER — MIDAZOLAM HCL 2 MG/2ML IJ SOLN
INTRAMUSCULAR | Status: AC
  Administered 2021-04-15: 2 mg
  Filled 2021-04-15: qty 2

## 2021-04-15 MED ORDER — LIDOCAINE HCL (PF) 2 % IJ SOLN
INTRAMUSCULAR | Status: AC
  Filled 2021-04-15: qty 5

## 2021-04-15 MED ORDER — SUCCINYLCHOLINE CHLORIDE 200 MG/10ML IV SOSY
PREFILLED_SYRINGE | INTRAVENOUS | Status: DC | PRN
  Administered 2021-04-15: 100 mg via INTRAVENOUS

## 2021-04-15 MED ORDER — SODIUM CHLORIDE 0.9 % IV SOLN: INTRAVENOUS | Status: DC

## 2021-04-15 MED ORDER — METOPROLOL TARTRATE 5 MG/5ML IV SOLN
5.0000 mg | Freq: Four times a day (QID) | INTRAVENOUS | Status: DC | PRN
  Administered 2021-04-16 – 2021-04-17 (×2): 5 mg via INTRAVENOUS
  Filled 2021-04-15 (×3): qty 5

## 2021-04-15 MED ORDER — PROCHLORPERAZINE EDISYLATE 10 MG/2ML IJ SOLN
10.0000 mg | Freq: Four times a day (QID) | INTRAMUSCULAR | Status: DC | PRN
  Administered 2021-04-17 (×2): 10 mg via INTRAVENOUS
  Filled 2021-04-15 (×2): qty 2

## 2021-04-15 MED ORDER — DEXAMETHASONE SODIUM PHOSPHATE 4 MG/ML IJ SOLN
INTRAMUSCULAR | Status: DC | PRN
  Administered 2021-04-15: 5 mg via INTRAVENOUS

## 2021-04-15 MED ORDER — PROPOFOL 10 MG/ML IV BOLUS
INTRAVENOUS | Status: DC | PRN
  Administered 2021-04-15: 200 mg via INTRAVENOUS

## 2021-04-15 MED ORDER — FENTANYL CITRATE (PF) 250 MCG/5ML IJ SOLN
INTRAMUSCULAR | Status: AC
  Filled 2021-04-15: qty 5

## 2021-04-15 MED ORDER — POVIDONE-IODINE 10 % EX OINT
TOPICAL_OINTMENT | CUTANEOUS | Status: AC
  Filled 2021-04-15: qty 1

## 2021-04-15 MED ORDER — ROCURONIUM BROMIDE 10 MG/ML (PF) SYRINGE
PREFILLED_SYRINGE | INTRAVENOUS | Status: DC | PRN
  Administered 2021-04-15: 20 mg via INTRAVENOUS
  Administered 2021-04-15: 10 mg via INTRAVENOUS
  Administered 2021-04-15: 50 mg via INTRAVENOUS

## 2021-04-15 MED ORDER — ROCURONIUM BROMIDE 10 MG/ML (PF) SYRINGE
PREFILLED_SYRINGE | INTRAVENOUS | Status: AC
  Filled 2021-04-15: qty 10

## 2021-04-15 MED ORDER — SODIUM CHLORIDE FLUSH 0.9 % IV SOLN
INTRAVENOUS | Status: AC
  Filled 2021-04-15: qty 10

## 2021-04-15 MED ORDER — LACTATED RINGERS IV SOLN: INTRAVENOUS | Status: DC

## 2021-04-15 MED ORDER — ONDANSETRON HCL 4 MG/2ML IJ SOLN
INTRAMUSCULAR | Status: AC
  Filled 2021-04-15: qty 2

## 2021-04-15 MED ORDER — ACETAMINOPHEN 650 MG RE SUPP: 650.0000 mg | Freq: Four times a day (QID) | RECTAL | Status: DC | PRN

## 2021-04-15 MED ORDER — LORAZEPAM 2 MG/ML IJ SOLN
1.0000 mg | INTRAMUSCULAR | Status: DC | PRN
  Administered 2021-04-15 – 2021-04-17 (×4): 1 mg via INTRAVENOUS
  Filled 2021-04-15 (×4): qty 1

## 2021-04-15 MED ORDER — MEPERIDINE HCL 50 MG/ML IJ SOLN: 6.2500 mg | INTRAMUSCULAR | Status: DC | PRN

## 2021-04-15 MED ORDER — PROCHLORPERAZINE MALEATE 5 MG PO TABS: 10.0000 mg | ORAL_TABLET | Freq: Four times a day (QID) | ORAL | Status: DC | PRN

## 2021-04-15 MED ORDER — POVIDONE-IODINE 10 % OINT PACKET
TOPICAL_OINTMENT | CUTANEOUS | Status: DC | PRN
  Administered 2021-04-15: 1 via TOPICAL

## 2021-04-15 MED ORDER — ONDANSETRON HCL 4 MG/2ML IJ SOLN
INTRAMUSCULAR | Status: DC | PRN
Start: 2021-04-15 — End: 2021-04-15
  Administered 2021-04-15: 4 mg via INTRAVENOUS

## 2021-04-15 MED ORDER — DIPHENHYDRAMINE HCL 25 MG PO CAPS: 25.0000 mg | ORAL_CAPSULE | Freq: Four times a day (QID) | ORAL | Status: DC | PRN

## 2021-04-15 MED ORDER — MIDAZOLAM HCL 2 MG/2ML IJ SOLN
INTRAMUSCULAR | Status: AC
  Filled 2021-04-15: qty 2

## 2021-04-15 MED ORDER — DEXAMETHASONE SODIUM PHOSPHATE 10 MG/ML IJ SOLN
INTRAMUSCULAR | Status: AC
  Filled 2021-04-15: qty 1

## 2021-04-15 MED ORDER — FENTANYL CITRATE (PF) 250 MCG/5ML IJ SOLN
INTRAMUSCULAR | Status: DC | PRN
  Administered 2021-04-15: 100 ug via INTRAVENOUS
  Administered 2021-04-15 (×2): 50 ug via INTRAVENOUS
  Administered 2021-04-15: 100 ug via INTRAVENOUS
  Administered 2021-04-15: 50 ug via INTRAVENOUS

## 2021-04-15 MED ORDER — ENOXAPARIN SODIUM 40 MG/0.4ML IJ SOSY
40.0000 mg | PREFILLED_SYRINGE | INTRAMUSCULAR | Status: DC
  Administered 2021-04-16 – 2021-04-18 (×3): 40 mg via SUBCUTANEOUS
  Filled 2021-04-15 (×3): qty 0.4

## 2021-04-15 MED ORDER — SIMETHICONE 80 MG PO CHEW: 40.0000 mg | CHEWABLE_TABLET | Freq: Four times a day (QID) | ORAL | Status: DC | PRN

## 2021-04-15 MED ORDER — OXYCODONE HCL 5 MG PO TABS: 5.0000 mg | ORAL_TABLET | ORAL | Status: DC | PRN

## 2021-04-15 MED ORDER — ONDANSETRON HCL 4 MG/2ML IJ SOLN
4.0000 mg | Freq: Four times a day (QID) | INTRAMUSCULAR | Status: DC | PRN
  Filled 2021-04-15: qty 2

## 2021-04-15 MED ORDER — FENTANYL CITRATE (PF) 100 MCG/2ML IJ SOLN
INTRAMUSCULAR | Status: AC
Start: 2021-04-15 — End: ?
  Filled 2021-04-15: qty 2

## 2021-04-15 MED ORDER — MIDAZOLAM HCL 2 MG/2ML IJ SOLN: 2.0000 mg | Freq: Once | INTRAMUSCULAR | Status: DC

## 2021-04-15 MED ORDER — HYDROMORPHONE HCL 1 MG/ML IJ SOLN
0.2500 mg | INTRAMUSCULAR | Status: DC | PRN
  Administered 2021-04-15 (×2): 0.5 mg via INTRAVENOUS
  Filled 2021-04-15 (×2): qty 0.5

## 2021-04-15 MED ORDER — CHLORHEXIDINE GLUCONATE 0.12 % MT SOLN
15.0000 mL | Freq: Once | OROMUCOSAL | Status: AC
  Administered 2021-04-15: 15 mL via OROMUCOSAL

## 2021-04-15 MED ORDER — DIPHENHYDRAMINE HCL 50 MG/ML IJ SOLN: 25.0000 mg | Freq: Four times a day (QID) | INTRAMUSCULAR | Status: DC | PRN

## 2021-04-15 MED ORDER — PROPOFOL 10 MG/ML IV BOLUS
INTRAVENOUS | Status: AC
  Filled 2021-04-15: qty 20

## 2021-04-15 MED ORDER — ACETAMINOPHEN 325 MG PO TABS
650.0000 mg | ORAL_TABLET | Freq: Four times a day (QID) | ORAL | Status: DC | PRN
Start: 2021-04-15 — End: 2021-04-18

## 2021-04-15 MED ORDER — ORAL CARE MOUTH RINSE: 15.0000 mL | Freq: Once | OROMUCOSAL | Status: AC

## 2021-04-15 MED ORDER — BUPIVACAINE LIPOSOME 1.3 % IJ SUSP
INTRAMUSCULAR | Status: AC
  Filled 2021-04-15: qty 20

## 2021-04-15 MED ORDER — HYDROMORPHONE HCL 1 MG/ML IJ SOLN
1.0000 mg | INTRAMUSCULAR | Status: DC | PRN
  Administered 2021-04-15 – 2021-04-17 (×9): 1 mg via INTRAVENOUS
  Filled 2021-04-15 (×10): qty 1

## 2021-04-15 MED ORDER — ONDANSETRON 4 MG PO TBDP: 4.0000 mg | ORAL_TABLET | Freq: Four times a day (QID) | ORAL | Status: DC | PRN

## 2021-04-15 MED ORDER — LACTATED RINGERS IV BOLUS
1000.0000 mL | Freq: Once | INTRAVENOUS | Status: AC
  Administered 2021-04-15: 1000 mL via INTRAVENOUS

## 2021-04-15 MED ORDER — SUCCINYLCHOLINE CHLORIDE 200 MG/10ML IV SOSY
PREFILLED_SYRINGE | INTRAVENOUS | Status: AC
  Filled 2021-04-15: qty 10

## 2021-04-15 SURGICAL SUPPLY — 59 items
APL PRP STRL LF DISP 70% ISPRP (MISCELLANEOUS) ×2
APL SKNCLS STERI-STRIP NONHPOA (GAUZE/BANDAGES/DRESSINGS) ×2
APL SWBSTK 6 STRL LF DISP (MISCELLANEOUS) ×2
APPLICATOR COTTON TIP 6 STRL (MISCELLANEOUS) ×1 IMPLANT
APPLICATOR COTTON TIP 6IN STRL (MISCELLANEOUS) ×3 IMPLANT
APPLIER CLIP 13 LRG OPEN (CLIP) ×3
APR CLP LRG 13 20 CLIP (CLIP) ×2
BENZOIN TINCTURE PRP APPL 2/3 (GAUZE/BANDAGES/DRESSINGS) ×3 IMPLANT
CHLORAPREP W/TINT 26 (MISCELLANEOUS) ×3 IMPLANT
CLIP APPLIE 13 LRG OPEN (CLIP) ×1 IMPLANT
CLOTH BEACON ORANGE TIMEOUT ST (SAFETY) ×3 IMPLANT
COVER LIGHT HANDLE STERIS (MISCELLANEOUS) ×6 IMPLANT
COVER MAYO STAND XLG (MISCELLANEOUS) ×2 IMPLANT
DRAPE HALF SHEET 40X57 (DRAPES) ×4 IMPLANT
DRAPE UTILITY W/TAPE 26X15 (DRAPES) ×4 IMPLANT
DRAPE WARM FLUID 44X44 (DRAPES) ×3 IMPLANT
DRSG OPSITE POSTOP 4X10 (GAUZE/BANDAGES/DRESSINGS) ×2 IMPLANT
ELECT BLADE 6 FLAT ULTRCLN (ELECTRODE) ×3 IMPLANT
ELECT REM PT RETURN 9FT ADLT (ELECTROSURGICAL) ×3
ELECTRODE REM PT RTRN 9FT ADLT (ELECTROSURGICAL) ×2 IMPLANT
GAUZE SPONGE 4X4 12PLY STRL (GAUZE/BANDAGES/DRESSINGS) ×3 IMPLANT
GLOVE BIOGEL PI IND STRL 7.0 (GLOVE) ×1 IMPLANT
GLOVE BIOGEL PI INDICATOR 7.0 (GLOVE) ×1
GLOVE SURG POLYISO LF SZ6.5 (GLOVE) ×4 IMPLANT
GLOVE SURG POLYISO LF SZ7 (GLOVE) ×4 IMPLANT
GLOVE SURG POLYISO LF SZ7.5 (GLOVE) ×8 IMPLANT
GLOVE SURG SS PI 7.5 STRL IVOR (GLOVE) ×2 IMPLANT
GLOVE SURG UNDER POLY LF SZ7 (GLOVE) ×12 IMPLANT
GOWN STRL REUS W/ TWL LRG LVL3 (GOWN DISPOSABLE) ×1 IMPLANT
GOWN STRL REUS W/TWL LRG LVL3 (GOWN DISPOSABLE) ×20 IMPLANT
HANDLE SUCTION POOLE (INSTRUMENTS) ×2 IMPLANT
INST SET MAJOR GENERAL (KITS) ×3 IMPLANT
KIT BLADEGUARD II DBL (SET/KITS/TRAYS/PACK) ×2 IMPLANT
KIT TURNOVER KIT A (KITS) ×3 IMPLANT
LIGASURE IMPACT 36 18CM CVD LR (INSTRUMENTS) ×3 IMPLANT
MANIFOLD NEPTUNE II (INSTRUMENTS) ×3 IMPLANT
NDL HYPO 21X1.5 SAFETY (NEEDLE) ×1 IMPLANT
NEEDLE HYPO 21X1.5 SAFETY (NEEDLE) ×3 IMPLANT
NS IRRIG 1000ML POUR BTL (IV SOLUTION) ×12 IMPLANT
PACK ABDOMINAL MAJOR (CUSTOM PROCEDURE TRAY) ×3 IMPLANT
PAD ARMBOARD 7.5X6 YLW CONV (MISCELLANEOUS) ×3 IMPLANT
PENCIL SMOKE EVACUATOR (MISCELLANEOUS) ×5 IMPLANT
RELOAD PROXIMATE 75MM BLUE (ENDOMECHANICALS) ×6 IMPLANT
RELOAD STAPLE 75 3.8 BLU REG (ENDOMECHANICALS) IMPLANT
RETRACTOR WND ALEXIS-O 25 LRG (MISCELLANEOUS) ×2 IMPLANT
RETRACTOR WOUND ALXS 34CM XLRG (MISCELLANEOUS) ×1 IMPLANT
RTRCTR WOUND ALEXIS 34CM XLRG (MISCELLANEOUS) ×3
RTRCTR WOUND ALEXIS O 25CM LRG (MISCELLANEOUS) ×3
SET BASIN LINEN APH (SET/KITS/TRAYS/PACK) ×3 IMPLANT
SPONGE T-LAP 18X18 ~~LOC~~+RFID (SPONGE) ×12 IMPLANT
STAPLER GUN LINEAR PROX 60 (STAPLE) ×2 IMPLANT
STAPLER PROXIMATE 75MM BLUE (STAPLE) ×2 IMPLANT
STAPLER VISISTAT (STAPLE) ×3 IMPLANT
SUCTION POOLE HANDLE (INSTRUMENTS) ×3
SUT CHROMIC 3 0 PS 2 (SUTURE) ×8 IMPLANT
SUT PDS AB 0 CTX 60 (SUTURE) ×6 IMPLANT
SUT SILK 3 0 SH CR/8 (SUTURE) ×5 IMPLANT
SYR 20ML LL LF (SYRINGE) ×6 IMPLANT
TRAY FOLEY SLVR 16FR LF STAT (SET/KITS/TRAYS/PACK) ×3 IMPLANT

## 2021-04-15 NOTE — TOC Progression Note (Signed)
?  Transition of Care (TOC) Screening Note ? ? ?Patient Details  ?Name: Alison Ramsey ?Date of Birth: 31-Jul-1970 ? ? ?Transition of Care (TOC) CM/SW Contact:    ?Boneta Lucks, RN ?Phone Number: ?04/15/2021, 11:39 AM ? ?Patient in surgery, COLECTOMY WITH COLOSTOMY CREATION  ? ?Patient has no insurance. ? ?Transition of Care Department [facility]) has reviewed patient and no TOC needs have been identified at this time. We will continue to monitor patient advancement through interdisciplinary progression rounds. If new patient transition needs arise, please place a TOC consult. ?  ?Barriers to Discharge: Continued Medical Work up ? ?

## 2021-04-15 NOTE — Op Note (Signed)
Patient:  Alison Ramsey ? ?DOB:  [birth year] ? ?MRN:  [id number] ? ? ?Preop Diagnosis: Obstructing colonic neoplasm ? ?Postop Diagnosis: Same ? ?Procedure: Extended right hemicolectomy, splenic flexure takedown ? ?Surgeon: Aviva Signs, MD ? ?Anes: General endotracheal ? ?Indications: Patient is a 51 year old white female who presented to [place] with an obstructing colonic mass in the descending colon.  Flexible sigmoidoscopy was performed which revealed an obstructing colon mass in the descending colon region.  Biopsies are pending.  The patient now presents for a partial colectomy with possible colostomy.  The risks and benefits of the procedure including bleeding, infection, cardiopulmonary difficulties, the possibility of blood transfusion, and the possibility of a colostomy were fully explained to the patient, who gave informed consent. ? ?Procedure note: The patient was placed in the supine position.  After induction of general endotracheal anesthesia, the abdomen was prepped and draped using usual sterile technique with ChloraPrep.  Surgical site confirmation was performed. ? ?A midline incision was made from the umbilicus to the suprapubic region.  The peritoneal cavity was entered into without difficulty.  Ascites was present.  I did palpate both the abdominal wall and the liver.  No abnormal lesions were noted.  The gallbladder was within normal limits.  The colon mass with the distal tattoo was noted in the mid ascending colon.  Proximal to this, the bowel was significantly dilated to the cecum with multiple areas of serosal tears, especially up towards the hepatic flexure.  Given these findings, it was elected to proceed with an extended right hemicolectomy.  The right colon and descending colon were mobilized along the peritoneal reflection.  The splenic flexure was taken down using the LigaSure.  The patient had a tortuous sigmoid colon and a GIA 75 stapler was placed across the proximal end.   Care was taken to avoid the left ureter.  The terminal ileum was found and a GIA 75 stapler was placed across it and fired.  The mesentery of the ascending colon, transverse colon, descending colon was divided using a LigaSure.  Any major vessels were ligated using large clips.  The omentum was freed away from the stomach.  The colon was then removed from the operative field.  I did not inspect the spleen and no abnormal bleeding was noted.  A side-to-side ileocolic anastomosis was performed using a GIA 75 stapler.  The enterotomy was closed using a TA 60 stapler.  The staple line was bolstered using 3-0 silk Lembert sutures.  The mesenteric defect was reapproximated using 3-0 Chromic Gut interrupted sutures.  The bowel was then returned into the abdominal cavity in an orderly fashion.  There was no spillage of enteric contents during the procedure.  The abdomen was copiously irrigated with normal saline.  The mesentery was inspected and no abnormal bleeding was noted.  All operating personnel then changed their gown and gloves.  A new set up was used for closure.  The fascia was reapproximated using a looped 0 PDS running suture.  The subcutaneous layer was irrigated with normal saline.  Exparel was instilled into the surrounding wound.  The skin was closed using staples.  Betadine ointment and a dry sterile dressing were applied. ? ?All tape and needle counts were correct at the end of the procedure.  The patient was extubated in the operating room and transferred to PACU in stable condition. ? ?Complications: None ? ?EBL: 300 cc ? ?Specimen:  Colon ? ? ?  ?

## 2021-04-15 NOTE — Transfer of Care (Signed)
Immediate Anesthesia Transfer of Care Note ? ?Patient: Alison Ramsey ? ?Procedure(s) Performed: EXTENDED RIGHT HEMICOLECTOMY (Abdomen) ? ?Patient Location: PACU ? ?Anesthesia Type:General ? ?Level of Consciousness: awake ? ?Airway & Oxygen Therapy: Patient Spontanous Breathing and Patient connected to face mask oxygen ? ?Post-op Assessment: Report given to RN and Post -op Vital signs reviewed and stable ? ?Post vital signs: Reviewed and stable ? ?Last Vitals:  ?Vitals Value Taken Time  ?BP    ?Temp    ?Pulse 104 04/15/21 1349  ?Resp    ?SpO2 95 % 04/15/21 1349  ?Vitals shown include unvalidated device data. ? ?Last Pain:  ?Vitals:  ? 04/15/21 0601  ?TempSrc:   ?PainSc: Asleep  ?   ? ?Patients Stated Pain Goal: 5 (04/13/21 1329) ? ?Complications: No notable events documented. ?

## 2021-04-15 NOTE — Interval H&P Note (Signed)
History and Physical Interval Note: ? ?04/15/2021 ?10:31 AM ? ?Alison Ramsey  has presented today for surgery, with the diagnosis of descending colon mass, obstruction.  The various methods of treatment have been discussed with the patient and family. After consideration of risks, benefits and other options for treatment, the patient has consented to  Procedure(s): ?COLECTOMY WITH COLOSTOMY CREATION/HARTMANN PROCEDURE (N/A) as a surgical intervention.  The patient's history has been reviewed, patient examined, no change in status, stable for surgery.  I have reviewed the patient's chart and labs.  Questions were answered to the patient's satisfaction.   ? ? ?Aviva Signs ? ? ?

## 2021-04-15 NOTE — Anesthesia Postprocedure Evaluation (Signed)
Anesthesia Post Note ? ?Patient: LUANN ASPINWALL ? ?Procedure(s) Performed: EXTENDED RIGHT HEMICOLECTOMY (Abdomen) ? ?Patient location during evaluation: PACU ?Anesthesia Type: General ?Level of consciousness: awake and alert and oriented ?Pain management: pain level controlled ?Vital Signs Assessment: post-procedure vital signs reviewed and stable ?Respiratory status: spontaneous breathing, nonlabored ventilation and respiratory function stable ?Cardiovascular status: blood pressure returned to baseline and stable ?Postop Assessment: no apparent nausea or vomiting ?Anesthetic complications: no ? ? ?No notable events documented. ? ? ?Last Vitals:  ?Vitals:  ? 04/15/21 1420 04/15/21 1430  ?BP:  (!) 166/95  ?Pulse:  (!) 105  ?Resp:  13  ?Temp:    ?SpO2: 92% 92%  ?  ?Last Pain:  ?Vitals:  ? 04/15/21 1420  ?TempSrc:   ?PainSc: Asleep  ? ? ?  ?  ?  ?  ?  ?  ? ?Liron Eissler C Trayton Szabo ? ? ? ? ?

## 2021-04-15 NOTE — Anesthesia Preprocedure Evaluation (Addendum)
Anesthesia Evaluation  ?Patient identified by MRN, date of birth, ID band ?Patient awake ? ? ? ?Reviewed: ?Allergy & Precautions, NPO status , Patient's Chart, lab work & pertinent test results ? ?Airway ?Mallampati: II ? ?TM Distance: >3 FB ?Neck ROM: Full ? ?Mouth opening: Limited Mouth Opening ? Dental ? ?(+) Dental Advisory Given ?NO NOTABLE DENTAL INJURY:   ?Pulmonary ?neg pulmonary ROS,  ?  ?Pulmonary exam normal ?breath sounds clear to auscultation ? ? ? ? ? ? Cardiovascular ?Exercise Tolerance: Good ?hypertension, Pt. on medications ?Normal cardiovascular exam ?Rhythm:Regular Rate:Normal ? ? ?  ?Neuro/Psych ?negative neurological ROS ? negative psych ROS  ? GI/Hepatic ?negative GI ROS, Neg liver ROS,   ?Endo/Other  ?negative endocrine ROS ? Renal/GU ?negative Renal ROS  ?negative genitourinary ?  ?Musculoskeletal ?negative musculoskeletal ROS ?(+)  ? Abdominal ?  ?Peds ?negative pediatric ROS ?(+)  Hematology ?negative hematology ROS ?(+)   ?Anesthesia Other Findings ? ? Reproductive/Obstetrics ?negative OB ROS ? ?  ? ? ? ? ? ? ? ? ? ? ? ? ? ?  ?  ? ? ? ? ? ? ? ? ?Anesthesia Physical ? ?Anesthesia Plan ? ?ASA: 2 ? ?Anesthesia Plan: General  ? ?Post-op Pain Management: Dilaudid IV  ? ?Induction: Intravenous, Rapid sequence and Cricoid pressure planned ? ?PONV Risk Score and Plan: 3 and Dexamethasone ? ?Airway Management Planned: Oral ETT and Video Laryngoscope Planned ? ?Additional Equipment:  ? ?Intra-op Plan:  ? ?Post-operative Plan: Extubation in OR and Possible Post-op intubation/ventilation ? ?Informed Consent: I have reviewed the patients History and Physical, chart, labs and discussed the procedure including the risks, benefits and alternatives for the proposed anesthesia with the patient or authorized representative who has indicated his/her understanding and acceptance.  ? ? ? ?Dental advisory given ? ?Plan Discussed with: CRNA and Surgeon ? ?Anesthesia Plan  Comments:   ? ? ? ? ? ?Anesthesia Quick Evaluation ? ?

## 2021-04-15 NOTE — Anesthesia Procedure Notes (Signed)
Procedure Name: Intubation ?Date/Time: 04/15/2021 11:13 AM ?Performed by: Orlie Dakin, CRNA ?Pre-anesthesia Checklist: Patient identified, Emergency Drugs available, Suction available and Patient being monitored ?Patient Re-evaluated:Patient Re-evaluated prior to induction ?Oxygen Delivery Method: Circle system utilized ?Preoxygenation: Pre-oxygenation with 100% oxygen ?Induction Type: IV induction, Rapid sequence and Cricoid Pressure applied ?Laryngoscope Size: Sabra Heck and 3 ?Grade View: Grade II ?Tube type: Oral ?Tube size: 7.0 mm ?Number of attempts: 1 ?Airway Equipment and Method: Stylet ?Placement Confirmation: ETT inserted through vocal cords under direct vision, positive ETCO2 and breath sounds checked- equal and bilateral ?Secured at: 22 cm ?Tube secured with: Tape ?Dental Injury: Teeth and Oropharynx as per pre-operative assessment  ? ? ? ? ?

## 2021-04-16 LAB — BASIC METABOLIC PANEL
Anion gap: 7 (ref 5–15)
BUN: 16 mg/dL (ref 6–20)
CO2: 26 mmol/L (ref 22–32)
Calcium: 8.1 mg/dL — ABNORMAL LOW (ref 8.9–10.3)
Chloride: 108 mmol/L (ref 98–111)
Creatinine, Ser: 0.73 mg/dL (ref 0.44–1.00)
GFR, Estimated: >60 mL/min (ref >60)
Glucose, Bld: 135 mg/dL — ABNORMAL HIGH (ref 70–99)
Potassium: 3.5 mmol/L (ref 3.5–5.1)
Sodium: 141 mmol/L (ref 135–145)

## 2021-04-16 LAB — CBC
HCT: 36.6 % (ref 36.0–46.0)
Hemoglobin: 12.1 g/dL (ref 12.0–15.0)
MCH: 27.8 pg (ref 26.0–34.0)
MCHC: 33.1 g/dL (ref 30.0–36.0)
MCV: 84.1 fL (ref 80.0–100.0)
Platelets: 342 10*3/uL (ref 150–400)
RBC: 4.35 MIL/uL (ref 3.87–5.11)
RDW: 14.6 % (ref 11.5–15.5)
WBC: 12 10*3/uL — ABNORMAL HIGH (ref 4.0–10.5)
nRBC: 0 % (ref 0.0–0.2)

## 2021-04-16 LAB — MAGNESIUM: Magnesium: 2 mg/dL (ref 1.7–2.4)

## 2021-04-16 LAB — PHOSPHORUS: Phosphorus: 3.5 mg/dL (ref 2.5–4.6)

## 2021-04-16 MED ORDER — AMLODIPINE BESYLATE 5 MG PO TABS
5.0000 mg | ORAL_TABLET | Freq: Every day | ORAL | Status: DC
  Administered 2021-04-16: 5 mg via ORAL
  Filled 2021-04-16: qty 1

## 2021-04-16 NOTE — Progress Notes (Signed)
1 Day Post-Op  ?Subjective: ?Patient is comfortable this morning.  Minimal incisional pain.  Has not had a bowel movement or passed flatus yet.  She does feel rumblings in her stomach. ? ?Objective: ?Vital signs in last 24 hours: ?Temp:  [98.1 ?F (36.7 ?C)-99 ?F (37.2 ?C)] 98.4 ?F (36.9 ?C) (04/11 0912) ?Pulse Rate:  [100-116] 115 (04/11 0912) ?Resp:  [13-20] 18 (04/11 0526) ?BP: (135-184)/(84-115) 180/114 (04/11 0930) ?SpO2:  [90 %-100 %] 97 % (04/11 0912) ?Last BM Date : 04/12/21 ? ?Intake/Output from previous day: ?04/10 0701 - 04/11 0700 ?In: 3508.1 [P.O.:120; I.V.:3288.1; IV Piggyback:100] ?Out: 1100 [Urine:800; Blood:300] ?Intake/Output this shift: ?Total I/O ?In: 200 [P.O.:200] ?Out: -  ? ?General appearance: alert, cooperative, and no distress ?Resp: clear to auscultation bilaterally ?Cardio: regular rate and rhythm, S1, S2 normal, no murmur, click, rub or gallop ?GI: Soft, incision healing well.  No significant bowel sounds appreciated yet. ? ?Lab Results:  ?Recent Labs  ?  04/15/21 ?0639 04/16/21 ?6546  ?WBC 12.5* 12.0*  ?HGB 13.3 12.1  ?HCT 40.0 36.6  ?PLT 323 342  ? ?BMET ?Recent Labs  ?  04/15/21 ?0639 04/16/21 ?5035  ?NA 139 141  ?K 3.2* 3.5  ?CL 108 108  ?CO2 23 26  ?GLUCOSE 138* 135*  ?BUN 17 16  ?CREATININE 0.66 0.73  ?CALCIUM 8.2* 8.1*  ? ?PT/INR ?No results for input(s): LABPROT, INR in the last 72 hours. ? ?Studies/Results: ?No results found. ? ?Anti-infectives: ?Anti-infectives (From admission, onward)  ? ? Start     Dose/Rate Route Frequency Ordered Stop  ? 04/15/21 0600  cefoTEtan (CEFOTAN) 2 g in sodium chloride 0.9 % 100 mL IVPB       ? 2 g ?200 mL/hr over 30 Minutes Intravenous On call to O.R. 04/14/21 1255 04/15/21 1137  ? ?  ? ? ?Assessment/Plan: ?s/p Procedure(s): ?EXTENDED RIGHT HEMICOLECTOMY ?Impression: Stable on postoperative day 1.  Still with uncontrolled hypertension.  Will discuss with the hospitalist.  Awaiting full return of bowel function.  Awaiting pathology report.  Will  advance diet once bowel function returns. ? LOS: 4 days  ? ? ?Aviva Signs ?04/16/2021  ?

## 2021-04-16 NOTE — Progress Notes (Signed)
Patient resting comfortably with daughter at bedside. Patient is experiencing some belching and what she believes is some gas pain and rumblings in her stomach but no BMs or flatulence as of now. Patient has sat up in the chair several times today and walked the halls.  ?

## 2021-04-16 NOTE — Progress Notes (Signed)
?   04/16/21 0912  ?Assess: MEWS Score  ?Temp 98.4 ?F (36.9 ?C)  ?BP (!) 178/115  ?Pulse Rate (!) 115  ?SpO2 97 %  ?Assess: MEWS Score  ?MEWS Temp 0  ?MEWS Systolic 0  ?MEWS Pulse 2  ?MEWS RR 0  ?MEWS LOC 0  ?MEWS Score 2  ?MEWS Score Color Yellow  ?Assess: if the MEWS score is Yellow or Red  ?Were vital signs taken at a resting state? Yes  ?Focused Assessment No change from prior assessment  ?Jernard Reiber Detection of Sepsis Score *See Row Information* Low  ?MEWS guidelines implemented *See Row Information* Yes  ?Treat  ?MEWS Interventions Administered prn meds/treatments  ?Pain Scale 0-10  ?Pain Score 5  ?Take Vital Signs  ?Increase Vital Sign Frequency  Yellow: Q 2hr X 2 then Q 4hr X 2, if remains yellow, continue Q 4hrs  ?Escalate  ?MEWS: Escalate Yellow: discuss with charge nurse/RN and consider discussing with provider and RRT  ?Notify: Provider  ?Provider Name/Title MD Arnoldo Morale  ?Date Provider Notified 04/16/21  ?Time Provider Notified [id number]  ?Provider response  ?(Awaiting MD response)  ? ? ?

## 2021-04-16 NOTE — Plan of Care (Signed)
  Problem: Pain Managment: Goal: General experience of comfort will improve Outcome: Progressing   

## 2021-04-17 ENCOUNTER — Encounter (HOSPITAL_COMMUNITY): Payer: Self-pay | Admitting: General Surgery

## 2021-04-17 MED ORDER — METOPROLOL TARTRATE 50 MG PO TABS
50.0000 mg | ORAL_TABLET | Freq: Two times a day (BID) | ORAL | Status: DC
  Administered 2021-04-17 – 2021-04-18 (×3): 50 mg via ORAL
  Filled 2021-04-17 (×3): qty 1

## 2021-04-17 MED ORDER — AMLODIPINE BESYLATE 5 MG PO TABS
5.0000 mg | ORAL_TABLET | Freq: Two times a day (BID) | ORAL | Status: DC
  Administered 2021-04-17: 5 mg via ORAL
  Filled 2021-04-17: qty 1

## 2021-04-17 NOTE — Progress Notes (Signed)
2 Days Post-Op  ?Subjective: ?Patient has mild incisional pain.  She has multiple bowel movements. ? ?Objective: ?Vital signs in last 24 hours: ?Temp:  [98 ?F (36.7 ?C)-99.3 ?F (37.4 ?C)] 99.1 ?F (37.3 ?C) (04/12 0551) ?Pulse Rate:  [104-112] 106 (04/12 0558) ?Resp:  [19-20] 19 (04/12 0551) ?BP: (158-188)/(92-105) 188/105 (04/12 0551) ?SpO2:  [92 %-95 %] 94 % (04/12 0558) ?Last BM Date : 04/12/21 ? ?Intake/Output from previous day: ?04/11 0701 - 04/12 0700 ?In: 1336.8 [P.O.:640; I.V.:696.8] ?Out: -  ?Intake/Output this shift: ?No intake/output data recorded. ? ?General appearance: alert, cooperative, and no distress ?GI: Soft, incision healing well.  Bowel sounds active. ? ?Lab Results:  ?Recent Labs  ?  04/15/21 ?0639 04/16/21 ?9628  ?WBC 12.5* 12.0*  ?HGB 13.3 12.1  ?HCT 40.0 36.6  ?PLT 323 342  ? ?BMET ?Recent Labs  ?  04/15/21 ?0639 04/16/21 ?3662  ?NA 139 141  ?K 3.2* 3.5  ?CL 108 108  ?CO2 23 26  ?GLUCOSE 138* 135*  ?BUN 17 16  ?CREATININE 0.66 0.73  ?CALCIUM 8.2* 8.1*  ? ?PT/INR ?No results for input(s): LABPROT, INR in the last 72 hours. ? ?Studies/Results: ?No results found. ? ?Anti-infectives: ?Anti-infectives (From admission, onward)  ? ? Start     Dose/Rate Route Frequency Ordered Stop  ? 04/15/21 0600  cefoTEtan (CEFOTAN) 2 g in sodium chloride 0.9 % 100 mL IVPB       ? 2 g ?200 mL/hr over 30 Minutes Intravenous On call to O.R. 04/14/21 1255 04/15/21 1137  ? ?  ? ? ?Assessment/Plan: ?s/p Procedure(s): ?EXTENDED RIGHT HEMICOLECTOMY ?Impression: Stable on postoperative day 2.  Bowel function is started to return.  Still awaiting a final pathology report.  Will advance to soft diet.  Hypertension is being addressed. ? LOS: 5 days  ? ? ?Aviva Signs ?04/17/2021  ?

## 2021-04-18 MED ORDER — OXYCODONE-ACETAMINOPHEN 5-325 MG PO TABS: 1.0000 | ORAL_TABLET | ORAL | 0 refills | Status: DC | PRN

## 2021-04-18 MED ORDER — METOPROLOL TARTRATE 50 MG PO TABS: 50.0000 mg | ORAL_TABLET | Freq: Two times a day (BID) | ORAL | 2 refills | Status: DC

## 2021-04-18 MED ORDER — AMLODIPINE BESYLATE 10 MG PO TABS: 10.0000 mg | ORAL_TABLET | Freq: Every day | ORAL | 2 refills | Status: DC

## 2021-04-18 NOTE — Discharge Summary (Signed)
Physician Discharge Summary  ?Patient ID: ?Alison Ramsey ?MRN: [id number] ?DOB/AGE: 09/05/1970 51 y.o. ? ?Admit date: 04/12/2021 ?Discharge date: 04/18/2021 ? ?Admission Diagnoses: Obstructing colonic neoplasm ? ?Discharge Diagnoses: Adenocarcinoma of descending colon, T3, N1, M0 ?Principal Problem: ?  Bowel obstruction ([place]) ?Active Problems: ?  Hypertension ?  Colonic neoplasm ? ? ?Discharged Condition: good ? ?Hospital Course: Patient is a 51 year old white female who presented to the emergency room on 04/12/2021 with worsening nausea, vomiting, and abdominal pain.  A CT scan of the abdomen revealed a large bowel obstruction with a possible mass in the descending colon.  The patient underwent flexible sigmoidoscopy by Dr. Abbey Chatters of GI on 04/13/2021.  An obstructing neoplasm was found.  He could not pass the pediatric scope through the mass.  General surgery was consulted and the patient subsequently underwent an extended right hemicolectomy on 04/15/2021.  The patient's postoperative course was remarkable for malignant hypertension.  This had not been treated previously.  That ultimately was controlled with Tenormin and amlodipine.  Final pathology reveals a T3, N1, M0 adenocarcinoma of the colon.  The patient's diet was advanced without difficulty once her bowel function returned.  She is being discharged home on 04/18/2021 in good and improving condition. ? ?Treatments: surgery: Flexible sigmoidoscopy with biopsy by Dr. Abbey Chatters on 04/13/2021 ?Extended right hemicolectomy by Dr. Arnoldo Morale on 04/15/2021 ? ?Discharge Exam: ?Blood pressure (!) 156/89, pulse 77, temperature 98.3 ?F (36.8 ?C), resp. rate 15, height '5\' 5"'$  (1.651 m), weight 99.5 kg, SpO2 95 %. ?General appearance: alert, cooperative, and no distress ?Resp: clear to auscultation bilaterally ?Cardio: regular rate and rhythm, S1, S2 normal, no murmur, click, rub or gallop ?GI: Soft, incision healing well.  Active bowel sounds appreciated. ? ?Disposition: Discharge  disposition: 01-Home or Self Care ? ? ? ? ? ? ?Discharge Instructions   ? ? Diet - low sodium heart healthy   Complete by: As directed ?  ? Increase activity slowly   Complete by: As directed ?  ? ?  ? ?Allergies as of 04/18/2021   ? ?   Reactions  ? Aleve [naproxen] Hives  ? Ok with ibuprofen  ? Codeine Nausea And Vomiting, Rash  ? hives  ? Latex Rash  ? ?  ? ?  ?Medication List  ?  ? ?STOP taking these medications   ? ?albuterol 108 (90 Base) MCG/ACT inhaler ?Commonly known as: VENTOLIN HFA ?  ?hydrochlorothiazide 25 MG tablet ?Commonly known as: HYDRODIURIL ?  ? ?  ? ?TAKE these medications   ? ?acetaminophen 500 MG tablet ?Commonly known as: TYLENOL ?Take 1,000 mg by mouth every 6 (six) hours as needed for mild pain or headache. ?  ?ACIDOPHILUS PO ?Take 1 capsule by mouth daily. ?  ?amLODipine 10 MG tablet ?Commonly known as: NORVASC ?Take 1 tablet (10 mg total) by mouth daily. ?  ?ASHWAGANDHA PO ?Take 1 tablet by mouth daily. ?  ?EPINEPHrine 0.3 mg/0.3 mL Soaj injection ?Commonly known as: EPI-PEN ?Inject 0.3 mg into the muscle as needed for anaphylaxis. ?  ?FIBER PO ?Take 1 tablet by mouth daily. ?  ?ibuprofen 200 MG tablet ?Commonly known as: ADVIL ?Take 400 mg by mouth every 6 (six) hours as needed for headache or mild pain. ?  ?MAGNESIUM PO ?Take 1 tablet by mouth daily. ?  ?metoprolol tartrate 50 MG tablet ?Commonly known as: LOPRESSOR ?Take 1 tablet (50 mg total) by mouth 2 (two) times daily. ?  ?MULTI-ENZYME PO ?Take 1 tablet by mouth daily. ?  ?  oxyCODONE-acetaminophen 5-325 MG tablet ?Commonly known as: Percocet ?Take 1 tablet by mouth every 4 (four) hours as needed for severe pain. ?  ?VITAMIN D PO ?Take 1 tablet by mouth daily. ?  ?ZINC PO ?Take 1 tablet by mouth daily. ?  ? ?  ? ? Follow-up Information   ? ? Aviva Signs, MD. Schedule an appointment as soon as possible for a visit on 04/25/2021.   ?Specialty: General Surgery ?Contact information: ?[address] ?[place]  [id number] ?[phone] ? ? ?  ?  ? ?  ?  ? ?  ? ? ?Signed: ?Aviva Signs ?04/18/2021, 10:02 AM ? ? ?

## 2021-04-18 NOTE — Plan of Care (Signed)
?  Problem: Nutrition: ?Goal: Adequate nutrition will be maintained ?Outcome: Progressing ?  ?Problem: Elimination: ?Goal: Will not experience complications related to bowel motility ?Outcome: Progressing ?Goal: Will not experience complications related to urinary retention ?Outcome: Progressing ?  ?Problem: Pain Managment: ?Goal: General experience of comfort will improve ?Outcome: Progressing ?  ?Problem: Safety: ?Goal: Ability to remain free from injury will improve ?Outcome: Progressing ?  ?Problem: Education: ?Goal: Knowledge of General Education information will improve ?Description: Including pain rating scale, medication(s)/side effects and non-pharmacologic comfort measures ?Outcome: Progressing ?  ?Problem: Health Behavior/Discharge Planning: ?Goal: Ability to manage health-related needs will improve ?Outcome: Progressing ?  ?Problem: Clinical Measurements: ?Goal: Ability to maintain clinical measurements within normal limits will improve ?Outcome: Progressing ?Goal: Will remain free from infection ?Outcome: Progressing ?Goal: Diagnostic test results will improve ?Outcome: Progressing ?Goal: Respiratory complications will improve ?Outcome: Progressing ?Goal: Cardiovascular complication will be avoided ?Outcome: Progressing ?  ?Problem: Activity: ?Goal: Risk for activity intolerance will decrease ?Outcome: Progressing ?  ?

## 2021-04-18 NOTE — Progress Notes (Signed)
Discharge instructions provided to patient. All medications, follow up appointments, and discharge instructions discussed. IV out. Monitor off. Discharging home with family. ?Era Bumpers, RN  ?

## 2021-04-19 ENCOUNTER — Telehealth: Payer: Self-pay | Admitting: *Deleted

## 2021-04-19 LAB — SURGICAL PATHOLOGY

## 2021-04-19 NOTE — Telephone Encounter (Signed)
Received call from patient [phone]~ telephone.  ? ?Surgical Date: 04/15/2021 ?Procedure: right hemicolectomy ? ?Patient states that she has not passed gas or had a BM since discharge home from hospital. States that she is in severe abdominal pain, but she also has pain under shoulder blades in back.  ? ?Advised that she needs to return to ER as not passing gas after colon surgery may be loss of bowel motility.  ? ?Verbalized understanding. Dr. Arnoldo Morale made aware.  ?

## 2021-04-25 ENCOUNTER — Ambulatory Visit (INDEPENDENT_AMBULATORY_CARE_PROVIDER_SITE_OTHER): Payer: Self-pay | Admitting: General Surgery

## 2021-04-25 ENCOUNTER — Other Ambulatory Visit: Payer: Self-pay

## 2021-04-25 ENCOUNTER — Encounter: Payer: Self-pay | Admitting: *Deleted

## 2021-04-25 ENCOUNTER — Encounter: Payer: Self-pay | Admitting: General Surgery

## 2021-04-25 VITALS — BP 112/80 | HR 104 | Temp 98.1°F | Resp 14 | Ht 65.0 in | Wt 202.0 lb

## 2021-04-25 DIAGNOSIS — D490 Neoplasm of unspecified behavior of digestive system: Secondary | ICD-10-CM

## 2021-04-25 DIAGNOSIS — Z09 Encounter for follow-up examination after completed treatment for conditions other than malignant neoplasm: Secondary | ICD-10-CM

## 2021-04-25 NOTE — Progress Notes (Signed)
Subjective:  ?  ? Alison Ramsey  ?Patient here for postoperative visit, status post extended right hemicolectomy for an obstructing colon carcinoma.  She is doing well.  Her blood pressure is much improved.  She does have some bowel urgency with certain foods, especially citrus.  She denies any fever or chills. ?Objective:  ? ? BP 112/80   Pulse (!) 104   Temp 98.1 ?F (36.7 ?C) (Oral)   Resp 14   Ht $R'5\' 5"'oE$  (1.651 m)   Wt 202 lb (91.6 kg)   SpO2 98%   BMI 33.61 kg/m?  ? ?General:  alert, cooperative, and no distress  ?Abdomen is soft, incision healing well.  Staples removed, Steri-Strips applied. ?Final pathology reveals a T3, N1B, M0 adenocarcinoma of the descending colon, normal mismatch repair protein present ?   ? ?Assessment:  ? ? Doing well postoperatively.  ?  ?Plan:  ? ?May use Imodium A-D as needed for bowel urgency.  Would follow-up with Evelina Dun concerning your new blood pressure medication.  Referral has been made to Dr. Delton Coombes of oncology.  I did discuss the possible need for port.  May increase activity as able.  May return to work without restrictions on 05/02/2021.  Follow-up here as needed. ?

## 2021-05-09 ENCOUNTER — Inpatient Hospital Stay (HOSPITAL_COMMUNITY): Payer: Self-pay | Attending: Hematology | Admitting: Hematology

## 2021-05-09 ENCOUNTER — Inpatient Hospital Stay (HOSPITAL_COMMUNITY): Payer: Self-pay

## 2021-05-09 ENCOUNTER — Encounter (HOSPITAL_COMMUNITY): Payer: Self-pay | Admitting: Hematology

## 2021-05-09 ENCOUNTER — Other Ambulatory Visit (HOSPITAL_COMMUNITY): Payer: Self-pay

## 2021-05-09 DIAGNOSIS — Z79899 Other long term (current) drug therapy: Secondary | ICD-10-CM

## 2021-05-09 DIAGNOSIS — Z803 Family history of malignant neoplasm of breast: Secondary | ICD-10-CM

## 2021-05-09 DIAGNOSIS — C182 Malignant neoplasm of ascending colon: Secondary | ICD-10-CM

## 2021-05-09 DIAGNOSIS — Z802 Family history of malignant neoplasm of other respiratory and intrathoracic organs: Secondary | ICD-10-CM

## 2021-05-09 DIAGNOSIS — Z87891 Personal history of nicotine dependence: Secondary | ICD-10-CM

## 2021-05-09 DIAGNOSIS — I10 Essential (primary) hypertension: Secondary | ICD-10-CM

## 2021-05-09 DIAGNOSIS — C189 Malignant neoplasm of colon, unspecified: Secondary | ICD-10-CM | POA: Insufficient documentation

## 2021-05-09 DIAGNOSIS — Z800 Family history of malignant neoplasm of digestive organs: Secondary | ICD-10-CM

## 2021-05-09 DIAGNOSIS — Z5111 Encounter for antineoplastic chemotherapy: Secondary | ICD-10-CM | POA: Insufficient documentation

## 2021-05-09 DIAGNOSIS — C186 Malignant neoplasm of descending colon: Secondary | ICD-10-CM

## 2021-05-09 LAB — COMPREHENSIVE METABOLIC PANEL
ALT: 15 U/L (ref 0–44)
AST: 15 U/L (ref 15–41)
Albumin: 3.7 g/dL (ref 3.5–5.0)
Alkaline Phosphatase: 68 U/L (ref 38–126)
Anion gap: 5 (ref 5–15)
BUN: 9 mg/dL (ref 6–20)
CO2: 26 mmol/L (ref 22–32)
Calcium: 8.9 mg/dL (ref 8.9–10.3)
Chloride: 106 mmol/L (ref 98–111)
Creatinine, Ser: 0.77 mg/dL (ref 0.44–1.00)
GFR, Estimated: >60 mL/min (ref >60)
Glucose, Bld: 102 mg/dL — ABNORMAL HIGH (ref 70–99)
Potassium: 4.7 mmol/L (ref 3.5–5.1)
Sodium: 137 mmol/L (ref 135–145)
Total Bilirubin: 0.5 mg/dL (ref 0.3–1.2)
Total Protein: 6.9 g/dL (ref 6.5–8.1)

## 2021-05-09 LAB — CBC WITH DIFFERENTIAL/PLATELET
Abs Immature Granulocytes: 0.03 10*3/uL (ref 0.00–0.07)
Basophils Absolute: 0 10*3/uL (ref 0.0–0.1)
Basophils Relative: 0 %
Eosinophils Absolute: 0.3 10*3/uL (ref 0.0–0.5)
Eosinophils Relative: 4 %
HCT: 36.4 % (ref 36.0–46.0)
Hemoglobin: 11.6 g/dL — ABNORMAL LOW (ref 12.0–15.0)
Immature Granulocytes: 0 %
Lymphocytes Relative: 22 %
Lymphs Abs: 1.6 10*3/uL (ref 0.7–4.0)
MCH: 26.8 pg (ref 26.0–34.0)
MCHC: 31.9 g/dL (ref 30.0–36.0)
MCV: 84.1 fL (ref 80.0–100.0)
Monocytes Absolute: 0.4 10*3/uL (ref 0.1–1.0)
Monocytes Relative: 5 %
Neutro Abs: 4.7 10*3/uL (ref 1.7–7.7)
Neutrophils Relative %: 69 %
Platelets: 281 10*3/uL (ref 150–400)
RBC: 4.33 MIL/uL (ref 3.87–5.11)
RDW: 14.4 % (ref 11.5–15.5)
WBC: 7 10*3/uL (ref 4.0–10.5)
nRBC: 0 % (ref 0.0–0.2)

## 2021-05-09 MED ORDER — CAPECITABINE 500 MG PO TABS
2000.0000 mg | ORAL_TABLET | Freq: Two times a day (BID) | ORAL | 3 refills | Status: DC
  Filled 2021-05-09: qty 112, 14d supply, fill #0

## 2021-05-09 NOTE — Patient Instructions (Addendum)
[place] ?Chemotherapy Teaching ? ? ?You are diagnosed with Stage III colon cancer. You will be treated with a chemotherapy regimen called CapeOx - the drugs included in this are capcitabine (Xeloda) and oxaliplatin.  You will take the Xeloda two weeks on and one week off. You will repeat this cycle every 3 weeks.  You will receive the oxaliplatin in the clinic every 21 days at the beginning of each cycle of chemotherapy.  The intent of treatment is cure. You will see the doctor regularly throughout treatment.  We will obtain blood work from you prior to every treatment and monitor your results to make sure it is safe to give your treatment. The doctor monitors your response to treatment by the way you are feeling, your blood work, and by obtaining scans periodically. There will be wait times while you are here for treatment.  It will take about 30 minutes to 1 hour for your lab work to result.  Then there will be wait times while pharmacy mixes your medications.  ? ? ?Medications you will receive in the clinic prior to your oxaliplatin administration: ? ?Aloxi:  ALOXI is used in adults to help prevent nausea and vomiting that happens with certain chemotherapy drugs.  Aloxi is a long acting medication, and will remain in your system for about two days.  ? ?Dexamethasone:  This is a steroid given prior to chemotherapy to help prevent allergic reactions; it may also help prevent and control nausea and diarrhea that chemotherapy can cause.   ? ? ?Capecitabine (Xeloda) ? ?About This Drug ?Capecitabine is used to treat cancer. It is given orally (by mouth).  You will take this drug for two weeks in a row, then have a one week break before restarting it again.  ? ?Possible Side Effects ? Decrease in red blood cells. This may make you feel more tired. ? ? Nausea and throwing up (vomiting) ? ? Pain in your abdomen ? ? Diarrhea (loose bowel movements) ? ? Tiredness and weakness ? ? Increased total bilirubin  in your blood. This may mean that you have changes in your liver function. ? ? Hand-foot syndrome. The palms of your hands or soles of your feet may tingle, become numb, painful, swollen, or red. ? ?Note: Each of the side effects above was reported in 30% or greater of patients treated with capecitabine. Not all possible side effects are included above. ? ?Warnings and Precautions ? Abnormal bleeding if you are taking blood thinners such as warfarin - symptoms may be coughing up blood, throwing up blood (may look like coffee grounds), red or black tarry bowel movements, abnormally heavy menstrual flow, nosebleeds or any other unusual bleeding. ? ? Severe diarrhea ? ? Changes in the tissue of the heart and/or heart attack. Some changes may happen that can cause your heart to have less ability to pump blood. ? ? Increased risk of severe side effects if you have a known dihydropyrimidine dehydrogenase deficiency ? ? Dehydration (lack of water in the body from losing too much fluid), which may affect how your kidneys work which can be life-threatening ? ? Severe allergic skin reaction. You may develop blisters on your skin that are filled with fluid or a severe red rash all over your body that may be painful. ? ? Decrease in the number of white blood cells, red blood cells, and platelets. This may raise your risk of infection, make you tired and weak (fatigue), and raise your risk  of bleeding. ? ? Patients greater than 76 years of age are at increased risk of severe and life-threatening side effects. ? ? Increased bilirubin and changes in your liver function, which can cause liver failure ? ?Note: Some of the side effects above are very rare. If you have concerns and/or questions, please discuss them with your medical team. ? ?How to Take Your Medication ? Swallow the medicine whole with water within 30 minutes after a meal. Do not break or crush it. ? ? Missed dose: If you vomit or miss a dose, take your next dose at  the regular time, and contact your doctor. Do not take 2 doses at the same time and do not double up on the next dose. ? ? Handling: Wash your hands after handling your medicine, your caretakers should not handle your medicine with bare hands and should wear latex gloves. ? This drug may be present in the saliva, tears, sweat, urine, stool, vomit, semen, and vaginal secretions. Talk to your doctor and/or your nurse about the necessary precautions to take during this time. ? ? Storage: Store this medicine in the original container at room temperature. Keep lid tightly closed. ? ? Disposal of unused medicine: Do not flush any expired and/or unused medicine down the toilet or drain unless you are specifically instructed to do so on the medication label. Some facilities have take-back programs and/or other options. If you do not have a take-back program in your area, then please discuss with your nurse or your doctor how to dispose of unused medicine. ? ?Treating Side Effects ? Drink plenty of fluids (a minimum of eight glasses per day is recommended). ? ? If you throw up or have loose bowel movements, you should drink more fluids so that you do not become dehydrated (lack of water in the body from losing too much fluid). ? ? If you have diarrhea, eat low-fiber foods that are high in protein and calories and avoid foods that can irritate your digestive tracts or lead to cramping. ? ? Ask your nurse or doctor about medicine that can lessen or stop your diarrhea. ? ? To help with nausea and vomiting, eat small, frequent meals instead of three large meals a day. Choose foods and drinks that are at room temperature. Ask your nurse or doctor about other helpful tips and medicine that is available to help stop or lessen these symptoms. ? ? Manage tiredness by pacing your activities for the day. ? ? Be sure to include periods of rest between energy-draining activities. ? ? To decrease the risk of infection, wash your hands  regularly. ? ? Avoid close contact with people who have a cold, the flu, or other infections. ? ? Take your temperature as your doctor or nurse tells you, and whenever you feel like you may have a fever. ? ? To help decrease the risk of bleeding, use a soft toothbrush. Check with your nurse before using dental floss. ? ? Be very careful when using knives or tools. ? ? Use an electric shaver instead of a razor. ? ? Keeping your pain under control is important to your well-being. Please tell your doctor or nurse if you are experiencing pain. ? ? Avoid sun exposure and apply sunscreen routinely when outdoors. ? ? If you get a rash do not put anything on it unless your doctor or nurse says you may. Keep the area around the rash clean and dry. Ask your doctor for medicine if your  rash bothers you. ? ?Food and Drug Interactions ? There are no known interactions of capecitabine with food, however this medication should be taken within 30 minutes after a meal. ? ? Check with your doctor or pharmacist about all other prescription medicines and over-the-counter medicines and dietary supplements (vitamins, minerals, herbs and others) you are taking before starting this medicine as there are known drug interactions with capecitabine. Also, check with your doctor or pharmacist before starting any new prescription or over-the-counter medicines, or dietary supplements to make sure that there are no interactions.  ? ? There are known interactions of capecitabine with blood thinning medicine such as warfarin. Ask your doctor what precautions you should take. ? ?When to Call the Doctor ? ?Call your doctor or nurse if you have any of these symptoms and/or any new or unusual symptoms: ? Fever of 100.4? F (38? C) or higher ? ? Chills ? ? Trouble breathing ? ? Feeling that your heart is beating in a fast or not normal way (palpitations) ? ? Pain in your chest ? ? Chest pain or symptoms of a heart attack. Most heart attacks involve pain  in the center of the chest that lasts more than a few minutes. The pain may go away and come back or it can be constant. It can feel like pressure, squeezing, fullness, or pain. Sometimes pain is felt in on

## 2021-05-09 NOTE — Patient Instructions (Signed)
? ? Alison Ramsey ? 05/09/2021  ?  ? '@PREFPERIOPPHARMACY'$ @ ? ? Your procedure is scheduled on 05/13/2021. ? Report to [facility] at 11:15 A.M. ? Call this number if you have problems the morning of surgery: ? [phone] ? ? Remember: ? Do not eat or drink after midnight. ?   ?Take these medicines the morning of surgery with A SIP OF WATER : Norvasc and Metoprolol ?  ? Do not wear jewelry, make-up or nail polish. ? Do not wear lotions, powders, or perfumes, or deodorant. ? Do not shave 48 hours prior to surgery.  Men may shave face and neck. ? Do not bring valuables to the hospital. ? [facility] is not responsible for any belongings or valuables. ? ?Contacts, dentures or bridgework may not be worn into surgery.  Leave your suitcase in the car.  After surgery it may be brought to your room. ? ?For patients admitted to the hospital, discharge time will be determined by your treatment team. ? ?Patients discharged the day of surgery will not be allowed to drive home.  ? ?Name and phone number of your driver:   Family ?Special instructions:  N/A ? ?Please read over the following fact sheets that you were given. ?Care and Recovery After Surgery ? ?General Anesthesia, Adult ?General anesthesia is the use of medicines to make a person "go to sleep" (unconscious) for a medical procedure. General anesthesia must be used for certain procedures, and is often recommended for procedures that: ?Last a long time. ?Require you to be still or in an unusual position. ?Are major and can cause blood loss. ?The medicines used for general anesthesia are called general anesthetics. As well as making you unconscious for a certain amount of time, these medicines: ?Prevent pain. ?Control your blood pressure. ?Relax your muscles. ?Tell a health care provider about: ?Any allergies you have. ?All medicines you are taking, including vitamins, herbs, eye drops, creams, and over-the-counter medicines. ?Any problems you or family members have had  with anesthetic medicines. ?Types of anesthetics you have had in the past. ?Any blood disorders you have. ?Any surgeries you have had. ?Any medical conditions you have. ?Any recent upper respiratory, chest, or ear infections. ?Any history of: ?Heart or lung conditions, such as heart failure, sleep apnea, asthma, or chronic obstructive pulmonary disease (COPD). ?Armed forces logistics/support/administrative officer. ?Depression or anxiety. ?Any tobacco or drug use, including marijuana or alcohol use. ?Whether you are pregnant or may be pregnant. ?What are the risks? ?Generally, this is a safe procedure. However, problems may occur, including: ?Allergic reaction. ?Lung and heart problems. ?Inhaling food or liquid from the stomach into the lungs (aspiration). ?Nerve injury. ?Dental injury. ?Air in the bloodstream, which can lead to stroke. ?Extreme agitation or confusion (delirium) when you wake up from the anesthetic. ?Waking up during your procedure and being unable to move. This is rare. ?These problems are more likely to develop if you are having a major surgery or if you have an advanced or serious medical condition. You can prevent some of these complications by answering all of your health care provider's questions thoroughly and by following all instructions before your procedure. ?General anesthesia can cause side effects, including: ?Nausea or vomiting. ?A sore throat from the breathing tube. ?Hoarseness. ?Wheezing or coughing. ?Shaking chills. ?Tiredness. ?Body aches. ?Anxiety. ?Sleepiness or drowsiness. ?Confusion or agitation. ?What happens before the procedure? ?Staying hydrated ?Follow instructions from your health care provider about hydration, which may include: ?Up to 2 hours before the procedure -  you may continue to drink clear liquids, such as water, clear fruit juice, black coffee, and plain tea. ? ?Eating and drinking restrictions ?Follow instructions from your health care provider about eating and drinking, which may include: ?8  hours before the procedure - stop eating heavy meals or foods such as meat, fried foods, or fatty foods. ?6 hours before the procedure - stop eating light meals or foods, such as toast or cereal. ?6 hours before the procedure - stop drinking milk or drinks that contain milk. ?2 hours before the procedure - stop drinking clear liquids. ?Medicines ?Ask your health care provider about: ?Changing or stopping your regular medicines. This is especially important if you are taking diabetes medicines or blood thinners. ?Taking medicines such as aspirin and ibuprofen. These medicines can thin your blood. Do not take these medicines unless your health care provider tells you to take them. ?Taking over-the-counter medicines, vitamins, herbs, and supplements. Do not take these during the week before your procedure unless your health care provider approves them. ?General instructions ?Starting 3-6 weeks before the procedure, do not use any products that contain nicotine or tobacco, such as cigarettes and e-cigarettes. If you need help quitting, ask your health care provider. ?If you brush your teeth on the morning of the procedure, make sure to spit out all of the toothpaste. ?Tell your health care provider if you become ill or develop a cold, cough, or fever. ?If instructed by your health care provider, bring your sleep apnea device with you on the day of your surgery (if applicable). ?Ask your health care provider if you will be going home the same day, the following day, or after a longer hospital stay. ?Plan to have a responsible adult take you home from the hospital or clinic. ?Plan to have a responsible adult care for you for the time you are told after you leave the hospital or clinic. This is important. ?What happens during the procedure? ? ?You will be given anesthetics through both of the following: ?A mask placed over your nose and mouth. ?An IV in one of your veins. ?You may receive a medicine to help you relax  (sedative). ?After you are unconscious, a breathing tube may be inserted down your throat to help you breathe. This will be removed before you wake up. ?An anesthesia specialist will stay with you throughout your procedure. He or she will: ?Keep you comfortable and safe by continuing to give you medicines and adjusting the amount of medicine that you get. ?Monitor your blood pressure, pulse, and oxygen levels to make sure that the anesthetics do not cause any problems. ?The procedure may vary among health care providers and hospitals. ?What happens after the procedure? ?Your blood pressure, temperature, heart rate, breathing rate, and blood oxygen level will be monitored until the medicines you were given have worn off. ?You will wake up in a recovery area. You may wake up slowly. ?If you feel anxious or agitated, you may be given medicine to help you calm down. ?If you will be going home the same day, your health care provider may check to make sure you can walk, drink, and urinate. ?Your health care provider will treat any pain or side effects you have before you go home. ?Do not drive or operate machinery until your health care provider says that it is safe. ?Summary ?General anesthesia is used to keep you still and prevent pain during a procedure. ?It is important to tell your health care provider about  your medical history and any surgeries you have had, and previous experience with anesthesia. ?Follow your health care provider's instructions about when to stop eating, drinking, or taking certain medicines before your procedure. ?Plan to have a responsible adult take you home from the hospital or clinic. ?This information is not intended to replace advice given to you by your health care provider. Make sure you discuss any questions you have with your health care provider. ?Document Revised: 09/05/2019 Document Reviewed: 04/06/2019 ?[employer]vier Patient Education ? Media  Insertion ?Implanted port insertion is a procedure to put in a port and catheter. The port is a device with an injectable disc that can be accessed by your health care provider. The port is connected to a vein in the chest o

## 2021-05-09 NOTE — Progress Notes (Signed)
? ?[facility] ?[address] ?[place], [place] [address] ? ? ?CLINIC:  ?Medical Oncology/Hematology ? ?CONSULT NOTE ? ?Patient Care Team: ?Sharion Balloon, FNP as PCP - General (Family Medicine) ?Derek Jack, MD as Medical Oncologist (Medical Oncology) ?Brien Mates, RN as Oncology Nurse Navigator (Medical Oncology) ? 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 ?HISTORY OF PRESENTING ILLNESS:  ?[name] 51 y.o. female is here because of evaluation of colonic neoplasm, at the request of Dr. Arnoldo Morale. ? ?Today she reports feeling well, and she is accompanied by her husband. Prior to presenting to the ED for severe abdominal pain on 4/7 she reports she had abdominal pain for several weeks as well as possible hematochezia which she initially disregarded due to her history of IBS and internal hemorrhoids. She reports she had her first colonoscopy at 16 due to rectal bleeding. She denies recent unintentional weight loss. She denies tingling/numbness.  ? ?She lives at home with her husband. 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 She denies chemical exposure. She quit smoking 30 years ago. Her mother had breast cancer, her maternal aunt passed from colon cancer in her forties, another maternal aunt had pituitary cancer, her maternal first cousin passed from colon cancer in his thirties, her maternal uncle had stomach cancer, her paternal aunt passed from breast cancer, and her maternal grandfather had lung cancer. ? ?MEDICAL HISTORY:  ?Past Medical History:  ?Diagnosis Date  ? Hypertension   ? Lyme disease 2018  ? ? ?SURGICAL HISTORY: ?Past Surgical History:  ?Procedure Laterality Date  ? ABDOMINAL HYSTERECTOMY  2013  ? BIOPSY  04/13/2021  ? Procedure: BIOPSY;  Surgeon: [name], DO;  [facility];  Service: Endoscopy;;  descending colon mass  ? FLEXIBLE SIGMOIDOSCOPY N/A 04/13/2021  ? Procedure: FLEXIBLE  SIGMOIDOSCOPY;  Surgeon: [name], DO;  [facility]: [facility];  Service: Endoscopy;  Laterality: N/A;  ? PARTIAL COLECTOMY N/A 04/15/2021  ? Procedure: EXTENDED RIGHT HEMICOLECTOMY;  Surgeon: Aviva Signs, MD;  Location: [facility];  Service: General;  Laterality: N/A;  ? WISDOM TOOTH EXTRACTION    ? ? ?SOCIAL HISTORY: ?Social History  ? ?Socioeconomic History  ? Marital status: Married  ?  Spouse name: Not on file  ? Number of children: Not on file  ? Years of education: Not on file  ? Highest education level: Not on file  ?Occupational History  ? Not on file  ?Tobacco Use  ? Smoking status: Never  ? Smokeless tobacco: Never  ?Vaping Use  ? Vaping Use: Never used  ?Substance and Sexual Activity  ? Alcohol use: Yes  ?  Comment: occasionally  ? Drug use: No  ? Sexual activity: Yes  ?Other Topics Concern  ? Not on file  ?Social History Narrative  ? Not on file  ? ?Social Determinants of Health  ? ?Financial Resource Strain: Not on file  ?Food Insecurity: Not on file  ?Transportation Needs: Not on file  ?Physical Activity: Not on file  ?Stress: Not on file  ?Social Connections: Not on file  ?Intimate Partner Violence: Not on file  ? ? ?FAMILY HISTORY: ?Family History  ?Problem Relation Age of Onset  ? Kidney disease Mother   ? Hypertension Mother   ? Stroke Father   ? ? ?ALLERGIES:  ?is allergic to aleve [naproxen], codeine, and latex. ? ?MEDICATIONS:  ?Current Outpatient Medications  ?Medication Sig Dispense Refill  ? acetaminophen (TYLENOL)  500 MG tablet Take 1,000 mg by mouth every 6 (six) hours as needed for mild pain or headache.    ? amLODipine (NORVASC) 10 MG tablet Take 1 tablet (10 mg total) by mouth daily. 30 tablet 2  ? ASHWAGANDHA PO Take 1 tablet by mouth daily.    ? capecitabine (XELODA) 500 MG tablet Take 4 tablets (2,000 mg total) by mouth 2 (two) times daily after a meal. 14 days on, 7 days off 112 tablet 3  ? EPINEPHrine 0.3 mg/0.3 mL IJ SOAJ injection Inject 0.3 mg into the muscle as needed  for anaphylaxis. 1 each 2  ? FIBER PO Take 1 tablet by mouth daily.    ? ibuprofen (ADVIL) 200 MG tablet Take 400 mg by mouth every 6 (six) hours as needed for headache or mild pain.    ? Lactobacillus (ACIDOPHILUS PO) Take 1 capsule by mouth daily.    ? MAGNESIUM PO Take 1 tablet by mouth daily.    ? metoprolol tartrate (LOPRESSOR) 50 MG tablet Take 1 tablet (50 mg total) by mouth 2 (two) times daily. 60 tablet 2  ? Multiple Vitamins-Minerals (ZINC PO) Take 1 tablet by mouth daily.    ? VITAMIN D PO Take 1 tablet by mouth daily.    ? [START ON 05/20/2021] OXALIPLATIN IV Inject into the vein every 21 ( twenty-one) days.    ? ?No current facility-administered medications for this visit.  ? ? ?REVIEW OF SYSTEMS:   ?Review of Systems  ?Constitutional:  Negative for appetite change, fatigue and unexpected weight change.  ?Gastrointestinal:  Positive for diarrhea.  ?Neurological:  Positive for dizziness. Negative for numbness.  ?Psychiatric/Behavioral:  Positive for sleep disturbance.   ?All other systems reviewed and are negative. ? ? ?PHYSICAL EXAMINATION: ?ECOG PERFORMANCE STATUS: 0 - Asymptomatic ? ?Vitals:  ? 05/09/21 0827  ?BP: (!) 135/101  ?Pulse: 70  ?Resp: 18  ?Temp: 98.5 ?F (36.9 ?C)  ?SpO2: 100%  ? ?Filed Weights  ? 05/09/21 0827  ?Weight: 214 lb 15.2 oz (97.5 kg)  ? ?Physical Exam ?Vitals reviewed.  ?Constitutional:   ?   Appearance: Normal appearance. She is obese.  ?Cardiovascular:  ?   Rate and Rhythm: Normal rate and regular rhythm.  ?   Pulses: Normal pulses.  ?   Heart sounds: Normal heart sounds.  ?Pulmonary:  ?   Effort: Pulmonary effort is normal.  ?   Breath sounds: Normal breath sounds.  ?Abdominal:  ?   General: A surgical scar is present.  ?   Palpations: Abdomen is soft. There is no hepatomegaly, splenomegaly or mass.  ?   Tenderness: There is no abdominal tenderness.  ?   Comments: Suprapubic surgical scar  ?Musculoskeletal:  ?   Right lower leg: No edema.  ?   Left lower leg: No edema.   ?Neurological:  ?   General: No focal deficit present.  ?   Mental Status: She is alert and oriented to person, place, and time.  ?Psychiatric:     ?   Mood and Affect: Mood normal.     ?   Behavior: Behavior normal.  ? ? ? ?LABORATORY DATA:  ?I have reviewed the data as listed ? ?  Latest Ref Rng & Units 05/09/2021  ? 10:08 AM 04/16/2021  ?  5:44 AM 04/15/2021  ?  6:39 AM  ?CBC  ?WBC 4.0 - 10.5 K/uL 7.0   12.0   12.5    ?Hemoglobin 12.0 - 15.0 g/dL 11.6  12.1   13.3    ?Hematocrit 36.0 - 46.0 % 36.4   36.6   40.0    ?Platelets 150 - 400 K/uL 281   342   323    ? ? ?  Latest Ref Rng & Units 05/09/2021  ? 10:08 AM 04/16/2021  ?  5:44 AM 04/15/2021  ?  6:39 AM  ?CMP  ?Glucose 70 - 99 mg/dL 102   135   138    ?BUN 6 - 20 mg/dL '9   16   17    '$ ?Creatinine 0.44 - 1.00 mg/dL 0.77   0.73   0.66    ?Sodium 135 - 145 mmol/L 137   141   139    ?Potassium 3.5 - 5.1 mmol/L 4.7   3.5   3.2    ?Chloride 98 - 111 mmol/L 106   108   108    ?CO2 22 - 32 mmol/L '26   26   23    '$ ?Calcium 8.9 - 10.3 mg/dL 8.9   8.1   8.2    ?Total Protein 6.5 - 8.1 g/dL 6.9      ?Total Bilirubin 0.3 - 1.2 mg/dL 0.5      ?Alkaline Phos 38 - 126 U/L 68      ?AST 15 - 41 U/L 15      ?ALT 0 - 44 U/L 15      ? ? ?RADIOGRAPHIC STUDIES: ?I have personally reviewed the radiological images as listed and agreed with the findings in the report. ?CT Angio Chest PE W and/or Wo Contrast ? ?Result Date: 04/12/2021 ?CLINICAL DATA:  Sob, chest pain, general abd pain/cramping x 2 weeks, worse in last couple days. Elevated d-dimer. EXAM: CT ANGIOGRAPHY CHEST CT ABDOMEN AND PELVIS WITH CONTRAST TECHNIQUE: Multidetector CT imaging of the chest was performed using the standard protocol during bolus administration of intravenous contrast. Multiplanar CT image reconstructions and MIPs were obtained to evaluate the vascular anatomy. Multidetector CT imaging of the abdomen and pelvis was performed using the standard protocol during bolus administration of intravenous contrast. RADIATION  DOSE REDUCTION: This exam was performed according to the departmental dose-optimization program which includes automated exposure control, adjustment of the mA and/or kV according to patient size and/or use of iterative rec

## 2021-05-09 NOTE — Progress Notes (Signed)
START ON PATHWAY REGIMEN - Colorectal     A cycle is every 21 days:     Capecitabine      Oxaliplatin   **Always confirm dose/schedule in your pharmacy ordering system**  Patient Characteristics: Postoperative without Neoadjuvant Therapy, M0 (Pathologic Staging), Colon, Stage III, Low Risk (pT1-3, pN1) Tumor Location: Colon Therapeutic Status: Postoperative without Neoadjuvant Therapy, M0 (Pathologic Staging) AJCC M Category: cM0 AJCC T Category: pT3 AJCC N Category: pN1b AJCC 8 Stage Grouping: IIIB Intent of Therapy: Curative Intent, Discussed with Patient 

## 2021-05-09 NOTE — Patient Instructions (Addendum)
[place] at [facility] ?Discharge Instructions ? ?You were seen and examined today by Dr. Delton Coombes. Dr. Delton Coombes is a medical oncologist, meaning that he specializes in the treatment of cancer diagnoses. Dr. Delton Coombes discussed your past medical history, family history of cancers, and the events that led to you being here today. ? ?You were referred to Dr. Delton Coombes by Dr. Arnoldo Morale due to a new diagnosis of colon cancer. It does appear to be completely removed during surgery. On the pathology report, there were 2 out of the 35 tested lymph nodes involved with the cancer. Because of the lymph node involvement, you have been diagnosed with Stage III Colon Cancer. ? ?Because the cancer had spread to the lymph node, it is recommended that you undergo chemotherapy treatment to prevent the cancer from returning. Chemotherapy is a combination of pills and IV medication. The treatment is known as CAPEOX. ? ?Prior to treatment, you will have a Port-A-Cath placed by Dr. Arnoldo Morale. This is the safest way to administer chemotherapy. ? ?After completion of treatment, you will be followed very closely with appointments and labs every 3 months and CT scans every 6 months for the first two years. For the following 3 years, you will have labs and appointments every 6 months and a CT scan every 12 months. You will be closely monitored for a total of 5 years. ? ?Once you completely chemotherapy, you will also need a colonoscopy because the GI doctor could not visualize the entire colon due to the cancer being in his way. One year late, you will have another colon cancer. ? ?Due to your family history, you will also be referred for genetic counseling. ? ?Follow-up as scheduled. ? ? ?Thank you for choosing [place] at [facility] to provide your oncology and hematology care.  To afford each patient quality time with our provider, please arrive at least 15 minutes before your  scheduled appointment time.  ? ?If you have a lab appointment with the [facility] please come in thru the Main Entrance and check in at the main information desk. ? ?You need to re-schedule your appointment should you arrive 10 or more minutes late.  We strive to give you quality time with our providers, and arriving late affects you and other patients whose appointments are after yours.  Also, if you no show three or more times for appointments you may be dismissed from the clinic at the providers discretion.     ?Again, thank you for choosing [facility].  Our hope is that these requests will decrease the amount of time that you wait before being seen by our physicians.       ?_____________________________________________________________ ? ?Should you have questions after your visit to [facility], please contact our office at [phone] and follow the prompts.  Our office hours are 8:00 a.m. and 4:30 p.m. Monday - Friday.  Please note that voicemails left after 4:00 p.m. may not be returned until the following business day.  We are closed weekends and major holidays.  You do have access to a nurse 24-7, just call the main number to the clinic [phone] and do not press any options, hold on the line and a nurse will answer the phone.   ? ?For prescription refill requests, have your pharmacy contact our office and allow 72 hours.   ? ?Due to Covid, you will need to wear a mask upon entering the hospital.  If you do not have a mask, a mask will be given to you at the Main Entrance upon arrival. For doctor visits, patients may have 1 support person age 6 or older with them. For treatment visits, patients can not have anyone with them due to social distancing guidelines and our immunocompromised population.  ? ? ? ?

## 2021-05-10 ENCOUNTER — Encounter (HOSPITAL_COMMUNITY)
Admission: RE | Admit: 2021-05-10 | Discharge: 2021-05-10 | Disposition: A | Discharge status: Home / Self Care | Payer: Self-pay | Source: Ambulatory Visit | Attending: General Surgery | Admitting: General Surgery

## 2021-05-10 ENCOUNTER — Encounter (HOSPITAL_COMMUNITY): Payer: Self-pay

## 2021-05-10 ENCOUNTER — Telehealth (HOSPITAL_COMMUNITY): Payer: Self-pay | Admitting: Pharmacy Technician

## 2021-05-10 HISTORY — DX: Malignant (primary) neoplasm, unspecified: C80.1

## 2021-05-10 NOTE — H&P (Signed)
Alison Ramsey is an 51 y.o. female.  ?Chief Complaint: Colon carcinoma, need for central venous access ?HPI: Patient is a 51 year old white female status post an extended right hemicolectomy for a near obstructing colon mass that ultimately was found to be colon cancer.  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  She is now referred for Port-A-Cath placement for chemotherapy. ? ?Past Medical History:  ?Diagnosis Date  ? Hypertension   ? Lyme disease 2018  ? ? ?Past Surgical History:  ?Procedure Laterality Date  ? ABDOMINAL HYSTERECTOMY  2013  ? BIOPSY  04/13/2021  ? Procedure: BIOPSY;  Surgeon: [name], DO;  [facility];  Service: Endoscopy;;  descending colon mass  ? FLEXIBLE SIGMOIDOSCOPY N/A 04/13/2021  ? Procedure: FLEXIBLE SIGMOIDOSCOPY;  Surgeon: [name], DO;  [facility];  Service: Endoscopy;  Laterality: N/A;  ? PARTIAL COLECTOMY N/A 04/15/2021  ? Procedure: EXTENDED RIGHT HEMICOLECTOMY;  Surgeon: Aviva Signs, MD;  Location: [facility];  Service: General;  Laterality: N/A;  ? WISDOM TOOTH EXTRACTION    ? ? ?Family History  ?Problem Relation Age of Onset  ? Kidney disease Mother   ? Hypertension Mother   ? Stroke Father   ? ?Social History:  reports that she has never smoked. She has never used smokeless tobacco. She reports current alcohol use. She reports that she does not use drugs. ? ?Allergies:  ?Allergies  ?Allergen Reactions  ? Aleve [Naproxen] Hives  ?  Ok with ibuprofen  ? Codeine Hives, Nausea And Vomiting and Rash  ? Latex Rash  ? ? ?No medications prior to admission.  ? ? ?Results for orders placed or performed in visit on 05/09/21 (from the past 48 hour(s))  ?CBC with Differential     Status: Abnormal  ? Collection Time: 05/09/21 10:08 AM  ?Result Value Ref Range  ? WBC 7.0 4.0 - 10.5 K/uL  ? RBC 4.33 3.87 - 5.11 MIL/uL  ? Hemoglobin 11.6 (L) 12.0 - 15.0 g/dL  ? HCT 36.4 36.0 - 46.0 %  ? MCV 84.1 80.0 - 100.0 fL  ? MCH 26.8  26.0 - 34.0 pg  ? MCHC 31.9 30.0 - 36.0 g/dL  ? RDW 14.4 11.5 - 15.5 %  ? Platelets 281 150 - 400 K/uL  ? nRBC 0.0 0.0 - 0.2 %  ? Neutrophils Relative % 69 %  ? Neutro Abs 4.7 1.7 - 7.7 K/uL  ? Lymphocytes Relative 22 %  ? Lymphs Abs 1.6 0.7 - 4.0 K/uL  ? Monocytes Relative 5 %  ? Monocytes Absolute 0.4 0.1 - 1.0 K/uL  ? Eosinophils Relative 4 %  ? Eosinophils Absolute 0.3 0.0 - 0.5 K/uL  ? Basophils Relative 0 %  ? Basophils Absolute 0.0 0.0 - 0.1 K/uL  ? Immature Granulocytes 0 %  ? Abs Immature Granulocytes 0.03 0.00 - 0.07 K/uL  ?  Comment: Performed at [facility], [address] [place], [place] [address]  ?Comprehensive metabolic panel     Status: Abnormal  ? Collection Time: 05/09/21 10:08 AM  ?Result Value Ref Range  ? Sodium 137 135 - 145 mmol/L  ? Potassium 4.7 3.5 - 5.1 mmol/L  ? Chloride 106 98 - 111 mmol/L  ? CO2 26 22 - 32 mmol/L  ? Glucose, Bld 102 (H) 70 - 99 mg/dL  ?  Comment: Glucose reference range applies only to samples taken after fasting for at least 8 hours.  ? BUN  9 6 - 20 mg/dL  ? Creatinine, Ser 0.77 0.44 - 1.00 mg/dL  ? Calcium 8.9 8.9 - 10.3 mg/dL  ? Total Protein 6.9 6.5 - 8.1 g/dL  ? Albumin 3.7 3.5 - 5.0 g/dL  ? AST 15 15 - 41 U/L  ? ALT 15 0 - 44 U/L  ? Alkaline Phosphatase 68 38 - 126 U/L  ? Total Bilirubin 0.5 0.3 - 1.2 mg/dL  ? GFR, Estimated >60 >60 mL/min  ?  Comment: (NOTE) ?Calculated using the CKD-EPI Creatinine Equation (2021) ?  ? Anion gap 5 5 - 15  ?  Comment: Performed at [facility], [address] [place], [place] [address]  ? ?No results found. ? ?Review of Systems  ?Constitutional: Negative.   ?HENT: Negative.    ?Eyes: Negative.   ?Respiratory: Negative.    ?Cardiovascular: Negative.   ?Gastrointestinal: Negative.   ?Endocrine: Negative.   ?Genitourinary: Negative.   ?Musculoskeletal: Negative.   ?Allergic/Immunologic: Negative.   ?Neurological: Negative.   ?Hematological: Negative.   ?Psychiatric/Behavioral: Negative.    ? ?There were no vitals taken for this  visit. ?Physical Exam ?Vitals reviewed.  ?Constitutional:   ?   Appearance: Normal appearance. She is not ill-appearing.  ?HENT:  ?   Head: Normocephalic and atraumatic.  ?Cardiovascular:  ?   Rate and Rhythm: Normal rate and regular rhythm.  ?   Heart sounds: Normal heart sounds. No murmur heard. ?  No friction rub. No gallop.  ?Pulmonary:  ?   Effort: Pulmonary effort is normal. No respiratory distress.  ?   Breath sounds: Normal breath sounds. No stridor. No wheezing, rhonchi or rales.  ?Skin: ?   General: Skin is warm and dry.  ?Neurological:  ?   Mental Status: She is alert and oriented to person, place, and time.  ?  ?Dr. Tomie China notes reviewed ?Assessment/Plan ?Impression: Colon carcinoma, need for central venous access ?Plan: Patient will undergo a Port-A-Cath insertion on 05/13/2021.  The risks and benefits of the procedure including bleeding, infection, and pneumothorax were fully explained to the patient, who gave informed consent. ? ?Aviva Signs, MD ?05/10/2021, 12:56 PM ? ? ? ?

## 2021-05-10 NOTE — Telephone Encounter (Signed)
Oral Oncology Patient Advocate Encounter ? ?Patient is currently uninsured. Checking pharmacies to find lowest price for patient. ? ?[facility] $75 per cycle ? ?[facility] ~ $45 plus $5 shipping. Pt must have email and online access to order. ? ?Dennison Nancy CPHT ?Specialty Pharmacy Patient Advocate ?[place] ?Phone [phone] ?Fax [phone] ?05/10/2021 9:32 AM ? ?

## 2021-05-13 ENCOUNTER — Other Ambulatory Visit (HOSPITAL_COMMUNITY): Payer: Self-pay

## 2021-05-13 ENCOUNTER — Ambulatory Visit (HOSPITAL_COMMUNITY): Payer: Self-pay | Admitting: Certified Registered"

## 2021-05-13 ENCOUNTER — Ambulatory Visit (HOSPITAL_COMMUNITY): Payer: Self-pay

## 2021-05-13 ENCOUNTER — Other Ambulatory Visit: Payer: Self-pay

## 2021-05-13 ENCOUNTER — Ambulatory Visit (HOSPITAL_BASED_OUTPATIENT_CLINIC_OR_DEPARTMENT_OTHER): Payer: Self-pay | Admitting: Certified Registered"

## 2021-05-13 ENCOUNTER — Encounter (HOSPITAL_COMMUNITY)
Admission: RE | Disposition: A | Discharge status: Home / Self Care | Payer: Self-pay | Source: Ambulatory Visit | Attending: General Surgery

## 2021-05-13 ENCOUNTER — Ambulatory Visit (HOSPITAL_COMMUNITY)
Admission: RE | Admit: 2021-05-13 | Discharge: 2021-05-13 | Disposition: A | Discharge status: Home / Self Care | Payer: Self-pay | Source: Ambulatory Visit | Attending: General Surgery | Admitting: General Surgery

## 2021-05-13 ENCOUNTER — Encounter (HOSPITAL_COMMUNITY): Payer: Self-pay | Admitting: General Surgery

## 2021-05-13 ENCOUNTER — Telehealth (HOSPITAL_COMMUNITY): Payer: Self-pay | Admitting: Pharmacist

## 2021-05-13 DIAGNOSIS — C189 Malignant neoplasm of colon, unspecified: Secondary | ICD-10-CM

## 2021-05-13 DIAGNOSIS — C187 Malignant neoplasm of sigmoid colon: Secondary | ICD-10-CM | POA: Insufficient documentation

## 2021-05-13 DIAGNOSIS — I10 Essential (primary) hypertension: Secondary | ICD-10-CM | POA: Insufficient documentation

## 2021-05-13 DIAGNOSIS — Z452 Encounter for adjustment and management of vascular access device: Secondary | ICD-10-CM | POA: Insufficient documentation

## 2021-05-13 DIAGNOSIS — C182 Malignant neoplasm of ascending colon: Secondary | ICD-10-CM

## 2021-05-13 HISTORY — PX: PORTACATH PLACEMENT: SHX2246

## 2021-05-13 SURGERY — INSERTION, TUNNELED CENTRAL VENOUS DEVICE, WITH PORT
Anesthesia: General | Site: Chest | Laterality: Left

## 2021-05-13 MED ORDER — MIDAZOLAM HCL 2 MG/2ML IJ SOLN
INTRAMUSCULAR | Status: AC
  Filled 2021-05-13: qty 2

## 2021-05-13 MED ORDER — FENTANYL CITRATE (PF) 100 MCG/2ML IJ SOLN
INTRAMUSCULAR | Status: DC | PRN
Start: 2021-05-13 — End: 2021-05-13
  Administered 2021-05-13 (×2): 50 ug via INTRAVENOUS

## 2021-05-13 MED ORDER — LIDOCAINE HCL (PF) 1 % IJ SOLN
INTRAMUSCULAR | Status: DC | PRN
  Administered 2021-05-13: 7 mL

## 2021-05-13 MED ORDER — LIDOCAINE HCL (PF) 1 % IJ SOLN
INTRAMUSCULAR | Status: AC
  Filled 2021-05-13: qty 30

## 2021-05-13 MED ORDER — HEPARIN SOD (PORK) LOCK FLUSH 100 UNIT/ML IV SOLN
INTRAVENOUS | Status: AC
  Filled 2021-05-13: qty 5

## 2021-05-13 MED ORDER — PROPOFOL 10 MG/ML IV BOLUS
INTRAVENOUS | Status: AC
  Filled 2021-05-13: qty 20

## 2021-05-13 MED ORDER — HEPARIN SOD (PORK) LOCK FLUSH 100 UNIT/ML IV SOLN
INTRAVENOUS | Status: DC | PRN
  Administered 2021-05-13: 500 [IU] via INTRAVENOUS

## 2021-05-13 MED ORDER — CAPECITABINE 500 MG PO TABS
2000.0000 mg | ORAL_TABLET | Freq: Two times a day (BID) | ORAL | 3 refills | Status: DC
  Filled 2021-05-13: qty 112, 14d supply, fill #0

## 2021-05-13 MED ORDER — LACTATED RINGERS IV SOLN
INTRAVENOUS | Status: DC | PRN
Start: 2021-05-13 — End: 2021-05-13

## 2021-05-13 MED ORDER — CHLORHEXIDINE GLUCONATE CLOTH 2 % EX PADS: 6.0000 | MEDICATED_PAD | Freq: Once | CUTANEOUS | Status: DC

## 2021-05-13 MED ORDER — SODIUM CHLORIDE (PF) 0.9 % IJ SOLN
INTRAMUSCULAR | Status: DC | PRN
  Administered 2021-05-13: 500 mL via INTRAVENOUS

## 2021-05-13 MED ORDER — CEFAZOLIN SODIUM-DEXTROSE 2-4 GM/100ML-% IV SOLN
2.0000 g | INTRAVENOUS | Status: AC
  Administered 2021-05-13: 2 g via INTRAVENOUS

## 2021-05-13 MED ORDER — LIDOCAINE 2% (20 MG/ML) 5 ML SYRINGE
INTRAMUSCULAR | Status: DC | PRN
  Administered 2021-05-13: 60 mg via INTRAVENOUS

## 2021-05-13 MED ORDER — CEFAZOLIN SODIUM-DEXTROSE 2-4 GM/100ML-% IV SOLN
INTRAVENOUS | Status: AC
Start: 2021-05-13 — End: 2021-05-13
  Filled 2021-05-13: qty 100

## 2021-05-13 MED ORDER — MIDAZOLAM HCL 5 MG/5ML IJ SOLN
INTRAMUSCULAR | Status: DC | PRN
  Administered 2021-05-13: 2 mg via INTRAVENOUS

## 2021-05-13 MED ORDER — ONDANSETRON HCL 4 MG/2ML IJ SOLN: 4.0000 mg | Freq: Once | INTRAMUSCULAR | Status: DC | PRN

## 2021-05-13 MED ORDER — FENTANYL CITRATE PF 50 MCG/ML IJ SOSY: 25.0000 ug | PREFILLED_SYRINGE | INTRAMUSCULAR | Status: DC | PRN

## 2021-05-13 MED ORDER — PROPOFOL 500 MG/50ML IV EMUL
INTRAVENOUS | Status: DC | PRN
  Administered 2021-05-13: 100 ug/kg/min via INTRAVENOUS

## 2021-05-13 MED ORDER — FENTANYL CITRATE (PF) 100 MCG/2ML IJ SOLN
INTRAMUSCULAR | Status: AC
  Filled 2021-05-13: qty 2

## 2021-05-13 SURGICAL SUPPLY — 31 items
ADH SKN CLS APL DERMABOND .7 (GAUZE/BANDAGES/DRESSINGS) ×1
APL PRP STRL LF ISPRP CHG 10.5 (MISCELLANEOUS) ×1
APPLICATOR CHLORAPREP 10.5 ORG (MISCELLANEOUS) ×2 IMPLANT
BAG DECANTER FOR FLEXI CONT (MISCELLANEOUS) ×2 IMPLANT
CLOTH BEACON ORANGE TIMEOUT ST (SAFETY) ×2 IMPLANT
COVER LIGHT HANDLE STERIS (MISCELLANEOUS) ×4 IMPLANT
DECANTER SPIKE VIAL GLASS SM (MISCELLANEOUS) ×2 IMPLANT
DERMABOND ADVANCED (GAUZE/BANDAGES/DRESSINGS) ×1
DERMABOND ADVANCED .7 DNX12 (GAUZE/BANDAGES/DRESSINGS) ×1 IMPLANT
DRAPE C-ARM FOLDED MOBILE STRL (DRAPES) ×2 IMPLANT
ELECT REM PT RETURN 9FT ADLT (ELECTROSURGICAL) ×2
ELECTRODE REM PT RTRN 9FT ADLT (ELECTROSURGICAL) ×1 IMPLANT
GLOVE BIOGEL PI IND STRL 7.0 (GLOVE) ×2 IMPLANT
GLOVE BIOGEL PI INDICATOR 7.0 (GLOVE) ×2
GLOVE SURG SS PI 7.0 STRL IVOR (GLOVE) ×1 IMPLANT
GLOVE SURG SS PI 7.5 STRL IVOR (GLOVE) ×2 IMPLANT
GOWN STRL REUS W/TWL LRG LVL3 (GOWN DISPOSABLE) ×4 IMPLANT
IV NS 500ML (IV SOLUTION) ×2
IV NS 500ML BAXH (IV SOLUTION) ×1 IMPLANT
KIT PORT POWER 8FR ISP MRI (Port) ×2 IMPLANT
KIT TURNOVER KIT A (KITS) ×2 IMPLANT
NDL HYPO 25X1 1.5 SAFETY (NEEDLE) ×1 IMPLANT
NEEDLE HYPO 25X1 1.5 SAFETY (NEEDLE) ×2 IMPLANT
PACK MINOR (CUSTOM PROCEDURE TRAY) ×2 IMPLANT
PAD ARMBOARD 7.5X6 YLW CONV (MISCELLANEOUS) ×2 IMPLANT
SET BASIN LINEN APH (SET/KITS/TRAYS/PACK) ×2 IMPLANT
SUT MNCRL AB 4-0 PS2 18 (SUTURE) ×2 IMPLANT
SUT VIC AB 3-0 SH 27 (SUTURE) ×2
SUT VIC AB 3-0 SH 27X BRD (SUTURE) ×1 IMPLANT
SYR 5ML LL (SYRINGE) ×2 IMPLANT
SYR CONTROL 10ML LL (SYRINGE) ×2 IMPLANT

## 2021-05-13 NOTE — Anesthesia Preprocedure Evaluation (Signed)
Anesthesia Evaluation  ?Patient identified by MRN, date of birth, ID band ?Patient awake ? ? ? ?Reviewed: ?Allergy & Precautions, H&P , NPO status , Patient's Chart, lab work & pertinent test results, reviewed documented beta blocker date and time  ? ?Airway ?Mallampati: II ? ?TM Distance: >3 FB ?Neck ROM: full ? ? ? Dental ?no notable dental hx. ? ?  ?Pulmonary ?neg pulmonary ROS,  ?  ?Pulmonary exam normal ?breath sounds clear to auscultation ? ? ? ? ? ? Cardiovascular ?Exercise Tolerance: Good ?hypertension, negative cardio ROS ? ? ?Rhythm:regular Rate:Normal ? ? ?  ?Neuro/Psych ?negative neurological ROS ? negative psych ROS  ? GI/Hepatic ?negative GI ROS, Neg liver ROS,   ?Endo/Other  ?negative endocrine ROS ? Renal/GU ?negative Renal ROS  ?negative genitourinary ?  ?Musculoskeletal ? ? Abdominal ?  ?Peds ? Hematology ?negative hematology ROS ?(+)   ?Anesthesia Other Findings ? ? Reproductive/Obstetrics ?negative OB ROS ? ?  ? ? ? ? ? ? ? ? ? ? ? ? ? ?  ?  ? ? ? ? ? ? ? ? ?Anesthesia Physical ?Anesthesia Plan ? ?ASA: 3 ? ?Anesthesia Plan: General  ? ?Post-op Pain Management:   ? ?Induction:  ? ?PONV Risk Score and Plan: Propofol infusion ? ?Airway Management Planned:  ? ?Additional Equipment:  ? ?Intra-op Plan:  ? ?Post-operative Plan:  ? ?Informed Consent: I have reviewed the patients History and Physical, chart, labs and discussed the procedure including the risks, benefits and alternatives for the proposed anesthesia with the patient or authorized representative who has indicated his/her understanding and acceptance.  ? ? ? ?Dental Advisory Given ? ?Plan Discussed with: CRNA ? ?Anesthesia Plan Comments:   ? ? ? ? ? ? ?Anesthesia Quick Evaluation ? ?

## 2021-05-13 NOTE — Op Note (Signed)
Patient:  Alison Ramsey ? ?DOB:  February 26, 1970 ? ?MRN:  [id number] ? ? ?Preop Diagnosis: Colon carcinoma, need for central venous access ? ?Postop Diagnosis: Same ? ?Procedure: Port-A-Cath insertion ? ?Surgeon: Aviva Signs, MD ? ?Anes: MAC ? ?Indications: Patient is a 51 year old white female recently diagnosed with colon cancer who is about to undergo chemotherapy and needs central venous access.  The risks and benefits of the procedure including bleeding, infection, and pneumothorax were fully explained to the patient, who gave informed consent. ? ?Procedure note: The patient was placed in the Trendelenburg position after monitored anesthesia care was given.  The left upper chest was prepped and draped using the usual sterile technique with ChloraPrep.  Surgical site confirmation was performed.  1% Xylocaine was used for local anesthesia. ? ?An incision was made below the left clavicle.  A subcutaneous pocket was formed.  A needle was advanced into the left subclavian vein using the Seldinger technique without difficulty.  A guidewire was then advanced into the right atrium under fluoroscopic guidance.  An introducer and peel-away sheath were placed over the guidewire.  The catheter was inserted through the peel-away sheath and the peel-away sheath was removed.  The catheter was then attached to the port and the port placed in subcutaneous pocket.  Adequate positioning was confirmed by fluoroscopy.  Good backflow of venous blood was noted on aspiration of the port.  The port was flushed with heparin flush.  The subcutaneous layer was reapproximated using 3-0 Vicryl interrupted suture.  The skin was closed using a 4-0 Monocryl subcuticular suture.  Dermabond was applied. ? ?All tape and needle counts were correct at the end of the procedure.  The patient was awakened and transferred to PACU in stable condition.  A chest x-ray will be performed at that time. ? ?Complications: None ? ?EBL: Minimal ? ?Specimen:  None ? ? ?  ?

## 2021-05-13 NOTE — Telephone Encounter (Signed)
Oral Oncology Pharmacist Encounter ? ?Received new prescription for Xeloda (capecitabine) for the treatment of stage III colon cancer in conjunction with oxaliplatin, planned duration of 3 months. ? ?CMP from 05/09/21 assessed, no relevant lab abnormalities. Prescription dose and frequency assessed.  ? ?Current medication list in Epic reviewed, no DDIs with capecitabine identified: ? ?Evaluated chart and no patient barriers to medication adherence identified.  ? ?Prescription has been [facility]-scribed to the [facility]. ? ?Oral Oncology Clinic will continue to follow for cost issues, initial counseling, and start date. ? ? ?Darl Pikes, PharmD, BCPS, BCOP, CPP ?Hematology/Oncology Clinical Pharmacist Practitioner ?[facility] ?[phone] ? ?05/13/2021 12:09 PM ? ?

## 2021-05-13 NOTE — Anesthesia Postprocedure Evaluation (Signed)
Anesthesia Post Note ? ?Patient: EVALINA TABAK ? ?Procedure(s) Performed: INSERTION PORT-A-CATH (Left: Chest) ? ?Patient location during evaluation: Phase II ?Anesthesia Type: General ?Level of consciousness: awake ?Pain management: pain level controlled ?Vital Signs Assessment: post-procedure vital signs reviewed and stable ?Respiratory status: spontaneous breathing and respiratory function stable ?Cardiovascular status: blood pressure returned to baseline and stable ?Postop Assessment: no headache and no apparent nausea or vomiting ?Anesthetic complications: no ?Comments: Late entry ? ? ?No notable events documented. ? ? ?Last Vitals:  ?Vitals:  ? 05/13/21 0815 05/13/21 0827  ?BP: 101/65 117/83  ?Pulse: 65 63  ?Resp: (!) 9 10  ?Temp:    ?SpO2: 95% 99%  ?  ?Last Pain:  ?Vitals:  ? 05/13/21 0827  ?PainSc: 0-No pain  ? ? ?  ?  ?  ?  ?  ?  ? ?Louann Sjogren ? ? ? ? ?

## 2021-05-13 NOTE — Transfer of Care (Signed)
Immediate Anesthesia Transfer of Care Note ? ?Patient: Alison Ramsey ? ?Procedure(s) Performed: INSERTION PORT-A-CATH (Left: Chest) ? ?Patient Location: PACU ? ?Anesthesia Type:MAC ? ?Level of Consciousness: sedated, patient cooperative and responds to stimulation ? ?Airway & Oxygen Therapy: Patient Spontanous Breathing and Patient connected to nasal cannula oxygen ? ?Post-op Assessment: Report given to RN, Post -op Vital signs reviewed and stable and Patient moving all extremities ? ?Post vital signs: Reviewed and stable ? ?Last Vitals:  ?Vitals Value Taken Time  ?BP 92/68 05/13/21 0805  ?Temp    ?Pulse 70 05/13/21 0807  ?Resp 13 05/13/21 0807  ?SpO2 94 % 05/13/21 0807  ?Vitals shown include unvalidated device data. ? ?Last Pain:  ?Vitals:  ? 05/13/21 0704  ?PainSc: 0-No pain  ?   ? ?  ? ?Complications: No notable events documented. ?

## 2021-05-13 NOTE — Interval H&P Note (Signed)
History and Physical Interval Note: ? ?05/13/2021 ?7:20 AM ? ?Alison Ramsey  has presented today for surgery, with the diagnosis of Colon cancer.  The various methods of treatment have been discussed with the patient and family. After consideration of risks, benefits and other options for treatment, the patient has consented to  Procedure(s) with comments: ?INSERTION PORT-A-CATH (Left) - Pt notified to arrive at 6:15am K[facility] as a surgical intervention.  The patient's history has been reviewed, patient examined, no change in status, stable for surgery.  I have reviewed the patient's chart and labs.  Questions were answered to the patient's satisfaction.   ? ? ?Aviva Signs ? ? ?

## 2021-05-14 ENCOUNTER — Encounter (HOSPITAL_COMMUNITY): Payer: Self-pay | Admitting: General Surgery

## 2021-05-15 ENCOUNTER — Telehealth (HOSPITAL_COMMUNITY): Payer: Self-pay | Admitting: Hematology

## 2021-05-15 ENCOUNTER — Inpatient Hospital Stay (HOSPITAL_COMMUNITY): Payer: Self-pay | Admitting: Licensed Clinical Social Worker

## 2021-05-15 ENCOUNTER — Other Ambulatory Visit (HOSPITAL_COMMUNITY): Payer: Self-pay

## 2021-05-15 DIAGNOSIS — C182 Malignant neoplasm of ascending colon: Secondary | ICD-10-CM

## 2021-05-15 MED ORDER — CAPECITABINE 500 MG PO TABS
2000.0000 mg | ORAL_TABLET | Freq: Two times a day (BID) | ORAL | 3 refills | Status: DC
  Filled 2021-05-15: qty 112, 21d supply, fill #0
  Filled 2021-05-16: qty 112, 14d supply, fill #0

## 2021-05-15 NOTE — Telephone Encounter (Signed)
Spoke to pt to see if she qualifies for the [facility] found. Unfortunately, she and her husband combined do not meet the qualifications. ?   ?

## 2021-05-15 NOTE — Telephone Encounter (Signed)
Oral Chemotherapy Pharmacist Encounter ? ?Ms. Strickler knows the plan is to start her capecitabine on 05/20/21 along with her infusion. ? ?Patient Education ?I spoke with patient for overview of new oral chemotherapy medication: Xeloda (capecitabine) for the treatment of stage III colon cancer in conjunction with oxaliplatin, planned duration of 3 months.  ? ?Pt is doing well. Counseled patient on administration, dosing, side effects, monitoring, drug-food interactions, safe handling, storage, and disposal. ?Patient will take 4 tablets (2,000 mg total) by mouth 2 (two) times daily after a meal. Take for 14 days, then hold for 7 days. Repeat every 21 days. ? ?Side effects include but not limited to: diarrhea, hand-foot syndrome, mouth sores, edema, decreased wbc, fatigue, N/V ?Diarrhea: Suggested that she pick up loperamide to have on hand if needed. She knows to call the office if she is having 4 or more loose stools per day ?Hand-foot syndrome: Recommended the use of Udderly Smooth Extra Care 20 ?Mouth sores: Instructed the patient to call for magic mouthwash if needed ? ?Reviewed with patient importance of keeping a medication schedule and plan for any missed doses. ? ?After discussion with patient no patient barriers to medication adherence identified.  ? ?Ms. Bui voiced understanding and appreciation. All questions answered. Medication handout provided. ? ?Provided patient with [facility] phone number. Patient knows to call the office with questions or concerns. Oral [facility]otherapy [facility] will continue to follow. ? ?Darl Pikes, PharmD, BCPS, BCOP, CPP ?Hematology/Oncology Clinical Pharmacist Practitioner ?[place] ?[phone] ? ?05/15/2021 9:40 AM  ?

## 2021-05-15 NOTE — Progress Notes (Signed)
[facility] Clinical Social Work  ?Initial Assessment ? ? ?Alison Ramsey is a 51 y.o. year old female accompanied by patient and spouse. Clinical Social Work was referred by medical provider for assessment of psychosocial needs.  ? ?SDOH (Social Determinants of Health) assessments performed: Yes ?SDOH Interventions   ? ?Flowsheet Row Most Recent Value  ?SDOH Interventions   ?Financial Strain Interventions Development worker, community  ? ?  ?  ?Distress Screen completed: No ?   ? View : No data to display.  ?  ?  ?  ? ? ? ? ?Family/Social Information:  ?Housing Arrangement: Pt resides w/ spouse and 2 adult children (ages 58 and 50), the couple also has 2 additional adult children who reside nearby ?Family members/support persons in your life? Family ?Transportation concerns: no  ?Employment: Working full time. Income source: Employment ?Financial concerns:  Pt does not have any immediate financial concerns; however, she recently changed positions at her job and had thought she had signed up for health insurance.  Unfortunately, pt had not confirmed her insurance at the last step of enrollment and is not actually insured.  Pt has contacted her employer to see if given the circumstance an exception can be made for her to immediately enroll rather than wait for the regular annual enrollment period.  Pt will work with billing regarding the hospital bills that have already accumulated. ?Type of concern: Medical bills ?Food access concerns: no ?Religious or spiritual practice: yes ?Services Currently in place:  none ? ?Coping/ Adjustment to diagnosis: ?Patient understands treatment plan and what happens next? yes ?Concerns about diagnosis and/or treatment:  paying out of pocket for treatment already provided ?Patient reported stressors: Insurance ?Hopes and priorities: Pt's priority is to initiate treatment w/ the hope of having mild side effects and continuing to work throughout treatment. ?Patient enjoys time with family/  friends ?Current coping skills/ strengths: Supportive family/friends  ? ? ? SUMMARY: ?Current SDOH Barriers:  ?Financial constraints related to lack of insurance ? ?Clinical Social Work Clinical Goal(s):  ?patient will work with her employer to address needs related to insurance.  As well as billing at the hospital to arrange a payment plan for medical bills already accrued. ? ?Interventions: ?Discussed common feeling and emotions when being diagnosed with cancer, and the importance of support during treatment ?Informed patient of the support team roles and support services at [facility] ?Provided CSW contact information and encouraged patient to call with any questions or concerns ?Provided patient with information about the [facility].  Pt has already spoken with the financial counselor and will speak to billing. ? ? ?Follow Up Plan: Patient will contact CSW with any support or resource needs ?Patient verbalizes understanding of plan: Yes ? ? ? ?Henriette Combs, [facility] Social Work  ?Initial Assessment ? ? ?

## 2021-05-16 ENCOUNTER — Other Ambulatory Visit (HOSPITAL_COMMUNITY): Payer: Self-pay

## 2021-05-16 ENCOUNTER — Encounter (HOSPITAL_COMMUNITY): Payer: Self-pay | Admitting: Hematology

## 2021-05-16 ENCOUNTER — Inpatient Hospital Stay (HOSPITAL_COMMUNITY): Payer: Self-pay

## 2021-05-16 DIAGNOSIS — C186 Malignant neoplasm of descending colon: Secondary | ICD-10-CM

## 2021-05-16 MED ORDER — LIDOCAINE-PRILOCAINE 2.5-2.5 % EX CREA
TOPICAL_CREAM | CUTANEOUS | 3 refills | Status: DC
  Filled 2021-05-16: qty 30, 30d supply, fill #0

## 2021-05-16 MED ORDER — PROCHLORPERAZINE MALEATE 10 MG PO TABS
10.0000 mg | ORAL_TABLET | Freq: Four times a day (QID) | ORAL | 1 refills | Status: DC | PRN
  Filled 2021-05-16: qty 30, 8d supply, fill #0

## 2021-05-16 NOTE — Telephone Encounter (Signed)
Oral Oncology Patient Advocate Encounter ? ?I spoke with Anderson Malta this morning to set up delivery of Capecitabine (Xeloda).  Address verified for shipment and will be filled through [facility] and mailed 05/16/21 for delivery 05/17/21. Patient to start cycle on 05/20/21.    ? ?Patient will use [email or web address] pharmacy for future fills to obtain med at a cheaper price.   ? ?Dennison Nancy CPHT ?Specialty Pharmacy Patient Advocate ?[place] ?Phone [phone] ?Fax [phone] ?05/16/2021 3:03 PM ? ? ?

## 2021-05-16 NOTE — Progress Notes (Signed)

## 2021-05-17 NOTE — Progress Notes (Signed)
Pharmacist Chemotherapy Monitoring - Initial Assessment   ? ?Anticipated start date: 05/20/21  ? ?The following has been reviewed per standard work regarding the patient's treatment regimen: ?The patient's diagnosis, treatment plan and drug doses, and organ/hematologic function ?Lab orders and baseline tests specific to treatment regimen  ?The treatment plan start date, drug sequencing, and pre-medications ?Prior authorization status  ?Patient's documented medication list, including drug-drug interaction screen and prescriptions for anti-emetics and supportive care specific to the treatment regimen ?The drug concentrations, fluid compatibility, administration routes, and timing of the medications to be used ?The patient's access for treatment and lifetime cumulative dose history, if applicable  ?The patient's medication allergies and previous infusion related reactions, if applicable  ? ?Changes made to treatment plan:  ?N/A ? ?Follow up needed:  ?N/A ? ? ?Wynona Neat, [facility], ?05/17/2021  11:08 AM  ?

## 2021-05-20 ENCOUNTER — Inpatient Hospital Stay (HOSPITAL_COMMUNITY): Payer: Self-pay

## 2021-05-20 VITALS — BP 118/77 | HR 71 | Temp 97.6°F | Resp 18

## 2021-05-20 DIAGNOSIS — C186 Malignant neoplasm of descending colon: Secondary | ICD-10-CM

## 2021-05-20 DIAGNOSIS — C182 Malignant neoplasm of ascending colon: Secondary | ICD-10-CM

## 2021-05-20 LAB — COMPREHENSIVE METABOLIC PANEL
ALT: 15 U/L (ref 0–44)
AST: 16 U/L (ref 15–41)
Albumin: 3.8 g/dL (ref 3.5–5.0)
Alkaline Phosphatase: 77 U/L (ref 38–126)
Anion gap: 5 (ref 5–15)
BUN: 24 mg/dL — ABNORMAL HIGH (ref 6–20)
CO2: 24 mmol/L (ref 22–32)
Calcium: 8.6 mg/dL — ABNORMAL LOW (ref 8.9–10.3)
Chloride: 107 mmol/L (ref 98–111)
Creatinine, Ser: 0.96 mg/dL (ref 0.44–1.00)
GFR, Estimated: >60 mL/min (ref >60)
Glucose, Bld: 130 mg/dL — ABNORMAL HIGH (ref 70–99)
Potassium: 3.7 mmol/L (ref 3.5–5.1)
Sodium: 136 mmol/L (ref 135–145)
Total Bilirubin: 0.2 mg/dL — ABNORMAL LOW (ref 0.3–1.2)
Total Protein: 6.6 g/dL (ref 6.5–8.1)

## 2021-05-20 LAB — CBC WITH DIFFERENTIAL/PLATELET
Abs Immature Granulocytes: 0.03 10*3/uL (ref 0.00–0.07)
Basophils Absolute: 0 10*3/uL (ref 0.0–0.1)
Basophils Relative: 1 %
Eosinophils Absolute: 0.3 10*3/uL (ref 0.0–0.5)
Eosinophils Relative: 4 %
HCT: 33.4 % — ABNORMAL LOW (ref 36.0–46.0)
Hemoglobin: 11.5 g/dL — ABNORMAL LOW (ref 12.0–15.0)
Immature Granulocytes: 1 %
Lymphocytes Relative: 20 %
Lymphs Abs: 1.3 10*3/uL (ref 0.7–4.0)
MCH: 28.5 pg (ref 26.0–34.0)
MCHC: 34.4 g/dL (ref 30.0–36.0)
MCV: 82.9 fL (ref 80.0–100.0)
Monocytes Absolute: 0.4 10*3/uL (ref 0.1–1.0)
Monocytes Relative: 5 %
Neutro Abs: 4.6 10*3/uL (ref 1.7–7.7)
Neutrophils Relative %: 69 %
Platelets: 218 10*3/uL (ref 150–400)
RBC: 4.03 MIL/uL (ref 3.87–5.11)
RDW: 14.2 % (ref 11.5–15.5)
WBC: 6.6 10*3/uL (ref 4.0–10.5)
nRBC: 0 % (ref 0.0–0.2)

## 2021-05-20 LAB — MAGNESIUM: Magnesium: 1.9 mg/dL (ref 1.7–2.4)

## 2021-05-20 MED ORDER — HEPARIN SOD (PORK) LOCK FLUSH 100 UNIT/ML IV SOLN
500.0000 [IU] | Freq: Once | INTRAVENOUS | Status: AC | PRN
  Administered 2021-05-20: 500 [IU]

## 2021-05-20 MED ORDER — SODIUM CHLORIDE 0.9 % IV SOLN
10.0000 mg | Freq: Once | INTRAVENOUS | Status: AC
  Administered 2021-05-20: 10 mg via INTRAVENOUS
  Filled 2021-05-20: qty 10

## 2021-05-20 MED ORDER — SODIUM CHLORIDE 0.9% FLUSH
10.0000 mL | INTRAVENOUS | Status: DC | PRN
  Administered 2021-05-20: 10 mL

## 2021-05-20 MED ORDER — OXALIPLATIN CHEMO INJECTION 100 MG/20ML
130.0000 mg/m2 | Freq: Once | INTRAVENOUS | Status: AC
  Administered 2021-05-20: 275 mg via INTRAVENOUS
  Filled 2021-05-20: qty 55

## 2021-05-20 MED ORDER — DEXTROSE 5 % IV SOLN: Freq: Once | INTRAVENOUS | Status: AC

## 2021-05-20 MED ORDER — PALONOSETRON HCL INJECTION 0.25 MG/5ML
0.2500 mg | Freq: Once | INTRAVENOUS | Status: AC
  Administered 2021-05-20: 0.25 mg via INTRAVENOUS
  Filled 2021-05-20: qty 5

## 2021-05-20 NOTE — Patient Instructions (Signed)
[facility]  Discharge Instructions: ?Thank you for choosing [facility] to provide your oncology and hematology care.  ?If you have a lab appointment with the [facility], please come in thru the Main Entrance and check in at the main information desk. ? ?Wear comfortable clothing and clothing appropriate for easy access to any Portacath or PICC line.  ? ?We strive to give you quality time with your provider. You may need to reschedule your appointment if you arrive late (15 or more minutes).  Arriving late affects you and other patients whose appointments are after yours.  Also, if you miss three or more appointments without notifying the office, you may be dismissed from the clinic at the provider?s discretion.    ?  ?For prescription refill requests, have your pharmacy contact our office and allow 72 hours for refills to be completed.   ? ?Today you received the following chemotherapy and/or immunotherapy agents C1D1 Oxaliplatin. ?  ?To help prevent nausea and vomiting after your treatment, we encourage you to take your nausea medication as directed. ? ?BELOW ARE SYMPTOMS THAT SHOULD BE REPORTED IMMEDIATELY: ?*FEVER GREATER THAN 100.4 F (38 ?C) OR HIGHER ?*CHILLS OR SWEATING ?*NAUSEA AND VOMITING THAT IS NOT CONTROLLED WITH YOUR NAUSEA MEDICATION ?*UNUSUAL SHORTNESS OF BREATH ?*UNUSUAL BRUISING OR BLEEDING ?*URINARY PROBLEMS (pain or burning when urinating, or frequent urination) ?*BOWEL PROBLEMS (unusual diarrhea, constipation, pain near the anus) ?TENDERNESS IN MOUTH AND THROAT WITH OR WITHOUT PRESENCE OF ULCERS (sore throat, sores in mouth, or a toothache) ?UNUSUAL RASH, SWELLING OR PAIN  ?UNUSUAL VAGINAL DISCHARGE OR ITCHING  ? ?Items with * indicate a potential emergency and should be followed up as soon as possible or go to the Emergency Department if any problems should occur. ? ?Please show the CHEMOTHERAPY ALERT CARD or IMMUNOTHERAPY ALERT CARD at check-in to the Emergency  Department and triage nurse. ? ?Should you have questions after your visit or need to cancel or reschedule your appointment, please contact [facility] [phone]  and follow the prompts.  Office hours are 8:00 a.m. to 4:30 p.m. Monday - Friday. Please note that voicemails left after 4:00 p.m. may not be returned until the following business day.  We are closed weekends and major holidays. You have access to a nurse at all times for urgent questions. Please call the main number to the clinic [phone] and follow the prompts. ? ?For any non-urgent questions, you may also contact your provider using MyChart. We now offer e-Visits for anyone 26 and older to request care online for non-urgent symptoms. For details visit [email or web address]. ?  ?Also download the MyChart app! Go to the app store, search "MyChart", open the app, select [place], and log in with your MyChart username and password. ? ?Due to Covid, a mask is required upon entering the hospital/clinic. If you do not have a mask, one will be given to you upon arrival. For doctor visits, patients may have 1 support person aged 41 or older with them. For treatment visits, patients cannot have anyone with them due to current Covid guidelines and our immunocompromised population.  ?

## 2021-05-20 NOTE — Progress Notes (Signed)
Pt presents today C1D1 Oxaliplatin per provider's order. Vital signs and labs WNL for treatment. Okay to proceed with treatment today. ? ?C1D1 Oxaliplatin given today per MD orders. Tolerated infusion without adverse affects. Vital signs stable. No complaints at this time. Discharged from clinic ambulatory in stable condition. Alert and oriented x 3. F/U with [facility] as scheduled.   ?

## 2021-05-21 ENCOUNTER — Telehealth (HOSPITAL_COMMUNITY): Payer: Self-pay | Admitting: *Deleted

## 2021-05-21 LAB — CEA: CEA: 1.7 ng/mL (ref 0.0–4.7)

## 2021-05-21 NOTE — Telephone Encounter (Signed)
24 hour chemotherapy follow-up called placed today. Pt denies shortness of breath, vomiting, headaches, and diarrhea at this time. Pt advised to call the clinic if needed, pt verbalized understanding. ?

## 2021-05-22 ENCOUNTER — Inpatient Hospital Stay (HOSPITAL_COMMUNITY): Payer: Self-pay

## 2021-05-26 NOTE — Progress Notes (Signed)
[place], [place] [address]   CLINIC:  Medical Oncology/Hematology  PCP:  Sharion Balloon, [address] / [place] [address] [phone]   REASON FOR VISIT:   New start chemotherapy follow-up visit - stage IIIB colon cancer  CURRENT THERAPY: CapeOx (capecitabine + oxaliplatin)  LAST TREATMENT DATE: 05/20/2021 (Day 1//Cycle 1)  BRIEF ONCOLOGIC HISTORY:  Oncology History  Colon cancer ([place])  05/09/2021 Initial Diagnosis   Colon cancer ([place])    05/20/2021 -  Chemotherapy   Patient is on Treatment Plan : COLORECTAL Xelox (Capeox) q21d        CANCER STAGING:  Cancer Staging  Colon cancer [facility]) Staging form: Colon and Rectum, AJCC 8th Edition - Clinical stage from 05/09/2021: Stage IIIB (cT3, cN1b, cM0) - Unsigned   INTERVAL HISTORY:  Alison Ramsey, a 51 y.o. female, is managed by Dr. Delton Coombes for stage IIIb colon cancer.  She received first cycle of treatment with oxaliplatin on 05/20/2021, along with capecitabine 2 weeks on/1 week off starting on 05/20/2021. She is seen today for toxicity check and follow-up of new start chemo.  She has been feeling fairly well after receiving treatment. Her main complaint is diarrhea.  She has been having diarrhea since starting oxaliplatin and capecitabine.  On Saturday, she reports that her diarrhea was severe, with 8+ watery bowel movements throughout the day, despite taking Imodium as prescribed.  Diarrhea has been more mild yesterday and today, with 2-3 watery bowel movements daily.  No associated fever, chills, nausea, vomiting, or abdominal pain.  She has had cold sensitivity neuropathy as well as symptoms of pharyngeal laryngeal dysesthesia with description of "her throat seizing up" after her oxaliplatin infusion last Monday.  Her "throat spasms" lasted a few hours before resolving on their own.  She has had ankle swelling since starting cycle 1 of chemotherapy.  Ankle swelling  improves overnight and is better in the mornings. She denies any rashes, skin changes, or mouth sores. She has not had any new pain.  She reports poor energy, states that she feels "tired and sleepy" throughout the day with energy about 30 to 40%.  She has maintained a good appetite and stable weight at 216 pounds. She is maintaining excellent hydration and drinking at least 100 ounces of water each day.   REVIEW OF SYSTEMS:  Review of Systems  Constitutional:  Positive for fatigue. Negative for appetite change, chills, diaphoresis, fever and unexpected weight change.  HENT:   Positive for trouble swallowing (after oxaliplatin infusion, resolved). Negative for lump/mass and nosebleeds.   Eyes:  Negative for eye problems.  Respiratory:  Negative for cough, hemoptysis and shortness of breath.   Cardiovascular:  Negative for chest pain, leg swelling and palpitations.  Gastrointestinal:  Positive for diarrhea. Negative for abdominal pain, blood in stool, constipation, nausea and vomiting.  Genitourinary:  Positive for dysuria. Negative for hematuria.   Skin: Negative.   Neurological:  Positive for headaches and numbness (cold sensitivity). Negative for dizziness and light-headedness.  Hematological:  Does not bruise/bleed easily.    PAST MEDICAL/SURGICAL HISTORY:  Past Medical History:  Diagnosis Date   Cancer ([place])    Hypertension    Lyme disease 2018   Past Surgical History:  Procedure Laterality Date   ABDOMINAL HYSTERECTOMY  2013   BIOPSY  04/13/2021   Procedure: BIOPSY;  Surgeon: [name], DO;  Location: [facility] ENDO SUITE;  Service: Endoscopy;;  descending colon mass  FLEXIBLE SIGMOIDOSCOPY N/A 04/13/2021   Procedure: FLEXIBLE SIGMOIDOSCOPY;  Surgeon: [name], DO;  Location: [facility] ENDO SUITE;  Service: Endoscopy;  Laterality: N/A;   PARTIAL COLECTOMY N/A 04/15/2021   Procedure: EXTENDED RIGHT HEMICOLECTOMY;  Surgeon: Aviva Signs, MD;  Location: [facility] ORS;  Service:  General;  Laterality: N/A;   PORTACATH PLACEMENT Left 05/13/2021   Procedure: INSERTION PORT-A-CATH;  Surgeon: Aviva Signs, MD;  Location: [facility] ORS;  Service: General;  Laterality: Left;   WISDOM TOOTH EXTRACTION      SOCIAL HISTORY:  Social History   Socioeconomic History   Marital status: Married    Spouse name: Not on file   Number of children: Not on file   Years of education: Not on file   Highest education level: Not on file  Occupational History   Not on file  Tobacco Use   Smoking status: Never   Smokeless tobacco: Never  Vaping Use   Vaping Use: Never used  Substance and Sexual Activity   Alcohol use: Yes    Comment: occasionally   Drug use: Yes    Types: Marijuana   Sexual activity: Yes  Other Topics Concern   Not on file  Social History Narrative   Not on file   Social Determinants of Health   Financial Resource Strain: Not on file  Food Insecurity: Not on file  Transportation Needs: Not on file  Physical Activity: Not on file  Stress: Not on file  Social Connections: Not on file  Intimate Partner Violence: Not on file    FAMILY HISTORY: Family history has been reviewed by me and is documented elsewhere in the electronic medical record.  CURRENT MEDICATIONS: Current medications have been reviewed by me and are documented elsewhere in electronic medical record.  ALLERGIES: Drug allergies have been reviewed by me and are documented elsewhere in the electronic medical record.  PHYSICAL EXAM:  Performance status (ECOG): 1 - Symptomatic but completely ambulatory There were no vitals filed for this visit. Wt Readings from Last 3 Encounters:  05/27/21 216 lb 3.2 oz (98.1 kg)  05/20/21 216 lb (98 kg)  05/13/21 213 lb 13.5 oz (97 kg)   Physical Exam Constitutional:      Appearance: Normal appearance. She is obese.  HENT:     Head: Normocephalic and atraumatic.     Mouth/Throat:     Mouth: Mucous membranes are moist.  Eyes:     Extraocular Movements:  Extraocular movements intact.     Pupils: Pupils are equal, round, and reactive to light.  Cardiovascular:     Rate and Rhythm: Normal rate and regular rhythm.     Pulses: Normal pulses.     Heart sounds: Normal heart sounds.  Pulmonary:     Effort: Pulmonary effort is normal.     Breath sounds: Normal breath sounds.  Abdominal:     General: Bowel sounds are normal.     Palpations: Abdomen is soft.     Tenderness: There is no abdominal tenderness.  Musculoskeletal:        General: No swelling.     Right lower leg: Edema (1+ pitting edema of ankle) present.     Left lower leg: Edema (1+ pitting edema of ankle) present.  Lymphadenopathy:     Cervical: No cervical adenopathy.  Skin:    General: Skin is warm and dry.  Neurological:     General: No focal deficit present.     Mental Status: She is alert and oriented to person, place,  and time.  Psychiatric:        Mood and Affect: Mood normal.        Behavior: Behavior normal.     LABORATORY DATA: Relevant labs have been reviewed by me and are documented elsewhere in the electronic medical record.  DIAGNOSTIC IMAGING: Relevant imaging has been reviewed by me and are documented elsewhere in the electronic medical record.  ASSESSMENT & PLAN: 1.  Stage IIIb colon cancer - Primary oncologist is Dr. Delton Coombes - She is on CapeOx protocol every 21 days - Received first cycle of treatment with oxaliplatin on 05/20/2021, along with capecitabine 2 weeks on/1 week off starting on 05/20/2021.  - Side effects/symptoms of toxicity: Main side effect is diarrhea (addressed below).  She is also experiencing laryngopharyngeal dysesthesia, cold sensitivity neuropathy, and mild ankle swelling. - CBC (05/27/21 ): Stable normocytic anemia with Hgb 11.3/MCV 81.0. - CMP (05/27/21): Normal LFTs and kidney function.  Normal potassium and magnesium. - PLAN:  Patient is scheduled for labs, MD visit with Dr. Delton Coombes, and next cycle of treatment on  06/10/2021  2.  Diarrhea -Daily diarrhea since starting oxaliplatin and capecitabine. - On Saturday 05/25/2021, she reports that her diarrhea was severe, with 8+ watery bowel movements throughout the day.  Diarrhea has been more mild yesterday and today, with 2-3 watery bowel movements daily. - No associated fever, chills, nausea, vomiting, or abdominal pain. - She is taking Imodium as prescribed, but without adequate control of diarrhea. - PLAN: Continue Imodium 2 capsules after first watery bowel movement followed by 1 capsule after each subsequent watery bowel movement, with maximum of 8 capsules daily. - If patient has >2 watery bowel movements in a day, prescription has been sent for her to take Lomotil up to twice daily as needed for severe diarrhea.   3.  Nutrition & hydration - She has maintained a good appetite and stable weight at 216 pounds. - She is maintaining excellent hydration and drinking at least 100 ounces of water each day. - PLAN: No indication for IV fluids, dietary supplementation, or dietitian referral at this time.  OTHER:  Per patient request, I will switch her current medications prescribed by our clinic to her new preferred pharmacy, [facility] ([place] online [facility]).     All questions were answered. The patient knows to call the clinic with any problems, questions or concerns.  Medical decision making: Moderate  Time spent on visit: I spent 20 minutes counseling the patient face to face. The total time spent in the appointment was 30 minutes and more than 50% was on counseling.   Alison Rush, PA-C  05/27/21 1:38 PM

## 2021-05-27 ENCOUNTER — Encounter (HOSPITAL_COMMUNITY): Payer: Self-pay | Admitting: Physician Assistant

## 2021-05-27 ENCOUNTER — Inpatient Hospital Stay (HOSPITAL_COMMUNITY): Payer: Self-pay

## 2021-05-27 ENCOUNTER — Inpatient Hospital Stay (HOSPITAL_BASED_OUTPATIENT_CLINIC_OR_DEPARTMENT_OTHER): Payer: Self-pay | Admitting: Physician Assistant

## 2021-05-27 DIAGNOSIS — C186 Malignant neoplasm of descending colon: Secondary | ICD-10-CM

## 2021-05-27 DIAGNOSIS — C182 Malignant neoplasm of ascending colon: Secondary | ICD-10-CM

## 2021-05-27 DIAGNOSIS — D500 Iron deficiency anemia secondary to blood loss (chronic): Secondary | ICD-10-CM

## 2021-05-27 DIAGNOSIS — T451X5A Adverse effect of antineoplastic and immunosuppressive drugs, initial encounter: Secondary | ICD-10-CM

## 2021-05-27 DIAGNOSIS — K521 Toxic gastroenteritis and colitis: Secondary | ICD-10-CM

## 2021-05-27 LAB — CBC WITH DIFFERENTIAL/PLATELET
Abs Immature Granulocytes: 0.05 10*3/uL (ref 0.00–0.07)
Basophils Absolute: 0 10*3/uL (ref 0.0–0.1)
Basophils Relative: 1 %
Eosinophils Absolute: 0.2 10*3/uL (ref 0.0–0.5)
Eosinophils Relative: 3 %
HCT: 33.2 % — ABNORMAL LOW (ref 36.0–46.0)
Hemoglobin: 11.3 g/dL — ABNORMAL LOW (ref 12.0–15.0)
Immature Granulocytes: 1 %
Lymphocytes Relative: 22 %
Lymphs Abs: 1.8 10*3/uL (ref 0.7–4.0)
MCH: 27.6 pg (ref 26.0–34.0)
MCHC: 34 g/dL (ref 30.0–36.0)
MCV: 81 fL (ref 80.0–100.0)
Monocytes Absolute: 0.5 10*3/uL (ref 0.1–1.0)
Monocytes Relative: 6 %
Neutro Abs: 5.5 10*3/uL (ref 1.7–7.7)
Neutrophils Relative %: 67 %
Platelets: 291 10*3/uL (ref 150–400)
RBC: 4.1 MIL/uL (ref 3.87–5.11)
RDW: 13.7 % (ref 11.5–15.5)
WBC: 8 10*3/uL (ref 4.0–10.5)
nRBC: 0 % (ref 0.0–0.2)

## 2021-05-27 LAB — MAGNESIUM: Magnesium: 1.8 mg/dL (ref 1.7–2.4)

## 2021-05-27 LAB — COMPREHENSIVE METABOLIC PANEL
ALT: 23 U/L (ref 0–44)
AST: 22 U/L (ref 15–41)
Albumin: 3.8 g/dL (ref 3.5–5.0)
Alkaline Phosphatase: 67 U/L (ref 38–126)
Anion gap: 5 (ref 5–15)
BUN: 12 mg/dL (ref 6–20)
CO2: 25 mmol/L (ref 22–32)
Calcium: 8.5 mg/dL — ABNORMAL LOW (ref 8.9–10.3)
Chloride: 105 mmol/L (ref 98–111)
Creatinine, Ser: 0.79 mg/dL (ref 0.44–1.00)
GFR, Estimated: >60 mL/min (ref >60)
Glucose, Bld: 144 mg/dL — ABNORMAL HIGH (ref 70–99)
Potassium: 3.8 mmol/L (ref 3.5–5.1)
Sodium: 135 mmol/L (ref 135–145)
Total Bilirubin: 0.4 mg/dL (ref 0.3–1.2)
Total Protein: 6.4 g/dL — ABNORMAL LOW (ref 6.5–8.1)

## 2021-05-27 MED ORDER — LIDOCAINE-PRILOCAINE 2.5-2.5 % EX CREA: TOPICAL_CREAM | CUTANEOUS | 3 refills | Status: DC

## 2021-05-27 MED ORDER — CAPECITABINE 500 MG PO TABS: 2000.0000 mg | ORAL_TABLET | Freq: Two times a day (BID) | ORAL | 3 refills | Status: DC

## 2021-05-27 MED ORDER — PROCHLORPERAZINE MALEATE 10 MG PO TABS: 10.0000 mg | ORAL_TABLET | Freq: Four times a day (QID) | ORAL | 1 refills | Status: DC | PRN

## 2021-05-27 MED ORDER — DIPHENOXYLATE-ATROPINE 2.5-0.025 MG PO TABS
1.0000 | ORAL_TABLET | Freq: Four times a day (QID) | ORAL | 0 refills | Status: DC | PRN
  Filled 2021-05-27: qty 30, 8d supply, fill #0

## 2021-05-27 NOTE — Patient Instructions (Signed)
[place] at [facility] Discharge Instructions  You were seen today by Tarri Abernethy PA-C for your chemotherapy follow-up visit.  Overall, you are doing very well tolerating your chemotherapy!  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  It usually occurs after infusion, and lasts less than a few hours.  There is no treatment needed at this time, but if you have severe symptoms of this in the future, please let us know so that we can change how quickly you receive your chemotherapy infusions.  ANKLE SWELLING: This is likely due to your capecitabine (Xeloda).  If it is severe or does not improve overnight, please let us know.  Otherwise, there is no treatment needed at this time.  DIARRHEA: Continue to take Imodium after each loose bowel movement.  However, if you are having >2 watery bowel movements daily, I will also send a prescription for LOMOTIL, which you can take for severe diarrhea in addition to the Imodium.  Keep up the great work with your nutrition and hydration!  You are scheduled for follow-up visit with Dr. Delton Coombes on 06/10/2021, and we will start your second cycle of chemotherapy at that time.  If you have any issues before then, please do not hesitate to reach out.    Thank you for choosing [place] at [facility] to provide your oncology and hematology care.  To afford each patient quality time with our provider, please arrive at least 15 minutes before your scheduled appointment time.   If you have a lab appointment with the [facility] please come in thru the Main Entrance and check in at the main information desk.  You need to re-schedule your appointment  should you arrive 10 or more minutes late.  We strive to give you quality time with our providers, and arriving late affects you and other patients whose appointments are after yours.  Also, if you no show three or more times for appointments you may be dismissed from the clinic at the providers discretion.     Again, thank you for choosing [facility].  Our hope is that these requests will decrease the amount of time that you wait before being seen by our physicians.       _____________________________________________________________  Should you have questions after your visit to [facility], please contact our office at [phone] and follow the prompts.  Our office hours are 8:00 a.m. and 4:30 p.m. Monday - Friday.  Please note that voicemails left after 4:00 p.m. may not be returned until the following business day.  We are closed weekends and major holidays.  You do have access to a nurse 24-7, just call the main number to the clinic [phone] and do not press any options, hold on the line and a nurse will answer the phone.    For prescription refill requests, have your pharmacy contact our office and allow 72 hours.    Due to Covid, you will need to wear a mask upon entering the hospital. If you do not have a mask, a mask will be given to you at the Main Entrance upon arrival. For doctor visits, patients may have 1 support person age 98 or older  with them. For treatment visits, patients can not have anyone with them due to social distancing guidelines and our immunocompromised population.

## 2021-05-28 ENCOUNTER — Other Ambulatory Visit (HOSPITAL_COMMUNITY): Payer: Self-pay

## 2021-05-28 ENCOUNTER — Other Ambulatory Visit: Payer: Self-pay | Admitting: Genetic Counselor

## 2021-05-28 ENCOUNTER — Encounter (HOSPITAL_COMMUNITY): Payer: Self-pay | Admitting: Hematology

## 2021-05-28 ENCOUNTER — Encounter: Payer: Self-pay | Admitting: Genetic Counselor

## 2021-05-28 DIAGNOSIS — C186 Malignant neoplasm of descending colon: Secondary | ICD-10-CM

## 2021-05-28 LAB — CEA: CEA: 1.9 ng/mL (ref 0.0–4.7)

## 2021-05-29 ENCOUNTER — Inpatient Hospital Stay: Payer: Self-pay | Admitting: Genetic Counselor

## 2021-05-29 ENCOUNTER — Inpatient Hospital Stay: Payer: Self-pay

## 2021-05-29 ENCOUNTER — Telehealth: Payer: Self-pay | Admitting: Hematology

## 2021-05-29 NOTE — Telephone Encounter (Signed)
.  Called pt per 5/23 inbasket , Patient was unavailable, a message with appt time and date was left with number on file.   

## 2021-06-10 ENCOUNTER — Inpatient Hospital Stay (HOSPITAL_COMMUNITY): Payer: Self-pay

## 2021-06-10 ENCOUNTER — Inpatient Hospital Stay (HOSPITAL_COMMUNITY): Payer: Self-pay | Attending: Hematology

## 2021-06-10 ENCOUNTER — Inpatient Hospital Stay (HOSPITAL_BASED_OUTPATIENT_CLINIC_OR_DEPARTMENT_OTHER): Payer: Self-pay | Admitting: Hematology

## 2021-06-10 ENCOUNTER — Other Ambulatory Visit (HOSPITAL_COMMUNITY): Payer: Self-pay

## 2021-06-10 VITALS — BP 150/93 | HR 98 | Temp 98.0°F | Resp 18

## 2021-06-10 VITALS — BP 128/83 | HR 80 | Temp 98.8°F | Resp 18 | Ht 64.69 in | Wt 223.1 lb

## 2021-06-10 DIAGNOSIS — C186 Malignant neoplasm of descending colon: Secondary | ICD-10-CM

## 2021-06-10 DIAGNOSIS — Z5111 Encounter for antineoplastic chemotherapy: Secondary | ICD-10-CM | POA: Insufficient documentation

## 2021-06-10 DIAGNOSIS — D500 Iron deficiency anemia secondary to blood loss (chronic): Secondary | ICD-10-CM

## 2021-06-10 DIAGNOSIS — C182 Malignant neoplasm of ascending colon: Secondary | ICD-10-CM

## 2021-06-10 DIAGNOSIS — Z79899 Other long term (current) drug therapy: Secondary | ICD-10-CM | POA: Insufficient documentation

## 2021-06-10 LAB — COMPREHENSIVE METABOLIC PANEL
ALT: 34 U/L (ref 0–44)
AST: 30 U/L (ref 15–41)
Albumin: 4.1 g/dL (ref 3.5–5.0)
Alkaline Phosphatase: 83 U/L (ref 38–126)
Anion gap: 4 — ABNORMAL LOW (ref 5–15)
BUN: 13 mg/dL (ref 6–20)
CO2: 25 mmol/L (ref 22–32)
Calcium: 8.8 mg/dL — ABNORMAL LOW (ref 8.9–10.3)
Chloride: 107 mmol/L (ref 98–111)
Creatinine, Ser: 0.86 mg/dL (ref 0.44–1.00)
GFR, Estimated: >60 mL/min (ref >60)
Glucose, Bld: 142 mg/dL — ABNORMAL HIGH (ref 70–99)
Potassium: 3.7 mmol/L (ref 3.5–5.1)
Sodium: 136 mmol/L (ref 135–145)
Total Bilirubin: 0.6 mg/dL (ref 0.3–1.2)
Total Protein: 6.9 g/dL (ref 6.5–8.1)

## 2021-06-10 LAB — CBC WITH DIFFERENTIAL/PLATELET
Abs Immature Granulocytes: 0.05 10*3/uL (ref 0.00–0.07)
Basophils Absolute: 0 10*3/uL (ref 0.0–0.1)
Basophils Relative: 1 %
Eosinophils Absolute: 0.1 10*3/uL (ref 0.0–0.5)
Eosinophils Relative: 2 %
HCT: 34.3 % — ABNORMAL LOW (ref 36.0–46.0)
Hemoglobin: 11.7 g/dL — ABNORMAL LOW (ref 12.0–15.0)
Immature Granulocytes: 1 %
Lymphocytes Relative: 23 %
Lymphs Abs: 1.5 10*3/uL (ref 0.7–4.0)
MCH: 28.7 pg (ref 26.0–34.0)
MCHC: 34.1 g/dL (ref 30.0–36.0)
MCV: 84.1 fL (ref 80.0–100.0)
Monocytes Absolute: 0.6 10*3/uL (ref 0.1–1.0)
Monocytes Relative: 9 %
Neutro Abs: 4.3 10*3/uL (ref 1.7–7.7)
Neutrophils Relative %: 64 %
Platelets: 274 10*3/uL (ref 150–400)
RBC: 4.08 MIL/uL (ref 3.87–5.11)
RDW: 17.8 % — ABNORMAL HIGH (ref 11.5–15.5)
WBC: 6.6 10*3/uL (ref 4.0–10.5)
nRBC: 0 % (ref 0.0–0.2)

## 2021-06-10 LAB — IRON AND TIBC
Iron: 40 ug/dL (ref 28–170)
Saturation Ratios: 9 % — ABNORMAL LOW (ref 10.4–31.8)
TIBC: 462 ug/dL — ABNORMAL HIGH (ref 250–450)
UIBC: 422 ug/dL

## 2021-06-10 LAB — FERRITIN: Ferritin: 21 ng/mL (ref 11–307)

## 2021-06-10 LAB — MAGNESIUM: Magnesium: 2.2 mg/dL (ref 1.7–2.4)

## 2021-06-10 MED ORDER — HEPARIN SOD (PORK) LOCK FLUSH 100 UNIT/ML IV SOLN
500.0000 [IU] | Freq: Once | INTRAVENOUS | Status: AC | PRN
  Administered 2021-06-10: 500 [IU]

## 2021-06-10 MED ORDER — PALONOSETRON HCL INJECTION 0.25 MG/5ML
0.2500 mg | Freq: Once | INTRAVENOUS | Status: AC
  Administered 2021-06-10: 0.25 mg via INTRAVENOUS

## 2021-06-10 MED ORDER — SODIUM CHLORIDE 0.9% FLUSH
10.0000 mL | INTRAVENOUS | Status: DC | PRN
  Administered 2021-06-10: 10 mL

## 2021-06-10 MED ORDER — FAMOTIDINE IN NACL 20-0.9 MG/50ML-% IV SOLN
20.0000 mg | Freq: Once | INTRAVENOUS | Status: AC
  Administered 2021-06-10: 20 mg via INTRAVENOUS

## 2021-06-10 MED ORDER — DIPHENHYDRAMINE HCL 25 MG PO CAPS
25.0000 mg | ORAL_CAPSULE | Freq: Once | ORAL | Status: AC
  Administered 2021-06-10: 25 mg via ORAL

## 2021-06-10 MED ORDER — SODIUM CHLORIDE 0.9 % IV SOLN
10.0000 mg | Freq: Once | INTRAVENOUS | Status: AC
  Administered 2021-06-10: 10 mg via INTRAVENOUS
  Filled 2021-06-10: qty 10

## 2021-06-10 MED ORDER — DEXTROSE 5 % IV SOLN: Freq: Once | INTRAVENOUS | Status: AC

## 2021-06-10 MED ORDER — OXALIPLATIN CHEMO INJECTION 100 MG/20ML
130.0000 mg/m2 | Freq: Once | INTRAVENOUS | Status: AC
  Administered 2021-06-10: 275 mg via INTRAVENOUS
  Filled 2021-06-10: qty 55

## 2021-06-10 MED ORDER — CAPECITABINE 500 MG PO TABS
2000.0000 mg | ORAL_TABLET | Freq: Two times a day (BID) | ORAL | 3 refills | Status: DC
  Filled 2021-06-10 (×2): qty 112, 14d supply, fill #0

## 2021-06-10 NOTE — Patient Instructions (Signed)
[facility]  Discharge Instructions: Thank you for choosing [facility] to provide your oncology and hematology care.  If you have a lab appointment with the [facility], please come in thru the Main Entrance and check in at the main information desk.  Wear comfortable clothing and clothing appropriate for easy access to any Portacath or PICC line.   We strive to give you quality time with your provider. You may need to reschedule your appointment if you arrive late (15 or more minutes).  Arriving late affects you and other patients whose appointments are after yours.  Also, if you miss three or more appointments without notifying the office, you may be dismissed from the clinic at the provider's discretion.      For prescription refill requests, have your pharmacy contact our office and allow 72 hours for refills to be completed.    Today you received the following chemotherapy and/or immunotherapy agents Oxaliplatin, return as scheduled    To help prevent nausea and vomiting after your treatment, we encourage you to take your nausea medication as directed.  BELOW ARE SYMPTOMS THAT SHOULD BE REPORTED IMMEDIATELY: *FEVER GREATER THAN 100.4 F (38 C) OR HIGHER *CHILLS OR SWEATING *NAUSEA AND VOMITING THAT IS NOT CONTROLLED WITH YOUR NAUSEA MEDICATION *UNUSUAL SHORTNESS OF BREATH *UNUSUAL BRUISING OR BLEEDING *URINARY PROBLEMS (pain or burning when urinating, or frequent urination) *BOWEL PROBLEMS (unusual diarrhea, constipation, pain near the anus) TENDERNESS IN MOUTH AND THROAT WITH OR WITHOUT PRESENCE OF ULCERS (sore throat, sores in mouth, or a toothache) UNUSUAL RASH, SWELLING OR PAIN  UNUSUAL VAGINAL DISCHARGE OR ITCHING   Items with * indicate a potential emergency and should be followed up as soon as possible or go to the Emergency Department if any problems should occur.  Please show the CHEMOTHERAPY ALERT CARD or IMMUNOTHERAPY ALERT CARD at check-in  to the Emergency Department and triage nurse.  Should you have questions after your visit or need to cancel or reschedule your appointment, please contact [facility] [phone]  and follow the prompts.  Office hours are 8:00 a.m. to 4:30 p.m. Monday - Friday. Please note that voicemails left after 4:00 p.m. may not be returned until the following business day.  We are closed weekends and major holidays. You have access to a nurse at all times for urgent questions. Please call the main number to the clinic [phone] and follow the prompts.  For any non-urgent questions, you may also contact your provider using MyChart. We now offer e-Visits for anyone 2 and older to request care online for non-urgent symptoms. For details visit [email or web address].   Also download the MyChart app! Go to the app store, search "MyChart", open the app, select , and log in with your MyChart username and password.  Due to Covid, a mask is required upon entering the hospital/clinic. If you do not have a mask, one will be given to you upon arrival. For doctor visits, patients may have 1 support person aged 42 or older with them. For treatment visits, patients cannot have anyone with them due to current Covid guidelines and our immunocompromised population.

## 2021-06-10 NOTE — Progress Notes (Signed)
Alison Ramsey, Alison Ramsey   CLINIC:  Medical Oncology/Hematology  PCP:  Alison Ramsey, [address] [place] / [place] [address] [phone]   REASON FOR VISIT:  Follow-up for descending colon cancer  PRIOR THERAPY: Left hemicolectomy (04/15/2021): Moderately differentiated adenocarcinoma, grade 2, 2/35 lymph nodes positive, margins negative, PT3PN1B.  MMR preserved.  NGS Results: not done  CURRENT THERAPY: Xelox (Capeox) q21d  BRIEF ONCOLOGIC HISTORY:  Oncology History  Colon cancer ([place])  05/09/2021 Initial Diagnosis   Colon cancer ([place])    05/20/2021 -  Chemotherapy   Patient is on Treatment Plan : COLORECTAL Xelox (Capeox) q21d        CANCER STAGING:  Cancer Staging  Colon cancer [facility]) Staging form: Colon and Rectum, AJCC 8th Edition - Clinical stage from 05/09/2021: Stage IIIB (cT3, cN1b, cM0) - Unsigned   INTERVAL HISTORY:  [name], a 51 y.o. female, returns for routine follow-up and consideration for next cycle of chemotherapy. Alison Ramsey was last seen on 05//04/2021.  Due for cycle #2 of Xelox today.   Overall, she tells me she has been feeling pretty well. She reports watery diarrhea occurring about 3 times daily starting 3 weeks ago for which she is taking Imodium and Lomotil which have not helped; she denies correlation between episodes of diarrhea and dietary choices. She denies skin changes on her palms and soles. She denies mouth sores. She reports cold sensitivity for 2-3 day following her last treatment in her hands, mouth, and throat. She denies voice changes or hoarseness. She denies current tingling/numbness. She has started taking 1 iron tablet daily; she reports previously when she has taken iron tablets they have caused constipation.    Overall, she feels ready for next cycle of chemo today.    REVIEW OF SYSTEMS:  Review of Systems  Constitutional:  Negative for appetite change and  fatigue.  HENT:   Positive for trouble swallowing. Negative for voice change.   Gastrointestinal:  Positive for diarrhea.  Neurological:  Negative for numbness.  All other systems reviewed and are negative.  PAST MEDICAL/SURGICAL HISTORY:  Past Medical History:  Diagnosis Date   Cancer ([place])    Hypertension    Lyme disease 2018   Past Surgical History:  Procedure Laterality Date   ABDOMINAL HYSTERECTOMY  2013   BIOPSY  04/13/2021   Procedure: BIOPSY;  Surgeon: [name], DO;  Location: [facility];  Service: Endoscopy;;  descending colon mass   FLEXIBLE SIGMOIDOSCOPY N/A 04/13/2021   Procedure: FLEXIBLE SIGMOIDOSCOPY;  Surgeon: [name], DO;  Location: [facility];  Service: Endoscopy;  Laterality: N/A;   PARTIAL COLECTOMY N/A 04/15/2021   Procedure: EXTENDED RIGHT HEMICOLECTOMY;  Surgeon: Aviva Signs, MD;  Location: [facility] ORS;  Service: General;  Laterality: N/A;   PORTACATH PLACEMENT Left 05/13/2021   Procedure: INSERTION PORT-A-CATH;  Surgeon: Aviva Signs, MD;  Location: [facility] ORS;  Service: General;  Laterality: Left;   WISDOM TOOTH EXTRACTION      SOCIAL HISTORY:  Social History   Socioeconomic History   Marital status: Married    Spouse name: Not on file   Number of children: Not on file   Years of education: Not on file   Highest education level: Not on file  Occupational History   Not on file  Tobacco Use   Smoking status: Never   Smokeless tobacco: Never  Vaping Use   Vaping Use: Never used  Substance  and Sexual Activity   Alcohol use: Yes    Comment: occasionally   Drug use: Yes    Types: Marijuana   Sexual activity: Yes  Other Topics Concern   Not on file  Social History Narrative   Not on file   Social Determinants of Health   Financial Resource Strain: Not on file  Food Insecurity: Not on file  Transportation Needs: Not on file  Physical Activity: Not on file  Stress: Not on file  Social Connections: Not on file  Intimate Partner  Violence: Not on file    FAMILY HISTORY:  Family History  Problem Relation Age of Onset   Kidney disease Mother    Hypertension Mother    Breast cancer Mother    Stroke Father    Colon cancer Maternal Aunt        d. 34s   Other Maternal Aunt        pituitary cancer   Stomach cancer Maternal Uncle    Colon cancer Cousin        d. 45s; maternal first cousin   Breast cancer Paternal Aunt     CURRENT MEDICATIONS:  Current Outpatient Medications  Medication Sig Dispense Refill   acetaminophen (TYLENOL) 500 MG tablet Take 1,000 mg by mouth every 6 (six) hours as needed for mild pain or headache.     amLODipine (NORVASC) 10 MG tablet Take 1 tablet (10 mg total) by mouth daily. 30 tablet 2   ASHWAGANDHA PO Take 1 tablet by mouth daily.     capecitabine (XELODA) 500 MG tablet Take 4 tablets (2,000 mg total) by mouth 2 (two) times daily after a meal. Take for 14 days, then hold for 7 days. Repeat every 21 days. 112 tablet 3   diphenoxylate-atropine (LOMOTIL) 2.5-0.025 MG tablet Take 1 tablet by mouth 4 (four) times daily as needed for diarrhea or loose stools. Take Lomotil if continuing to have > 2 watery bowel movements despite Imodium. 30 tablet 0   EPINEPHrine 0.3 mg/0.3 mL IJ SOAJ injection Inject 0.3 mg into the muscle as needed for anaphylaxis. 1 each 2   FIBER PO Take 1 tablet by mouth daily.     ibuprofen (ADVIL) 200 MG tablet Take 400 mg by mouth every 6 (six) hours as needed for headache or mild pain.     Lactobacillus (ACIDOPHILUS PO) Take 1 capsule by mouth daily.     lidocaine-prilocaine (EMLA) cream Apply to affected area once 30 g 3   MAGNESIUM PO Take 1 tablet by mouth daily.     metoprolol tartrate (LOPRESSOR) 50 MG tablet Take 1 tablet (50 mg total) by mouth 2 (two) times daily. 60 tablet 2   Multiple Vitamins-Minerals (ZINC PO) Take 1 tablet by mouth daily.     OXALIPLATIN IV Inject into the vein every 21 ( twenty-one) days.     prochlorperazine (COMPAZINE) 10 MG tablet  Take 1 tablet (10 mg total) by mouth every 6 (six) hours as needed for nausea or vomiting. 30 tablet 1   VITAMIN D PO Take 1 tablet by mouth daily.     No current facility-administered medications for this visit.    ALLERGIES:  Allergies  Allergen Reactions   Aleve [Naproxen] Hives    Ok with ibuprofen   Codeine Hives, Nausea And Vomiting and Rash   Latex Rash    PHYSICAL EXAM:  Performance status (ECOG): 0 - Asymptomatic  There were no vitals filed for this visit. Wt Readings from Last 3 Encounters:  05/27/21 216 lb 3.2 oz (98.1 kg)  05/20/21 216 lb (98 kg)  05/13/21 213 lb 13.5 oz (97 kg)   Physical Exam Vitals reviewed.  Constitutional:      Appearance: Normal appearance.  Cardiovascular:     Rate and Rhythm: Normal rate and regular rhythm.     Pulses: Normal pulses.     Heart sounds: Normal heart sounds.  Pulmonary:     Effort: Pulmonary effort is normal.     Breath sounds: Normal breath sounds.  Neurological:     General: No focal deficit present.     Mental Status: She is alert and oriented to person, place, and time.  Psychiatric:        Mood and Affect: Mood normal.        Behavior: Behavior normal.    LABORATORY DATA:  I have reviewed the labs as listed.     Latest Ref Rng & Units 05/27/2021   10:51 AM 05/20/2021    8:23 AM 05/09/2021   10:08 AM  CBC  WBC 4.0 - 10.5 K/uL 8.0   6.6   7.0    Hemoglobin 12.0 - 15.0 g/dL 11.3   11.5   11.6    Hematocrit 36.0 - 46.0 % 33.2   33.4   36.4    Platelets 150 - 400 K/uL 291   218   281        Latest Ref Rng & Units 05/27/2021   10:51 AM 05/20/2021    8:23 AM 05/09/2021   10:08 AM  CMP  Glucose 70 - 99 mg/dL 144   130   102    BUN 6 - 20 mg/dL $Remove'12   24   9    'Buqsizr$ Creatinine 0.44 - 1.00 mg/dL 0.79   0.96   0.77    Sodium 135 - 145 mmol/L 135   136   137    Potassium 3.5 - 5.1 mmol/L 3.8   3.7   4.7    Chloride 98 - 111 mmol/L 105   107   106    CO2 22 - 32 mmol/L $RemoveB'25   24   26    'LvAHdbYY$ Calcium 8.9 - 10.3 mg/dL 8.5   8.6    8.9    Total Protein 6.5 - 8.1 g/dL 6.4   6.6   6.9    Total Bilirubin 0.3 - 1.2 mg/dL 0.4   0.2   0.5    Alkaline Phos 38 - 126 U/L 67   77   68    AST 15 - 41 U/L $Remo'22   16   15    'uiZkY$ ALT 0 - 44 U/L $Remo'23   15   15      'iCAcV$ DIAGNOSTIC IMAGING:  I have independently reviewed the scans and discussed with the patient. DG Chest Port 1 View  Result Date: 05/13/2021 CLINICAL DATA:  Port placement. EXAM: PORTABLE CHEST 1 VIEW COMPARISON:  Chest x-ray dated April 12, 2021. FINDINGS: New left chest wall port catheter with tip near the cavoatrial junction. The heart size and mediastinal contours are within normal limits. Normal pulmonary vascularity. No focal consolidation, pleural effusion, or pneumothorax. Unchanged tiny calcified granuloma in the right upper lobe. No acute osseous abnormality. IMPRESSION: 1. New left chest wall port catheter without complication. Electronically Signed   By: Titus Dubin M.D.   On: 05/13/2021 08:58   DG C-Arm 1-60 Min-No Report  Result Date: 05/13/2021 Fluoroscopy was utilized by the requesting physician.  No radiographic  interpretation.     ASSESSMENT:  Stage IIIb (T3N1B) descending colon cancer: - Presentation with abdominal pain for few weeks to the ER.  History of IBS and had colonoscopy more than 10 years ago.  No weight loss. - CT CAP with contrast (04/12/2021): Obstructive 5 cm distal descending colon neoplasm with associated proximal stercoral colitis with no bowel perforation.  2 indeterminate right upper lobe lung nodules 1 calcified and 1 noncalcified measuring up to 5 mm.  2 approximately 5 mm hyperdense foci along the gallbladder lumen which may represent gallbladder polyp or stone. - Sigmoidoscopy (04/13/2021) fungating infiltrative obstructing large mass found in the descending colon. - Left hemicolectomy (04/15/2021): Moderately differentiated adenocarcinoma, grade 2, 2/35 lymph nodes positive, margins negative, PT3PN1B.  MMR preserved. - Preoperative CEA  (04/12/2021): 2.5. - Adjuvant chemotherapy with 3 months of CapeOx was recommended with close monitoring of right lung nodules on subsequent scans. - CapeOx cycle 1 on 05/20/2021.    Social/family history: - She lives at home with her husband.  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  No chemical exposure.  Quit smoking at age 6. - Maternal aunt had brain tumor.  Mother had breast cancer.  Maternal aunt died of colon cancer in her 3s.  Maternal first cousin died of colon cancer in his 79s.  Maternal uncle had stomach cancer.  Paternal aunt had breast cancer.  Maternal grandfather had lung cancer.   PLAN:  Stage IIIb (T3N1B) descending colon cancer, MMR stable: - She has tolerated her first cycle reasonably well. - She had laryngal pharyngeal dysesthesias lasting about 3 days.  No tingling or numbness reported. - Reviewed labs today which showed normal LFTs and electrolytes.  CBC was grossly normal. - Proceed with cycle 2 today.  Due to her laryngeal/oropharyngeal symptoms, we will increase his infusion time of oxaliplatin to 4 hours with adding Benadryl and Pepcid to premeds. - She does not have Xeloda at this time.  She will start taking Xeloda 4 tablets twice daily 2 weeks on/1 week of as soon as she receives them. - RTC 3 weeks for follow-up.  2.  Diarrhea: - She has 3 watery bowel movements daily, after eating. - She had 1 day of diarrhea after last treatment, up to 8 bowel movements on that day.  Imodium and Lomotil did not help. - I have recommended her to stop taking fiber supplements. - She will take Lomotil 2 tablets in the morning daily and will take 1 tablet after each watery bowel movement.   Orders placed this encounter:  No orders of the defined types were placed in this encounter.    Derek Jack, MD [facility] [phone]   I, Thana Ates, am acting as a scribe for Dr. Derek Jack.  I, Derek Jack MD, have reviewed the above  documentation for accuracy and completeness, and I agree with the above.

## 2021-06-10 NOTE — Patient Instructions (Addendum)
[place] at [facility] Discharge Instructions   You were seen and examined today by Dr. Delton Coombes.  He reviewed the results of your lab work which is normal/stable.   We will proceed with your treatment today.   Xeloda prescription was sent to [facility]. Continue with getting your Xeloda prescription through the [facility] going forward.   Return as scheduled in 3 weeks.    Thank you for choosing [place] at [facility] to provide your oncology and hematology care.  To afford each patient quality time with our provider, please arrive at least 15 minutes before your scheduled appointment time.   If you have a lab appointment with the [facility] please come in thru the Main Entrance and check in at the main information desk.  You need to re-schedule your appointment should you arrive 10 or more minutes late.  We strive to give you quality time with our providers, and arriving late affects you and other patients whose appointments are after yours.  Also, if you no show three or more times for appointments you may be dismissed from the clinic at the providers discretion.     Again, thank you for choosing [facility].  Our hope is that these requests will decrease the amount of time that you wait before being seen by our physicians.       _____________________________________________________________  Should you have questions after your visit to [facility], please contact our office at [phone] and follow the prompts.  Our office hours are 8:00 a.m. and 4:30 p.m. Monday - Friday.  Please note that voicemails left after 4:00 p.m. may not be returned until the following business day.  We are closed weekends and major holidays.  You do have access to a nurse 24-7, just call the main number to the clinic [phone] and do not press any options, hold on the line and a nurse will  answer the phone.    For prescription refill requests, have your pharmacy contact our office and allow 72 hours.    Due to Covid, you will need to wear a mask upon entering the hospital. If you do not have a mask, a mask will be given to you at the Main Entrance upon arrival. For doctor visits, patients may have 1 support person age 7 or older with them. For treatment visits, patients can not have anyone with them due to social distancing guidelines and our immunocompromised population.

## 2021-06-10 NOTE — Progress Notes (Signed)
Patient okay for treatment per Dr. Delton Coombes, Alison Ramsey will be increased to 4 hours with additional pre-medications d/t cold sensitivity. Patient tolerated chemotherapy with no complaints voiced. Side effects with management reviewed understanding verbalized. Port site clean and dry with no bruising or swelling noted at site. Good blood return noted before and after administration of chemotherapy. Band aid applied. Patient left in satisfactory condition with VSS and no s/s of distress noted.

## 2021-06-11 LAB — CEA: CEA: 2.7 ng/mL (ref 0.0–4.7)

## 2021-07-01 ENCOUNTER — Inpatient Hospital Stay (HOSPITAL_BASED_OUTPATIENT_CLINIC_OR_DEPARTMENT_OTHER): Payer: Self-pay | Admitting: Hematology

## 2021-07-01 ENCOUNTER — Inpatient Hospital Stay (HOSPITAL_COMMUNITY): Payer: Self-pay

## 2021-07-01 VITALS — BP 151/90 | HR 86 | Temp 98.8°F | Resp 18

## 2021-07-01 VITALS — BP 135/85 | HR 86 | Temp 98.4°F | Resp 20

## 2021-07-01 DIAGNOSIS — C186 Malignant neoplasm of descending colon: Secondary | ICD-10-CM

## 2021-07-01 DIAGNOSIS — D500 Iron deficiency anemia secondary to blood loss (chronic): Secondary | ICD-10-CM

## 2021-07-01 LAB — CBC WITH DIFFERENTIAL/PLATELET
Abs Immature Granulocytes: 0.06 10*3/uL (ref 0.00–0.07)
Basophils Absolute: 0 10*3/uL (ref 0.0–0.1)
Basophils Relative: 1 %
Eosinophils Absolute: 0.1 10*3/uL (ref 0.0–0.5)
Eosinophils Relative: 2 %
HCT: 31.1 % — ABNORMAL LOW (ref 36.0–46.0)
Hemoglobin: 10.5 g/dL — ABNORMAL LOW (ref 12.0–15.0)
Immature Granulocytes: 1 %
Lymphocytes Relative: 22 %
Lymphs Abs: 1.3 10*3/uL (ref 0.7–4.0)
MCH: 29.2 pg (ref 26.0–34.0)
MCHC: 33.8 g/dL (ref 30.0–36.0)
MCV: 86.4 fL (ref 80.0–100.0)
Monocytes Absolute: 0.7 10*3/uL (ref 0.1–1.0)
Monocytes Relative: 12 %
Neutro Abs: 3.9 10*3/uL (ref 1.7–7.7)
Neutrophils Relative %: 62 %
Platelets: 174 10*3/uL (ref 150–400)
RBC: 3.6 MIL/uL — ABNORMAL LOW (ref 3.87–5.11)
RDW: 21.7 % — ABNORMAL HIGH (ref 11.5–15.5)
WBC: 6.1 10*3/uL (ref 4.0–10.5)
nRBC: 0.3 % — ABNORMAL HIGH (ref 0.0–0.2)

## 2021-07-01 LAB — COMPREHENSIVE METABOLIC PANEL
ALT: 32 U/L (ref 0–44)
AST: 30 U/L (ref 15–41)
Albumin: 3.6 g/dL (ref 3.5–5.0)
Alkaline Phosphatase: 75 U/L (ref 38–126)
Anion gap: 7 (ref 5–15)
BUN: 17 mg/dL (ref 6–20)
CO2: 24 mmol/L (ref 22–32)
Calcium: 8.8 mg/dL — ABNORMAL LOW (ref 8.9–10.3)
Chloride: 104 mmol/L (ref 98–111)
Creatinine, Ser: 0.89 mg/dL (ref 0.44–1.00)
GFR, Estimated: >60 mL/min (ref >60)
Glucose, Bld: 130 mg/dL — ABNORMAL HIGH (ref 70–99)
Potassium: 3.9 mmol/L (ref 3.5–5.1)
Sodium: 135 mmol/L (ref 135–145)
Total Bilirubin: 1 mg/dL (ref 0.3–1.2)
Total Protein: 6.5 g/dL (ref 6.5–8.1)

## 2021-07-01 LAB — MAGNESIUM: Magnesium: 1.9 mg/dL (ref 1.7–2.4)

## 2021-07-01 MED ORDER — SODIUM CHLORIDE 0.9% FLUSH
10.0000 mL | INTRAVENOUS | Status: DC | PRN
  Administered 2021-07-01: 10 mL

## 2021-07-01 MED ORDER — DEXTROSE 5 % IV SOLN: Freq: Once | INTRAVENOUS | Status: AC

## 2021-07-01 MED ORDER — HEPARIN SOD (PORK) LOCK FLUSH 100 UNIT/ML IV SOLN
500.0000 [IU] | Freq: Once | INTRAVENOUS | Status: AC | PRN
  Administered 2021-07-01: 500 [IU]

## 2021-07-01 MED ORDER — PALONOSETRON HCL INJECTION 0.25 MG/5ML
0.2500 mg | Freq: Once | INTRAVENOUS | Status: AC
  Administered 2021-07-01: 0.25 mg via INTRAVENOUS
  Filled 2021-07-01: qty 5

## 2021-07-01 MED ORDER — SODIUM CHLORIDE 0.9 % IV SOLN
10.0000 mg | Freq: Once | INTRAVENOUS | Status: AC
  Administered 2021-07-01: 10 mg via INTRAVENOUS
  Filled 2021-07-01: qty 10

## 2021-07-01 MED ORDER — DIPHENHYDRAMINE HCL 25 MG PO CAPS
25.0000 mg | ORAL_CAPSULE | Freq: Once | ORAL | Status: AC
  Administered 2021-07-01: 25 mg via ORAL
  Filled 2021-07-01: qty 1

## 2021-07-01 MED ORDER — FAMOTIDINE IN NACL 20-0.9 MG/50ML-% IV SOLN
20.0000 mg | Freq: Once | INTRAVENOUS | Status: AC
  Administered 2021-07-01: 20 mg via INTRAVENOUS
  Filled 2021-07-01: qty 50

## 2021-07-01 MED ORDER — OXALIPLATIN CHEMO INJECTION 100 MG/20ML
130.0000 mg/m2 | Freq: Once | INTRAVENOUS | Status: AC
  Administered 2021-07-01: 275 mg via INTRAVENOUS
  Filled 2021-07-01: qty 55

## 2021-07-01 NOTE — Progress Notes (Signed)
Patient is taking Xeloda as prescribed.  She has not missed any doses and reports no side effects at this time other than the bottom of her feet feels like blistered.

## 2021-07-01 NOTE — Progress Notes (Signed)
Please use the genetics order from 05/28/2021 for the blood draw on 07/03/2021.

## 2021-07-03 ENCOUNTER — Inpatient Hospital Stay: Payer: Self-pay | Attending: Genetic Counselor | Admitting: Genetic Counselor

## 2021-07-03 ENCOUNTER — Inpatient Hospital Stay: Payer: Self-pay

## 2021-07-22 ENCOUNTER — Inpatient Hospital Stay (HOSPITAL_COMMUNITY): Payer: Self-pay

## 2021-07-22 ENCOUNTER — Inpatient Hospital Stay (HOSPITAL_COMMUNITY): Payer: Self-pay | Attending: Hematology

## 2021-07-22 ENCOUNTER — Inpatient Hospital Stay (HOSPITAL_BASED_OUTPATIENT_CLINIC_OR_DEPARTMENT_OTHER): Payer: Self-pay | Admitting: Hematology

## 2021-07-22 VITALS — BP 162/92 | HR 84 | Temp 98.1°F | Resp 20

## 2021-07-22 DIAGNOSIS — D509 Iron deficiency anemia, unspecified: Secondary | ICD-10-CM

## 2021-07-22 DIAGNOSIS — Z5111 Encounter for antineoplastic chemotherapy: Secondary | ICD-10-CM | POA: Insufficient documentation

## 2021-07-22 DIAGNOSIS — C182 Malignant neoplasm of ascending colon: Secondary | ICD-10-CM

## 2021-07-22 DIAGNOSIS — C186 Malignant neoplasm of descending colon: Secondary | ICD-10-CM | POA: Insufficient documentation

## 2021-07-22 DIAGNOSIS — D500 Iron deficiency anemia secondary to blood loss (chronic): Secondary | ICD-10-CM

## 2021-07-22 LAB — COMPREHENSIVE METABOLIC PANEL
ALT: 33 U/L (ref 0–44)
AST: 31 U/L (ref 15–41)
Albumin: 3.8 g/dL (ref 3.5–5.0)
Alkaline Phosphatase: 80 U/L (ref 38–126)
Anion gap: 7 (ref 5–15)
BUN: 14 mg/dL (ref 6–20)
CO2: 25 mmol/L (ref 22–32)
Calcium: 8.8 mg/dL — ABNORMAL LOW (ref 8.9–10.3)
Chloride: 107 mmol/L (ref 98–111)
Creatinine, Ser: 0.81 mg/dL (ref 0.44–1.00)
GFR, Estimated: >60 mL/min (ref >60)
Glucose, Bld: 147 mg/dL — ABNORMAL HIGH (ref 70–99)
Potassium: 3.8 mmol/L (ref 3.5–5.1)
Sodium: 139 mmol/L (ref 135–145)
Total Bilirubin: 1 mg/dL (ref 0.3–1.2)
Total Protein: 6.7 g/dL (ref 6.5–8.1)

## 2021-07-22 LAB — CBC WITH DIFFERENTIAL/PLATELET
Abs Immature Granulocytes: 0.1 10*3/uL — ABNORMAL HIGH (ref 0.00–0.07)
Band Neutrophils: 5 %
Basophils Absolute: 0 10*3/uL (ref 0.0–0.1)
Basophils Relative: 0 %
Eosinophils Absolute: 0 10*3/uL (ref 0.0–0.5)
Eosinophils Relative: 0 %
HCT: 30.8 % — ABNORMAL LOW (ref 36.0–46.0)
Hemoglobin: 10.7 g/dL — ABNORMAL LOW (ref 12.0–15.0)
Lymphocytes Relative: 14 %
Lymphs Abs: 0.7 10*3/uL (ref 0.7–4.0)
MCH: 30.9 pg (ref 26.0–34.0)
MCHC: 34.7 g/dL (ref 30.0–36.0)
MCV: 89 fL (ref 80.0–100.0)
Metamyelocytes Relative: 2 %
Monocytes Absolute: 0.4 10*3/uL (ref 0.1–1.0)
Monocytes Relative: 9 %
Neutro Abs: 3.7 10*3/uL (ref 1.7–7.7)
Neutrophils Relative %: 70 %
Platelets: 148 10*3/uL — ABNORMAL LOW (ref 150–400)
RBC: 3.46 MIL/uL — ABNORMAL LOW (ref 3.87–5.11)
RDW: 23.1 % — ABNORMAL HIGH (ref 11.5–15.5)
WBC: 4.9 10*3/uL (ref 4.0–10.5)
nRBC: 0 % (ref 0.0–0.2)
nRBC: 3 /100 WBC — ABNORMAL HIGH

## 2021-07-22 LAB — IRON AND TIBC
Iron: 71 ug/dL (ref 28–170)
Saturation Ratios: 15 % (ref 10.4–31.8)
TIBC: 464 ug/dL — ABNORMAL HIGH (ref 250–450)
UIBC: 393 ug/dL

## 2021-07-22 LAB — FERRITIN: Ferritin: 24 ng/mL (ref 11–307)

## 2021-07-22 LAB — MAGNESIUM: Magnesium: 2 mg/dL (ref 1.7–2.4)

## 2021-07-22 MED ORDER — SODIUM CHLORIDE 0.9 % IV SOLN
10.0000 mg | Freq: Once | INTRAVENOUS | Status: AC
  Administered 2021-07-22: 10 mg via INTRAVENOUS
  Filled 2021-07-22: qty 1

## 2021-07-22 MED ORDER — HEPARIN SOD (PORK) LOCK FLUSH 100 UNIT/ML IV SOLN
500.0000 [IU] | Freq: Once | INTRAVENOUS | Status: AC | PRN
  Administered 2021-07-22: 500 [IU]

## 2021-07-22 MED ORDER — SODIUM CHLORIDE 0.9% FLUSH
10.0000 mL | INTRAVENOUS | Status: DC | PRN
  Administered 2021-07-22: 10 mL

## 2021-07-22 MED ORDER — SODIUM CHLORIDE 0.9 % IV SOLN: Freq: Once | INTRAVENOUS | Status: AC

## 2021-07-22 MED ORDER — FAMOTIDINE IN NACL 20-0.9 MG/50ML-% IV SOLN
20.0000 mg | Freq: Once | INTRAVENOUS | Status: AC
  Administered 2021-07-22: 20 mg via INTRAVENOUS
  Filled 2021-07-22: qty 50

## 2021-07-22 MED ORDER — PALONOSETRON HCL INJECTION 0.25 MG/5ML
0.2500 mg | Freq: Once | INTRAVENOUS | Status: AC
  Administered 2021-07-22: 0.25 mg via INTRAVENOUS
  Filled 2021-07-22: qty 5

## 2021-07-22 MED ORDER — OXALIPLATIN CHEMO INJECTION 100 MG/20ML
130.0000 mg/m2 | Freq: Once | INTRAVENOUS | Status: AC
  Administered 2021-07-22: 275 mg via INTRAVENOUS
  Filled 2021-07-22: qty 55

## 2021-07-22 MED ORDER — DIPHENHYDRAMINE HCL 25 MG PO CAPS
25.0000 mg | ORAL_CAPSULE | Freq: Once | ORAL | Status: AC
  Administered 2021-07-22: 25 mg via ORAL
  Filled 2021-07-22: qty 1

## 2021-07-22 MED ORDER — ACETAMINOPHEN 325 MG PO TABS
650.0000 mg | ORAL_TABLET | Freq: Once | ORAL | Status: AC
  Administered 2021-07-22: 650 mg via ORAL
  Filled 2021-07-22: qty 2

## 2021-07-22 MED ORDER — DEXTROSE 5 % IV SOLN: Freq: Once | INTRAVENOUS | Status: AC

## 2021-07-22 MED ORDER — SODIUM CHLORIDE 0.9 % IV SOLN
510.0000 mg | Freq: Once | INTRAVENOUS | Status: AC
  Administered 2021-07-22: 510 mg via INTRAVENOUS
  Filled 2021-07-22: qty 17

## 2021-07-22 NOTE — Patient Instructions (Signed)
[place] at [facility] Discharge Instructions   You were seen and examined today by Dr. Delton Coombes.  He reviewed your lab work which is normal/stable. You iron has not increased with taking iron pills. We will plan to give you IV iron in the clinic today.   We will proceed with your final chemotherapy infusion today.  Return as scheduled in 3 months.    Thank you for choosing [place] at [facility] to provide your oncology and hematology care.  To afford each patient quality time with our provider, please arrive at least 15 minutes before your scheduled appointment time.   If you have a lab appointment with the [facility] please come in thru the Main Entrance and check in at the main information desk.  You need to re-schedule your appointment should you arrive 10 or more minutes late.  We strive to give you quality time with our providers, and arriving late affects you and other patients whose appointments are after yours.  Also, if you no show three or more times for appointments you may be dismissed from the clinic at the providers discretion.     Again, thank you for choosing [facility].  Our hope is that these requests will decrease the amount of time that you wait before being seen by our physicians.       _____________________________________________________________  Should you have questions after your visit to [facility], please contact our office at [phone] and follow the prompts.  Our office hours are 8:00 a.m. and 4:30 p.m. Monday - Friday.  Please note that voicemails left after 4:00 p.m. may not be returned until the following business day.  We are closed weekends and major holidays.  You do have access to a nurse 24-7, just call the main number to the clinic [phone] and do not press any options, hold on the line and a nurse will answer the phone.    For prescription refill requests,  have your pharmacy contact our office and allow 72 hours.    Due to Covid, you will need to wear a mask upon entering the hospital. If you do not have a mask, a mask will be given to you at the Main Entrance upon arrival. For doctor visits, patients may have 1 support person age 40 or older with them. For treatment visits, patients can not have anyone with them due to social distancing guidelines and our immunocompromised population.

## 2021-07-22 NOTE — Progress Notes (Signed)
Alison Ramsey, [place] [id number]   CLINIC:  Medical Oncology/Hematology  PCP:  Sharion Balloon, [address] / [place] [address] [phone]   REASON FOR VISIT:  Follow-up for descending colon cancer  PRIOR THERAPY: Left hemicolectomy (04/15/2021): Moderately differentiated adenocarcinoma, grade 2, 2/35 lymph nodes positive, margins negative, PT3PN1B.  MMR preserved.  NGS Results: not done  CURRENT THERAPY: Xelox (Capeox) q21d  BRIEF ONCOLOGIC HISTORY:  Oncology History  Colon cancer ([place])  05/09/2021 Initial Diagnosis   Colon cancer ([place])   05/20/2021 -  Chemotherapy   Patient is on Treatment Plan : COLORECTAL Xelox (Capeox) q21d       CANCER STAGING:  Cancer Staging  Colon cancer [facility]) Staging form: Colon and Rectum, AJCC 8th Edition - Clinical stage from 05/09/2021: Stage IIIB (cT3, cN1b, cM0) - Unsigned   INTERVAL HISTORY:  [name], a 51 y.o. female, returns for routine follow-up and consideration for next cycle of chemotherapy. Alison Ramsey was last seen on 07/01/2021.  Due for cycle #4 of Xelox today.   Overall, she tells me she has been feeling pretty well. She reports she is unable to swallow water unless it is hot. She reports she is drinking 80 ounces of water daily, and she is only drinking 20 ounces of water daily for the week following her treatment. Her diarrhea is well controlled with Imodium. She has not been taking iron tablets. She denies peeling on her hands and feet, mouth sores, and tingling/numbness. She reports cold sensitivity lasting 1 week after treatment.   Overall, she feels ready for next cycle of chemo today.    REVIEW OF SYSTEMS:  Review of Systems  Constitutional:  Negative for appetite change and fatigue.  HENT:   Positive for trouble swallowing. Negative for mouth sores.   Cardiovascular:  Positive for palpitations.  Gastrointestinal:  Positive for diarrhea and nausea.   Neurological:  Positive for dizziness and headaches. Negative for numbness.  Psychiatric/Behavioral:  Positive for depression. The patient is nervous/anxious.   All other systems reviewed and are negative.   PAST MEDICAL/SURGICAL HISTORY:  Past Medical History:  Diagnosis Date   Cancer ([place])    Hypertension    Lyme disease 2018   Past Surgical History:  Procedure Laterality Date   ABDOMINAL HYSTERECTOMY  2013   BIOPSY  04/13/2021   Procedure: BIOPSY;  Surgeon: [name], DO;  Location: [facility];  Service: Endoscopy;;  descending colon mass   FLEXIBLE SIGMOIDOSCOPY N/A 04/13/2021   Procedure: FLEXIBLE SIGMOIDOSCOPY;  Surgeon: [name], DO;  Location: [facility];  Service: Endoscopy;  Laterality: N/A;   PARTIAL COLECTOMY N/A 04/15/2021   Procedure: EXTENDED RIGHT HEMICOLECTOMY;  Surgeon: Aviva Signs, MD;  Location: [facility] ORS;  Service: General;  Laterality: N/A;   PORTACATH PLACEMENT Left 05/13/2021   Procedure: INSERTION PORT-A-CATH;  Surgeon: Aviva Signs, MD;  Location: [facility];  Service: General;  Laterality: Left;   WISDOM TOOTH EXTRACTION      SOCIAL HISTORY:  Social History   Socioeconomic History   Marital status: Married    Spouse name: Not on file   Number of children: Not on file   Years of education: Not on file   Highest education level: Not on file  Occupational History   Not on file  Tobacco Use   Smoking status: Never   Smokeless tobacco: Never  Vaping Use   Vaping Use: Never used  Substance and  Sexual Activity   Alcohol use: Yes    Comment: occasionally   Drug use: Yes    Types: Marijuana   Sexual activity: Yes  Other Topics Concern   Not on file  Social History Narrative   Not on file   Social Determinants of Health   Financial Resource Strain: Not on file  Food Insecurity: Not on file  Transportation Needs: Not on file  Physical Activity: Not on file  Stress: Not on file  Social Connections: Not on file  Intimate Partner  Violence: Not on file    FAMILY HISTORY:  Family History  Problem Relation Age of Onset   Kidney disease Mother    Hypertension Mother    Breast cancer Mother    Stroke Father    Colon cancer Maternal Aunt        d. 69s   Other Maternal Aunt        pituitary cancer   Stomach cancer Maternal Uncle    Colon cancer Cousin        d. 59s; maternal first cousin   Breast cancer Paternal Aunt     CURRENT MEDICATIONS:  Current Outpatient Medications  Medication Sig Dispense Refill   acetaminophen (TYLENOL) 500 MG tablet Take 1,000 mg by mouth every 6 (six) hours as needed for mild pain or headache.     amLODipine (NORVASC) 10 MG tablet Take 1 tablet (10 mg total) by mouth daily. 30 tablet 2   ASHWAGANDHA PO Take 1 tablet by mouth daily.     BLACK CURRANT SEED OIL PO Take by mouth.     capecitabine (XELODA) 500 MG tablet Take 4 tablets (2,000 mg total) by mouth 2 (two) times daily after a meal. Take for 14 days, then hold for 7 days. Repeat every 21 days. 112 tablet 3   diphenoxylate-atropine (LOMOTIL) 2.5-0.025 MG tablet Take 1 tablet by mouth 4 (four) times daily as needed for diarrhea or loose stools. Take Lomotil if continuing to have > 2 watery bowel movements despite Imodium. 30 tablet 0   EPINEPHrine 0.3 mg/0.3 mL IJ SOAJ injection Inject 0.3 mg into the muscle as needed for anaphylaxis. 1 each 2   FIBER PO Take 1 tablet by mouth daily.     ibuprofen (ADVIL) 200 MG tablet Take 400 mg by mouth every 6 (six) hours as needed for headache or mild pain.     Lactobacillus (ACIDOPHILUS PO) Take 1 capsule by mouth daily.     lidocaine-prilocaine (EMLA) cream Apply to affected area once (Patient not taking: Reported on 07/01/2021) 30 g 3   MAGNESIUM PO Take 1 tablet by mouth daily.     metoprolol tartrate (LOPRESSOR) 50 MG tablet Take 1 tablet (50 mg total) by mouth 2 (two) times daily. 60 tablet 2   Multiple Vitamins-Minerals (ZINC PO) Take 1 tablet by mouth daily.     OXALIPLATIN IV Inject  into the vein every 21 ( twenty-one) days.     prochlorperazine (COMPAZINE) 10 MG tablet Take 1 tablet (10 mg total) by mouth every 6 (six) hours as needed for nausea or vomiting. (Patient not taking: Reported on 07/01/2021) 30 tablet 1   SELENIUM PO Take by mouth.     VITAMIN D PO Take 1 tablet by mouth daily.     No current facility-administered medications for this visit.    ALLERGIES:  Allergies  Allergen Reactions   Aleve [Naproxen] Hives    Ok with ibuprofen   Codeine Hives, Nausea And Vomiting and  Rash   Latex Rash    PHYSICAL EXAM:  Performance status (ECOG): 0 - Asymptomatic  There were no vitals filed for this visit. Wt Readings from Last 3 Encounters:  07/22/21 226 lb 12.8 oz (102.9 kg)  06/10/21 223 lb 1.7 oz (101.2 kg)  05/27/21 216 lb 3.2 oz (98.1 kg)   Physical Exam Vitals reviewed.  Constitutional:      Appearance: Normal appearance. She is obese.  Cardiovascular:     Rate and Rhythm: Normal rate and regular rhythm.     Pulses: Normal pulses.     Heart sounds: Normal heart sounds.  Pulmonary:     Effort: Pulmonary effort is normal.     Breath sounds: Normal breath sounds.  Neurological:     General: No focal deficit present.     Mental Status: She is alert and oriented to person, place, and time.  Psychiatric:        Mood and Affect: Mood normal.        Behavior: Behavior normal.     LABORATORY DATA:  I have reviewed the labs as listed.     Latest Ref Rng & Units 07/01/2021    8:31 AM 06/10/2021    9:33 AM 05/27/2021   10:51 AM  CBC  WBC 4.0 - 10.5 K/uL 6.1  6.6  8.0   Hemoglobin 12.0 - 15.0 g/dL 10.5  11.7  11.3   Hematocrit 36.0 - 46.0 % 31.1  34.3  33.2   Platelets 150 - 400 K/uL 174  274  291       Latest Ref Rng & Units 07/01/2021    8:31 AM 06/10/2021    9:33 AM 05/27/2021   10:51 AM  CMP  Glucose 70 - 99 mg/dL 130  142  144   BUN 6 - 20 mg/dL $Remove'17  13  12   'UjQBzGU$ Creatinine 0.44 - 1.00 mg/dL 0.89  0.86  0.79   Sodium 135 - 145 mmol/L 135  136   135   Potassium 3.5 - 5.1 mmol/L 3.9  3.7  3.8   Chloride 98 - 111 mmol/L 104  107  105   CO2 22 - 32 mmol/L $RemoveB'24  25  25   'IcRFIKbg$ Calcium 8.9 - 10.3 mg/dL 8.8  8.8  8.5   Total Protein 6.5 - 8.1 g/dL 6.5  6.9  6.4   Total Bilirubin 0.3 - 1.2 mg/dL 1.0  0.6  0.4   Alkaline Phos 38 - 126 U/L 75  83  67   AST 15 - 41 U/L $Remo'30  30  22   'ldsWS$ ALT 0 - 44 U/L 32  34  23     DIAGNOSTIC IMAGING:  I have independently reviewed the scans and discussed with the patient. No results found.   ASSESSMENT:  Stage IIIb (T3N1B) descending colon cancer: - Presentation with abdominal pain for few weeks to the ER.  History of IBS and had colonoscopy more than 10 years ago.  No weight loss. - CT CAP with contrast (04/12/2021): Obstructive 5 cm distal descending colon neoplasm with associated proximal stercoral colitis with no bowel perforation.  2 indeterminate right upper lobe lung nodules 1 calcified and 1 noncalcified measuring up to 5 mm.  2 approximately 5 mm hyperdense foci along the gallbladder lumen which may represent gallbladder polyp or stone. - Sigmoidoscopy (04/13/2021) fungating infiltrative obstructing large mass found in the descending colon. - Left hemicolectomy (04/15/2021): Moderately differentiated adenocarcinoma, grade 2, 2/35 lymph nodes positive, margins negative, PT3PN1B.  MMR preserved. - Preoperative CEA (04/12/2021): 2.5. - Adjuvant chemotherapy with 3 months of CapeOx was recommended with close monitoring of right lung nodules on subsequent scans. - CapeOx cycle 1 on 05/20/2021.    Social/family history: - She lives at home with her husband.  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  No chemical exposure.  Quit smoking at age 3. - Maternal aunt had brain tumor.  Mother had breast cancer.  Maternal aunt died of colon cancer in her 68s.  Maternal first cousin died of colon cancer in his 4s.  Maternal uncle had stomach cancer.  Paternal aunt had breast cancer.  Maternal grandfather had lung  cancer.   PLAN:  Stage IIIb (T3N1B) descending colon cancer, MMR stable: - She does not report any laryngal pharyngeal dysesthesias after last treatment. - She had cold sensitivity and was only able to drink hot water for few days. - We will proceed with cycle 4 of oxaliplatin today over 4 hours. - She will start Xeloda 4 tablets twice daily, 2 weeks on/1 week off last cycle. - Reviewed labs today which showed normal LFTs and CBC.  CEA was 2.7. - I have recommended follow-up in 3 months with CEA level, CT abdomen and pelvis and routine labs. - I have recommended follow-up with GI for completion colonoscopy.  2.  Diarrhea: - Continue Imodium as needed which is helping.  3.  Normocytic anemia: - Continue iron tablet daily.  Ferritin is 24 and hemoglobin is 10.7.   Orders placed this encounter:  No orders of the defined types were placed in this encounter.    Derek Jack, MD Alison Ramsey [phone]   I, Thana Ates, am acting as a scribe for Dr. Derek Jack.  I, Derek Jack MD, have reviewed the above documentation for accuracy and completeness, and I agree with the above.

## 2021-07-22 NOTE — Progress Notes (Signed)
Patient presents today for Oxaliplatin infusion per providers order.  Vital signs within parameters for treatment.  Labs pending.  Patient has no new complaints at this time.  Patient to receive Feraheme infusion after chemo therapy today.  Oxaliplatin and Feraheme given today per MD orders.  Stable during infusion without adverse affects.  Patient did not stay the full thirty minute post iron infusion wait time and was advised to go to the emergency department if she has chest pain, SOB, swelling, hives,or experiences any other severe reaction symptom.  Vital signs stable.  BP elevated, patient due to take BP when she gets home.   No complaints at this time.  Discharge from clinic ambulatory in stable condition.  Alert and oriented X 3.  Follow up with [facility] as scheduled.

## 2021-07-22 NOTE — Patient Instructions (Signed)
[facility]  Discharge Instructions: Thank you for choosing [facility] to provide your oncology and hematology care.  If you have a lab appointment with the [facility], please come in thru the Main Entrance and check in at the main information desk.  Wear comfortable clothing and clothing appropriate for easy access to any Portacath or PICC line.   We strive to give you quality time with your provider. You may need to reschedule your appointment if you arrive late (15 or more minutes).  Arriving late affects you and other patients whose appointments are after yours.  Also, if you miss three or more appointments without notifying the office, you may be dismissed from the clinic at the provider's discretion.      For prescription refill requests, have your pharmacy contact our office and allow 72 hours for refills to be completed.    Today you received the following chemotherapy and/or immunotherapy agents Oxaliplain and Feraheme      To help prevent nausea and vomiting after your treatment, we encourage you to take your nausea medication as directed.  BELOW ARE SYMPTOMS THAT SHOULD BE REPORTED IMMEDIATELY: *FEVER GREATER THAN 100.4 F (38 C) OR HIGHER *CHILLS OR SWEATING *NAUSEA AND VOMITING THAT IS NOT CONTROLLED WITH YOUR NAUSEA MEDICATION *UNUSUAL SHORTNESS OF BREATH *UNUSUAL BRUISING OR BLEEDING *URINARY PROBLEMS (pain or burning when urinating, or frequent urination) *BOWEL PROBLEMS (unusual diarrhea, constipation, pain near the anus) TENDERNESS IN MOUTH AND THROAT WITH OR WITHOUT PRESENCE OF ULCERS (sore throat, sores in mouth, or a toothache) UNUSUAL RASH, SWELLING OR PAIN  UNUSUAL VAGINAL DISCHARGE OR ITCHING   Items with * indicate a potential emergency and should be followed up as soon as possible or go to the Emergency Department if any problems should occur.  Please show the CHEMOTHERAPY ALERT CARD or IMMUNOTHERAPY ALERT CARD at check-in to the  Emergency Department and triage nurse.  Should you have questions after your visit or need to cancel or reschedule your appointment, please contact [facility] [phone]  and follow the prompts.  Office hours are 8:00 a.m. to 4:30 p.m. Monday - Friday. Please note that voicemails left after 4:00 p.m. may not be returned until the following business day.  We are closed weekends and major holidays. You have access to a nurse at all times for urgent questions. Please call the main number to the clinic [phone] and follow the prompts.  For any non-urgent questions, you may also contact your provider using MyChart. We now offer e-Visits for anyone 37 and older to request care online for non-urgent symptoms. For details visit [email or web address].   Also download the MyChart app! Go to the app store, search "MyChart", open the app, select [facility], and log in with your MyChart username and password.  Masks are optional in the cancer centers. If you would like for your care team to wear a mask while they are taking care of you, please let them know. For doctor visits, patients may have with them one support person who is at least 51 years old. At this time, visitors are not allowed in the infusion area.

## 2021-07-23 LAB — CEA: CEA: 3.8 ng/mL (ref 0.0–4.7)

## 2021-07-29 ENCOUNTER — Other Ambulatory Visit: Payer: Self-pay

## 2021-08-06 ENCOUNTER — Other Ambulatory Visit: Payer: Self-pay

## 2021-09-03 ENCOUNTER — Other Ambulatory Visit: Payer: Self-pay | Admitting: Genetic Counselor

## 2021-09-04 ENCOUNTER — Inpatient Hospital Stay: Payer: Self-pay | Attending: Genetic Counselor | Admitting: Genetic Counselor

## 2021-09-04 ENCOUNTER — Inpatient Hospital Stay: Payer: Self-pay

## 2021-09-09 ENCOUNTER — Other Ambulatory Visit: Payer: Self-pay | Admitting: Hematology

## 2021-10-22 ENCOUNTER — Ambulatory Visit (HOSPITAL_COMMUNITY)
Admission: RE | Admit: 2021-10-22 | Discharge: 2021-10-22 | Disposition: A | Discharge status: Home / Self Care | Payer: Self-pay | Source: Ambulatory Visit | Attending: Hematology | Admitting: Hematology

## 2021-10-22 ENCOUNTER — Inpatient Hospital Stay: Payer: Self-pay | Attending: Genetic Counselor

## 2021-10-22 DIAGNOSIS — D649 Anemia, unspecified: Secondary | ICD-10-CM | POA: Insufficient documentation

## 2021-10-22 DIAGNOSIS — C186 Malignant neoplasm of descending colon: Secondary | ICD-10-CM | POA: Insufficient documentation

## 2021-10-22 DIAGNOSIS — C182 Malignant neoplasm of ascending colon: Secondary | ICD-10-CM

## 2021-10-22 DIAGNOSIS — D509 Iron deficiency anemia, unspecified: Secondary | ICD-10-CM

## 2021-10-22 LAB — CBC WITH DIFFERENTIAL/PLATELET
Abs Immature Granulocytes: 0.04 10*3/uL (ref 0.00–0.07)
Basophils Absolute: 0 10*3/uL (ref 0.0–0.1)
Basophils Relative: 1 %
Eosinophils Absolute: 0.1 10*3/uL (ref 0.0–0.5)
Eosinophils Relative: 2 %
HCT: 36.2 % (ref 36.0–46.0)
Hemoglobin: 12.9 g/dL (ref 12.0–15.0)
Immature Granulocytes: 1 %
Lymphocytes Relative: 20 %
Lymphs Abs: 1.4 10*3/uL (ref 0.7–4.0)
MCH: 30.8 pg (ref 26.0–34.0)
MCHC: 35.6 g/dL (ref 30.0–36.0)
MCV: 86.4 fL (ref 80.0–100.0)
Monocytes Absolute: 0.4 10*3/uL (ref 0.1–1.0)
Monocytes Relative: 5 %
Neutro Abs: 5.1 10*3/uL (ref 1.7–7.7)
Neutrophils Relative %: 71 %
Platelets: 213 10*3/uL (ref 150–400)
RBC: 4.19 MIL/uL (ref 3.87–5.11)
RDW: 14 % (ref 11.5–15.5)
WBC: 7.1 10*3/uL (ref 4.0–10.5)
nRBC: 0 % (ref 0.0–0.2)

## 2021-10-22 LAB — COMPREHENSIVE METABOLIC PANEL
ALT: 33 U/L (ref 0–44)
AST: 26 U/L (ref 15–41)
Albumin: 4.1 g/dL (ref 3.5–5.0)
Alkaline Phosphatase: 84 U/L (ref 38–126)
Anion gap: 8 (ref 5–15)
BUN: 14 mg/dL (ref 6–20)
CO2: 24 mmol/L (ref 22–32)
Calcium: 9.1 mg/dL (ref 8.9–10.3)
Chloride: 106 mmol/L (ref 98–111)
Creatinine, Ser: 0.83 mg/dL (ref 0.44–1.00)
GFR, Estimated: >60 mL/min (ref >60)
Glucose, Bld: 140 mg/dL — ABNORMAL HIGH (ref 70–99)
Potassium: 3.9 mmol/L (ref 3.5–5.1)
Sodium: 138 mmol/L (ref 135–145)
Total Bilirubin: 0.9 mg/dL (ref 0.3–1.2)
Total Protein: 7.1 g/dL (ref 6.5–8.1)

## 2021-10-22 LAB — IRON AND TIBC
Iron: 67 ug/dL (ref 28–170)
Saturation Ratios: 19 % (ref 10.4–31.8)
TIBC: 358 ug/dL (ref 250–450)
UIBC: 291 ug/dL

## 2021-10-22 LAB — FERRITIN: Ferritin: 47 ng/mL (ref 11–307)

## 2021-10-22 LAB — MAGNESIUM: Magnesium: 2.1 mg/dL (ref 1.7–2.4)

## 2021-10-22 MED ORDER — HEPARIN SOD (PORK) LOCK FLUSH 100 UNIT/ML IV SOLN
INTRAVENOUS | Status: AC
  Administered 2021-10-22: 500 [IU] via INTRAVENOUS
  Filled 2021-10-22: qty 5

## 2021-10-22 MED ORDER — HEPARIN SOD (PORK) LOCK FLUSH 100 UNIT/ML IV SOLN: 500.0000 [IU] | Freq: Once | INTRAVENOUS | Status: AC

## 2021-10-22 MED ORDER — IOHEXOL 9 MG/ML PO SOLN
ORAL | Status: AC
  Filled 2021-10-22: qty 1000

## 2021-10-22 MED ORDER — IOHEXOL 300 MG/ML  SOLN
100.0000 mL | Freq: Once | INTRAMUSCULAR | Status: AC | PRN
  Administered 2021-10-22: 100 mL via INTRAVENOUS

## 2021-10-22 NOTE — Progress Notes (Signed)
Pt presents today for port flush and labs for CT scan at [facility]. Pt flushed easily with good blood return. No bruising or swelling noted at the site. Pt remained accessed for CT scan.  Discharged from clinic ambulatory in stable condition. Alert and oriented x 3. F/U with [facility] as scheduled.

## 2021-10-24 LAB — CEA: CEA: 2.3 ng/mL (ref 0.0–4.7)

## 2021-10-29 ENCOUNTER — Inpatient Hospital Stay (HOSPITAL_BASED_OUTPATIENT_CLINIC_OR_DEPARTMENT_OTHER): Payer: Self-pay | Admitting: Hematology

## 2021-10-29 VITALS — BP 153/96 | HR 95 | Temp 99.2°F | Resp 18 | Ht 64.5 in | Wt 232.7 lb

## 2021-10-29 DIAGNOSIS — C182 Malignant neoplasm of ascending colon: Secondary | ICD-10-CM

## 2021-10-29 NOTE — Progress Notes (Signed)
Alison Ramsey, [place] [address]   CLINIC:  Medical Oncology/Hematology  PCP:  Sharion Balloon, [place] [place] / [place] [address] [phone]   REASON FOR VISIT:  Follow-up for descending colon cancer  PRIOR THERAPY: Left hemicolectomy (04/15/2021): Moderately differentiated adenocarcinoma, grade 2, 2/35 lymph nodes positive, margins negative, PT3PN1B.  MMR preserved.  NGS Results: not done  CURRENT THERAPY: Xelox (Capeox) q21d  BRIEF ONCOLOGIC HISTORY:  Oncology History  Colon cancer ([facility])  05/09/2021 Initial Diagnosis   Colon cancer ([facility])   05/20/2021 - 07/22/2021 Chemotherapy   Patient is on Treatment Plan : COLORECTAL Xelox (Capeox) q21d       CANCER STAGING:  Cancer Staging  Colon cancer [facility]) Staging form: Colon and Rectum, AJCC 8th Edition - Clinical stage from 05/09/2021: Stage IIIB (cT3, cN1b, cM0) - Unsigned   INTERVAL HISTORY:  Ms. CRYSTIN LECHTENBERG, a 51 y.o. female, seen for follow-up of colon cancer.  Denies any bleeding per rectum or melena.  Last chemotherapy was on 07/22/2021.  Reports occasional pins-and-needles sensation in the fingertips when she spends more time on the computer.  She reported improvement in energy levels since completion of adjuvant chemotherapy.   REVIEW OF SYSTEMS:  Review of Systems  Constitutional:  Negative for appetite change and fatigue.  HENT:   Negative for mouth sores.   Neurological:  Positive for numbness (In the fingertips at times).  All other systems reviewed and are negative.   PAST MEDICAL/SURGICAL HISTORY:  Past Medical History:  Diagnosis Date   Cancer ([facility])    Hypertension    Lyme disease 2018   Past Surgical History:  Procedure Laterality Date   ABDOMINAL HYSTERECTOMY  2013   BIOPSY  04/13/2021   Procedure: BIOPSY;  Surgeon: [name], DO;  [facility];  Service: Endoscopy;;  descending colon mass   FLEXIBLE SIGMOIDOSCOPY N/A 04/13/2021    Procedure: FLEXIBLE SIGMOIDOSCOPY;  Surgeon: [name], DO;  Location: [facility];  Service: Endoscopy;  Laterality: N/A;   PARTIAL COLECTOMY N/A 04/15/2021   Procedure: EXTENDED RIGHT HEMICOLECTOMY;  Surgeon: Aviva Signs, MD;  Location: [facility] ORS;  Service: General;  Laterality: N/A;   PORTACATH PLACEMENT Left 05/13/2021   Procedure: INSERTION PORT-A-CATH;  Surgeon: Aviva Signs, MD;  Location: [facility] ORS;  Service: General;  Laterality: Left;   WISDOM TOOTH EXTRACTION      SOCIAL HISTORY:  Social History   Socioeconomic History   Marital status: Married    Spouse name: Not on file   Number of children: Not on file   Years of education: Not on file   Highest education level: Not on file  Occupational History   Not on file  Tobacco Use   Smoking status: Never   Smokeless tobacco: Never  Vaping Use   Vaping Use: Never used  Substance and Sexual Activity   Alcohol use: Yes    Comment: occasionally   Drug use: Yes    Types: Marijuana   Sexual activity: Yes  Other Topics Concern   Not on file  Social History Narrative   Not on file   Social Determinants of Health   Financial Resource Strain: Not on file  Food Insecurity: Not on file  Transportation Needs: Not on file  Physical Activity: Not on file  Stress: Not on file  Social Connections: Not on file  Intimate Partner Violence: Not on file    FAMILY HISTORY:  Family History  Problem Relation Age of Onset   Kidney disease Mother    Hypertension Mother    Breast cancer Mother    Stroke Father    Colon cancer Maternal Aunt        d. 97s   Other Maternal Aunt        pituitary cancer   Stomach cancer Maternal Uncle    Colon cancer Cousin        d. 73s; maternal first cousin   Breast cancer Paternal Aunt     CURRENT MEDICATIONS:  Current Outpatient Medications  Medication Sig Dispense Refill   acetaminophen (TYLENOL) 500 MG tablet Take 1,000 mg by mouth every 6 (six) hours as needed for mild pain or  headache.     amLODipine (NORVASC) 10 MG tablet Take 1 tablet (10 mg total) by mouth daily. 30 tablet 2   ASHWAGANDHA PO Take 1 tablet by mouth daily.     BLACK CURRANT SEED OIL PO Take by mouth.     capecitabine (XELODA) 500 MG tablet Take 4 tablets (2,000 mg total) by mouth 2 (two) times daily after a meal. Take for 14 days, then hold for 7 days. Repeat every 21 days. 112 tablet 3   diphenoxylate-atropine (LOMOTIL) 2.5-0.025 MG tablet Take 1 tablet by mouth 4 (four) times daily as needed for diarrhea or loose stools. Take Lomotil if continuing to have > 2 watery bowel movements despite Imodium. 30 tablet 0   EPINEPHrine 0.3 mg/0.3 mL IJ SOAJ injection Inject 0.3 mg into the muscle as needed for anaphylaxis. 1 each 2   FIBER PO Take 1 tablet by mouth daily.     ibuprofen (ADVIL) 200 MG tablet Take 400 mg by mouth every 6 (six) hours as needed for headache or mild pain.     Lactobacillus (ACIDOPHILUS PO) Take 1 capsule by mouth daily.     MAGNESIUM PO Take 1 tablet by mouth daily.     metoprolol tartrate (LOPRESSOR) 50 MG tablet Take 1 tablet (50 mg total) by mouth 2 (two) times daily. 60 tablet 2   Multiple Vitamins-Minerals (ZINC PO) Take 1 tablet by mouth daily.     OXALIPLATIN IV Inject into the vein every 21 ( twenty-one) days.     SELENIUM PO Take by mouth.     VITAMIN D PO Take 1 tablet by mouth daily.     No current facility-administered medications for this visit.    ALLERGIES:  Allergies  Allergen Reactions   Aleve [Naproxen] Hives    Ok with ibuprofen   Codeine Hives, Nausea And Vomiting and Rash   Latex Rash    PHYSICAL EXAM:  Performance status (ECOG): 0 - Asymptomatic  Vitals:   10/29/21 1525  BP: (!) 153/96  Pulse: 95  Resp: 18  Temp: 99.2 F (37.3 C)  SpO2: 99%   Wt Readings from Last 3 Encounters:  10/29/21 232 lb 11.2 oz (105.6 kg)  07/22/21 226 lb 12.8 oz (102.9 kg)  06/10/21 223 lb 1.7 oz (101.2 kg)   Physical Exam Vitals reviewed.  Constitutional:       Appearance: Normal appearance. She is obese.  Cardiovascular:     Rate and Rhythm: Normal rate and regular rhythm.     Pulses: Normal pulses.     Heart sounds: Normal heart sounds.  Pulmonary:     Effort: Pulmonary effort is normal.     Breath sounds: Normal breath sounds.  Neurological:     General: No focal deficit present.  Mental Status: She is alert and oriented to person, place, and time.  Psychiatric:        Mood and Affect: Mood normal.        Behavior: Behavior normal.     LABORATORY DATA:  I have reviewed the labs as listed.     Latest Ref Rng & Units 10/22/2021    8:05 AM 07/22/2021    8:56 AM 07/01/2021    8:31 AM  CBC  WBC 4.0 - 10.5 K/uL 7.1  4.9  6.1   Hemoglobin 12.0 - 15.0 g/dL 12.9  10.7  10.5   Hematocrit 36.0 - 46.0 % 36.2  30.8  31.1   Platelets 150 - 400 K/uL 213  148  174       Latest Ref Rng & Units 10/22/2021    8:05 AM 07/22/2021    8:56 AM 07/01/2021    8:31 AM  CMP  Glucose 70 - 99 mg/dL 140  147  130   BUN 6 - 20 mg/dL _0 Creatinine 0.44 - 1.00 mg/dL 0.83  0.81  0.89   Sodium 135 - 145 mmol/L 138  139  135   Potassium 3.5 - 5.1 mmol/L 3.9  3.8  3.9   Chloride 98 - 111 mmol/L 106  107  104   CO2 22 - 32 mmol/L _1 Calcium 8.9 - 10.3 mg/dL 9.1  8.8  8.8   Total Protein 6.5 - 8.1 g/dL 7.1  6.7  6.5   Total Bilirubin 0.3 - 1.2 mg/dL 0.9  1.0  1.0   Alkaline Phos 38 - 126 U/L 84  80  75   AST 15 - 41 U/L _2 ALT 0 - 44 U/L 33  33  32     DIAGNOSTIC IMAGING:  I have independently reviewed the scans and discussed with the patient. CT Abdomen Pelvis W Contrast  Result Date: 10/23/2021 CLINICAL DATA:  Follow-up colon carcinoma. Ongoing chemotherapy. * Tracking Code: BO * EXAM: CT ABDOMEN AND PELVIS WITH CONTRAST TECHNIQUE: Multidetector CT imaging of the abdomen and pelvis was performed using the standard protocol following bolus administration of intravenous contrast. RADIATION DOSE REDUCTION: This exam was  performed according to the departmental dose-optimization program which includes automated exposure control, adjustment of the mA and/or kV according to patient size and/or use of iterative reconstruction technique. CONTRAST:  173m OMNIPAQUE IOHEXOL 300 MG/ML  SOLN COMPARISON:  04/12/2021 FINDINGS: Lower Chest: No acute findings. Hepatobiliary: Moderate diffuse hepatic steatosis is increased since previous study. No hepatic masses identified. Gallbladder is unremarkable. No evidence of biliary ductal dilatation. Pancreas:  No mass or inflammatory changes. Spleen: Within normal limits in size and appearance. Adrenals/Urinary Tract: Stable 8 mm left adrenal nodule, consistent with benign adenoma (no followup imaging is recommended) . No suspicious renal masses identified. 2-3 mm calculus noted in lower pole of the left kidney. No evidence of ureteral calculi or hydronephrosis. Stomach/Bowel: Postop changes are seen from subtotal colectomy since prior study. No masses identified. No evidence of obstruction, inflammatory process or abnormal fluid collections. Vascular/Lymphatic: No pathologically enlarged lymph nodes. No acute vascular findings. Reproductive: Prior hysterectomy noted. Adnexal regions are unremarkable in appearance. Other:  None. Musculoskeletal:  No suspicious bone lesions identified. IMPRESSION: Interval subtotal colectomy. No evidence of recurrent or metastatic carcinoma within the abdomen or pelvis. Increased diffuse hepatic steatosis. Tiny left renal calculus. No evidence of ureteral calculi or hydronephrosis.  Electronically Signed   By: Marlaine Hind M.D.   On: 10/23/2021 11:03     ASSESSMENT:  Stage IIIb (T3N1B) descending colon cancer: - Presentation with abdominal pain for few weeks to the ER.  History of IBS and had colonoscopy more than 10 years ago.  No weight loss. - CT CAP with contrast (04/12/2021): Obstructive 5 cm distal descending colon neoplasm with associated proximal stercoral  colitis with no bowel perforation.  2 indeterminate right upper lobe lung nodules 1 calcified and 1 noncalcified measuring up to 5 mm.  2 approximately 5 mm hyperdense foci along the gallbladder lumen which may represent gallbladder polyp or stone. - Sigmoidoscopy (04/13/2021) fungating infiltrative obstructing large mass found in the descending colon. - Left hemicolectomy (04/15/2021): Moderately differentiated adenocarcinoma, grade 2, 2/35 lymph nodes positive, margins negative, PT3PN1B.  MMR preserved. - Preoperative CEA (04/12/2021): 2.5. - Adjuvant chemotherapy with 3 months of CapeOx was recommended with close monitoring of right lung nodules on subsequent scans. - 4 cycles of CapeOx from 05/20/2021 through 07/22/2021    Social/family history: - She lives at home with her husband.  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  No chemical exposure.  Quit smoking at age 31. - Maternal aunt had brain tumor.  Mother had breast cancer.  Maternal aunt died of colon cancer in her 53s.  Maternal first cousin died of colon cancer in his 74s.  Maternal uncle had stomach cancer.  Paternal aunt had breast cancer.  Maternal grandfather had lung cancer.   PLAN:  Stage IIIb (T3N1B) descending colon cancer, MMR stable: - She does not report any change in bowel habits or bleeding per rectum. - I reviewed her labs from 10/22/2021 which showed normal LFTs and CBC.  CEA was normal at 2.3. - CTAP (10/22/2021): No evidence of recurrence.  Diffuse hepatic steatosis.  Tiny left renal calculus. - Recommend a completion colonoscopy with Dr. Abbey Chatters.  She had only sigmoidoscopy at the time of diagnosis. - Discussed surveillance plan with every 3 months labs and a CT scan every 6 months during the first 2 years. - RTC 3 months for follow-up with repeat labs and CEA.   2.  Diarrhea: - She has about 2-3 loose stools since surgery.  She rarely requires Imodium.  3.  Normocytic anemia: - Latest labs show improvement in anemia  with normal hemoglobin 12.9.  Ferritin is 47 and percent saturation 19.  She is not taking iron tablet daily.  I have recommended that she take it 3 times weekly.   Orders placed this encounter:  Orders Placed This Encounter  Procedures   CBC with Differential/Platelet   Comprehensive metabolic panel   CEA      Derek Jack, MD [place] [phone]

## 2021-10-29 NOTE — Patient Instructions (Signed)
[facility]  Discharge Instructions  You were seen and examined today by Dr. Delton Coombes.  Dr. Delton Coombes discussed your most recent lab work and CT scan which revealed that everything looks good.  Get your Colonoscopy Dr. Delton Coombes sent referral to Dr. Abbey Chatters to have it set up.  Follow-up as scheduled in 3 months.    Thank you for choosing [facility] to provide your oncology and hematology care.   To afford each patient quality time with our provider, please arrive at least 15 minutes before your scheduled appointment time. You may need to reschedule your appointment if you arrive late (10 or more minutes). Arriving late affects you and other patients whose appointments are after yours.  Also, if you miss three or more appointments without notifying the office, you may be dismissed from the clinic at the provider's discretion.    Again, thank you for choosing [facility].  Our hope is that these requests will decrease the amount of time that you wait before being seen by our physicians.   If you have a lab appointment with the [facility] please come in thru the Main Entrance and check in at the main information desk.           _____________________________________________________________  Should you have questions after your visit to [facility], please contact our office at [phone] and follow the prompts.  Our office hours are 8:00 a.m. to 4:30 p.m. Monday - Thursday and 8:00 a.m. to 2:30 p.m. Friday.  Please note that voicemails left after 4:00 p.m. may not be returned until the following business day.  We are closed weekends and all major holidays.  You do have access to a nurse 24-7, just call the main number to the clinic [phone] and do not press any options, hold on the line and a nurse will answer the phone.    For prescription refill requests, have your pharmacy contact our office and  allow 72 hours.    Masks are optional in the cancer centers. If you would like for your care team to wear a mask while they are taking care of you, please let them know. You may have one support person who is at least 51 years old accompany you for your appointments.

## 2021-10-30 ENCOUNTER — Encounter: Payer: Self-pay | Admitting: *Deleted

## 2021-11-20 ENCOUNTER — Ambulatory Visit: Payer: Self-pay | Admitting: Internal Medicine

## 2022-01-16 ENCOUNTER — Ambulatory Visit: Payer: 59 | Admitting: Internal Medicine

## 2022-01-16 ENCOUNTER — Encounter (HOSPITAL_COMMUNITY): Payer: Self-pay | Admitting: Hematology

## 2022-01-16 ENCOUNTER — Encounter: Payer: Self-pay | Admitting: Internal Medicine

## 2022-01-16 VITALS — BP 173/125 | HR 96 | Temp 97.4°F | Ht 65.0 in | Wt 236.2 lb

## 2022-01-16 DIAGNOSIS — C186 Malignant neoplasm of descending colon: Secondary | ICD-10-CM | POA: Diagnosis not present

## 2022-01-16 DIAGNOSIS — I10 Essential (primary) hypertension: Secondary | ICD-10-CM | POA: Diagnosis not present

## 2022-01-16 DIAGNOSIS — R197 Diarrhea, unspecified: Secondary | ICD-10-CM | POA: Diagnosis not present

## 2022-01-16 NOTE — Patient Instructions (Addendum)
Will schedule you for colonoscopy for surveillance purposes.  Continue to follow with Dr. Raliegh Ip of oncology.  Can take Imodium as needed for diarrhea though sounds like this is improved.  Would recommend you reestablish with your primary care doctor in regards to your blood pressure as it is quite elevated today.  It was good seeing you again today.  Dr. Abbey Chatters

## 2022-01-16 NOTE — H&P (View-Only) (Signed)
Referring Provider: Sharion Balloon, FNP Primary Care Physician:  Sharion Balloon, FNP Primary GI:  Dr. Abbey Chatters  Chief Complaint  Patient presents with   New Patient (Initial Visit)    Pt referred for discuss TC    HPI:   Alison Ramsey is a 52 y.o. female who presents to the clinic today for follow-up visit.  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  Currently following with Dr. Raliegh Ip of oncology.  Has completed chemotherapy with most recent CT scan October 2023 without evidence of cancer.  She is here today to discuss surveillance colonoscopy.  Overall doing well.  No melena hematochezia.  No weight loss.  No abdominal pain.  Was having diarrhea during chemo treatments though she states this is better.  Takes as needed Imodium.  Blood pressure is quite high today.  States she has not seen her PCP but just got insurance changed and plans to reach out to them shortly.  Previously on amlodipine though stopped this as it caused swelling.  Past Medical History:  Diagnosis Date   Cancer [facility])    Hypertension    Lyme disease 2018    Past Surgical History:  Procedure Laterality Date   ABDOMINAL HYSTERECTOMY  2013   BIOPSY  04/13/2021   Procedure: BIOPSY;  Surgeon: [name], DO;  [facility];  Service: Endoscopy;;  descending colon mass   FLEXIBLE SIGMOIDOSCOPY N/A 04/13/2021   Procedure: FLEXIBLE SIGMOIDOSCOPY;  Surgeon: [name], DO;  [facility];  Service: Endoscopy;  Laterality: N/A;   PARTIAL COLECTOMY N/A 04/15/2021   Procedure: EXTENDED RIGHT HEMICOLECTOMY;  Surgeon: Aviva Signs, MD;  Location: [facility] ORS;  Service: General;  Laterality: N/A;   PORTACATH PLACEMENT Left 05/13/2021   Procedure: INSERTION PORT-A-CATH;  Surgeon: Aviva Signs, MD;  Location: [facility];  Service: General;  Laterality: Left;   WISDOM TOOTH EXTRACTION      Current Outpatient Medications  Medication Sig  Dispense Refill   acetaminophen (TYLENOL) 500 MG tablet Take 1,000 mg by mouth every 6 (six) hours as needed for mild pain or headache.     amLODipine (NORVASC) 10 MG tablet Take 1 tablet (10 mg total) by mouth daily. 30 tablet 2   ASHWAGANDHA PO Take 1 tablet by mouth daily.     BLACK CURRANT SEED OIL PO Take by mouth.     FIBER PO Take 1 tablet by mouth daily.     ibuprofen (ADVIL) 200 MG tablet Take 400 mg by mouth every 6 (six) hours as needed for headache or mild pain.     Lactobacillus (ACIDOPHILUS PO) Take 1 capsule by mouth daily.     MAGNESIUM PO Take 1 tablet by mouth daily.     Multiple Vitamins-Minerals (ZINC PO) Take 1 tablet by mouth daily.     OXALIPLATIN IV Inject into the vein every 21 ( twenty-one) days.     VITAMIN D PO Take 1 tablet by mouth daily.     capecitabine (XELODA) 500 MG tablet Take 4 tablets (2,000 mg total) by mouth 2 (two) times daily after a meal. Take for 14 days, then hold for 7 days. Repeat every 21 days. (Patient not taking: Reported on 01/16/2022) 112 tablet 3   diphenoxylate-atropine (LOMOTIL) 2.5-0.025 MG tablet Take 1 tablet by mouth 4 (four) times daily as needed for diarrhea or loose stools. Take Lomotil if continuing to have > 2 watery bowel  movements despite Imodium. (Patient not taking: Reported on 01/16/2022) 30 tablet 0   EPINEPHrine 0.3 mg/0.3 mL IJ SOAJ injection Inject 0.3 mg into the muscle as needed for anaphylaxis. (Patient not taking: Reported on 01/16/2022) 1 each 2   metoprolol tartrate (LOPRESSOR) 50 MG tablet Take 1 tablet (50 mg total) by mouth 2 (two) times daily. (Patient not taking: Reported on 01/16/2022) 60 tablet 2   SELENIUM PO Take by mouth. (Patient not taking: Reported on 01/16/2022)     No current facility-administered medications for this visit.    Allergies as of 01/16/2022 - Review Complete 01/16/2022  Allergen Reaction Noted   Aleve [naproxen] Hives 04/12/2021   Codeine Hives, Nausea And Vomiting, and Rash 04/14/2016    Latex Rash 05/23/2015    Family History  Problem Relation Age of Onset   Kidney disease Mother    Hypertension Mother    Breast cancer Mother    Stroke Father    Colon cancer Maternal Aunt        d. 24s   Other Maternal Aunt        pituitary cancer   Stomach cancer Maternal Uncle    Colon cancer Cousin        d. 56s; maternal first cousin   Breast cancer Paternal Aunt     Social History   Socioeconomic History   Marital status: Married    Spouse name: Not on file   Number of children: Not on file   Years of education: Not on file   Highest education level: Not on file  Occupational History   Not on file  Tobacco Use   Smoking status: Never   Smokeless tobacco: Never  Vaping Use   Vaping Use: Never used  Substance and Sexual Activity   Alcohol use: Yes    Comment: occasionally   Drug use: Yes    Types: Marijuana   Sexual activity: Yes  Other Topics Concern   Not on file  Social History Narrative   Not on file   Social Determinants of Health   Financial Resource Strain: Not on file  Food Insecurity: Not on file  Transportation Needs: Not on file  Physical Activity: Not on file  Stress: Not on file  Social Connections: Not on file    Subjective: Review of Systems  Constitutional:  Negative for chills and fever.  HENT:  Negative for congestion and hearing loss.   Eyes:  Negative for blurred vision and double vision.  Respiratory:  Negative for cough and shortness of breath.   Cardiovascular:  Negative for chest pain and palpitations.  Gastrointestinal:  Negative for abdominal pain, blood in stool, constipation, diarrhea, heartburn, melena and vomiting.  Genitourinary:  Negative for dysuria and urgency.  Musculoskeletal:  Negative for joint pain and myalgias.  Skin:  Negative for itching and rash.  Neurological:  Negative for dizziness and headaches.  Psychiatric/Behavioral:  Negative for depression. The patient is not nervous/anxious.       Objective: BP (!) 173/125   Pulse 96   Temp (!) 97.4 F (36.3 C)   Ht '5\' 5"'$  (1.651 m)   Wt 236 lb 3.2 oz (107.1 kg)   BMI 39.31 kg/m  Physical Exam Constitutional:      Appearance: Normal appearance.  HENT:     Head: Normocephalic and atraumatic.  Eyes:     Extraocular Movements: Extraocular movements intact.     Conjunctiva/sclera: Conjunctivae normal.  Cardiovascular:     Rate and Rhythm: Normal rate and  regular rhythm.  Pulmonary:     Effort: Pulmonary effort is normal.     Breath sounds: Normal breath sounds.  Abdominal:     General: Bowel sounds are normal.     Palpations: Abdomen is soft.  Musculoskeletal:        General: No swelling. Normal range of motion.     Cervical back: Normal range of motion and neck supple.  Skin:    General: Skin is warm and dry.     Coloration: Skin is not jaundiced.  Neurological:     General: No focal deficit present.     Mental Status: She is alert and oriented to person, place, and time.  Psychiatric:        Mood and Affect: Mood normal.        Behavior: Behavior normal.      Assessment: *Stage III adenocarcinoma of the colon s/p extended R hemicolectomy *Diarrhea-improved after complete of chemotherapy *Hypertension  Plan: Will schedule for surveillance colonoscopy.The risks including infection, bleed, or perforation as well as benefits, limitations, alternatives and imponderables have been reviewed with the patient. Questions have been answered. All parties agreeable.  Imodium as needed for diarrhea.  The patient was found to have elevated blood pressure when vital signs were checked in the office. The blood pressure was rechecked by the nursing staff, and it was found be persistently elevated >140/90 mmHg (187/104 on recheck) I personally advised the patient to follow up closely with the PCP for hypertension control.  She states she will do so as she just got insurance.  Currently asymptomatic.  If develops symptoms,  would recommend she go to the ER for evaluation.  She understands.  01/16/2022 10:47 AM   Disclaimer: This note was dictated with voice recognition software. Similar sounding words can inadvertently be transcribed and may not be corrected upon review.

## 2022-01-16 NOTE — Progress Notes (Signed)
Referring Provider: Sharion Balloon, FNP Primary Care Physician:  Sharion Balloon, FNP Primary GI:  Dr. Abbey Chatters  Chief Complaint  Patient presents with   New Patient (Initial Visit)    Pt referred for discuss TC    HPI:   Alison Ramsey is a 52 y.o. female who presents to the clinic today for follow-up visit.  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  Currently following with Dr. Raliegh Ip of oncology.  Has completed chemotherapy with most recent CT scan October 2023 without evidence of cancer.  She is here today to discuss surveillance colonoscopy.  Overall doing well.  No melena hematochezia.  No weight loss.  No abdominal pain.  Was having diarrhea during chemo treatments though she states this is better.  Takes as needed Imodium.  Blood pressure is quite high today.  States she has not seen her PCP but just got insurance changed and plans to reach out to them shortly.  Previously on amlodipine though stopped this as it caused swelling.  Past Medical History:  Diagnosis Date   Cancer [facility])    Hypertension    Lyme disease 2018    Past Surgical History:  Procedure Laterality Date   ABDOMINAL HYSTERECTOMY  2013   BIOPSY  04/13/2021   Procedure: BIOPSY;  Surgeon: [name], DO;  Location: [facility];  Service: Endoscopy;;  descending colon mass   FLEXIBLE SIGMOIDOSCOPY N/A 04/13/2021   Procedure: FLEXIBLE SIGMOIDOSCOPY;  Surgeon: [name], DO;  Location: [facility];  Service: Endoscopy;  Laterality: N/A;   PARTIAL COLECTOMY N/A 04/15/2021   Procedure: EXTENDED RIGHT HEMICOLECTOMY;  Surgeon: Aviva Signs, MD;  Location: [facility] ORS;  Service: General;  Laterality: N/A;   PORTACATH PLACEMENT Left 05/13/2021   Procedure: INSERTION PORT-A-CATH;  Surgeon: Aviva Signs, MD;  Location: [facility] ORS;  Service: General;  Laterality: Left;   WISDOM TOOTH EXTRACTION      Current Outpatient Medications  Medication Sig  Dispense Refill   acetaminophen (TYLENOL) 500 MG tablet Take 1,000 mg by mouth every 6 (six) hours as needed for mild pain or headache.     amLODipine (NORVASC) 10 MG tablet Take 1 tablet (10 mg total) by mouth daily. 30 tablet 2   ASHWAGANDHA PO Take 1 tablet by mouth daily.     BLACK CURRANT SEED OIL PO Take by mouth.     FIBER PO Take 1 tablet by mouth daily.     ibuprofen (ADVIL) 200 MG tablet Take 400 mg by mouth every 6 (six) hours as needed for headache or mild pain.     Lactobacillus (ACIDOPHILUS PO) Take 1 capsule by mouth daily.     MAGNESIUM PO Take 1 tablet by mouth daily.     Multiple Vitamins-Minerals (ZINC PO) Take 1 tablet by mouth daily.     OXALIPLATIN IV Inject into the vein every 21 ( twenty-one) days.     VITAMIN D PO Take 1 tablet by mouth daily.     capecitabine (XELODA) 500 MG tablet Take 4 tablets (2,000 mg total) by mouth 2 (two) times daily after a meal. Take for 14 days, then hold for 7 days. Repeat every 21 days. (Patient not taking: Reported on 01/16/2022) 112 tablet 3   diphenoxylate-atropine (LOMOTIL) 2.5-0.025 MG tablet Take 1 tablet by mouth 4 (four) times daily as needed for diarrhea or loose stools. Take Lomotil if continuing to have > 2 watery bowel  movements despite Imodium. (Patient not taking: Reported on 01/16/2022) 30 tablet 0   EPINEPHrine 0.3 mg/0.3 mL IJ SOAJ injection Inject 0.3 mg into the muscle as needed for anaphylaxis. (Patient not taking: Reported on 01/16/2022) 1 each 2   metoprolol tartrate (LOPRESSOR) 50 MG tablet Take 1 tablet (50 mg total) by mouth 2 (two) times daily. (Patient not taking: Reported on 01/16/2022) 60 tablet 2   SELENIUM PO Take by mouth. (Patient not taking: Reported on 01/16/2022)     No current facility-administered medications for this visit.    Allergies as of 01/16/2022 - Review Complete 01/16/2022  Allergen Reaction Noted   Aleve [naproxen] Hives 04/12/2021   Codeine Hives, Nausea And Vomiting, and Rash 04/14/2016    Latex Rash 05/23/2015    Family History  Problem Relation Age of Onset   Kidney disease Mother    Hypertension Mother    Breast cancer Mother    Stroke Father    Colon cancer Maternal Aunt        d. 32s   Other Maternal Aunt        pituitary cancer   Stomach cancer Maternal Uncle    Colon cancer Cousin        d. 47s; maternal first cousin   Breast cancer Paternal Aunt     Social History   Socioeconomic History   Marital status: Married    Spouse name: Not on file   Number of children: Not on file   Years of education: Not on file   Highest education level: Not on file  Occupational History   Not on file  Tobacco Use   Smoking status: Never   Smokeless tobacco: Never  Vaping Use   Vaping Use: Never used  Substance and Sexual Activity   Alcohol use: Yes    Comment: occasionally   Drug use: Yes    Types: Marijuana   Sexual activity: Yes  Other Topics Concern   Not on file  Social History Narrative   Not on file   Social Determinants of Health   Financial Resource Strain: Not on file  Food Insecurity: Not on file  Transportation Needs: Not on file  Physical Activity: Not on file  Stress: Not on file  Social Connections: Not on file    Subjective: Review of Systems  Constitutional:  Negative for chills and fever.  HENT:  Negative for congestion and hearing loss.   Eyes:  Negative for blurred vision and double vision.  Respiratory:  Negative for cough and shortness of breath.   Cardiovascular:  Negative for chest pain and palpitations.  Gastrointestinal:  Negative for abdominal pain, blood in stool, constipation, diarrhea, heartburn, melena and vomiting.  Genitourinary:  Negative for dysuria and urgency.  Musculoskeletal:  Negative for joint pain and myalgias.  Skin:  Negative for itching and rash.  Neurological:  Negative for dizziness and headaches.  Psychiatric/Behavioral:  Negative for depression. The patient is not nervous/anxious.       Objective: BP (!) 173/125   Pulse 96   Temp (!) 97.4 F (36.3 C)   Ht '5\' 5"'$  (1.651 m)   Wt 236 lb 3.2 oz (107.1 kg)   BMI 39.31 kg/m  Physical Exam Constitutional:      Appearance: Normal appearance.  HENT:     Head: Normocephalic and atraumatic.  Eyes:     Extraocular Movements: Extraocular movements intact.     Conjunctiva/sclera: Conjunctivae normal.  Cardiovascular:     Rate and Rhythm: Normal rate and  regular rhythm.  Pulmonary:     Effort: Pulmonary effort is normal.     Breath sounds: Normal breath sounds.  Abdominal:     General: Bowel sounds are normal.     Palpations: Abdomen is soft.  Musculoskeletal:        General: No swelling. Normal range of motion.     Cervical back: Normal range of motion and neck supple.  Skin:    General: Skin is warm and dry.     Coloration: Skin is not jaundiced.  Neurological:     General: No focal deficit present.     Mental Status: She is alert and oriented to person, place, and time.  Psychiatric:        Mood and Affect: Mood normal.        Behavior: Behavior normal.      Assessment: *Stage III adenocarcinoma of the colon s/p extended R hemicolectomy *Diarrhea-improved after complete of chemotherapy *Hypertension  Plan: Will schedule for surveillance colonoscopy.The risks including infection, bleed, or perforation as well as benefits, limitations, alternatives and imponderables have been reviewed with the patient. Questions have been answered. All parties agreeable.  Imodium as needed for diarrhea.  The patient was found to have elevated blood pressure when vital signs were checked in the office. The blood pressure was rechecked by the nursing staff, and it was found be persistently elevated >140/90 mmHg (187/104 on recheck) I personally advised the patient to follow up closely with the PCP for hypertension control.  She states she will do so as she just got insurance.  Currently asymptomatic.  If develops symptoms,  would recommend she go to the ER for evaluation.  She understands.  01/16/2022 10:47 AM   Disclaimer: This note was dictated with voice recognition software. Similar sounding words can inadvertently be transcribed and may not be corrected upon review.

## 2022-01-17 ENCOUNTER — Encounter: Payer: Self-pay | Admitting: *Deleted

## 2022-01-17 ENCOUNTER — Encounter (HOSPITAL_COMMUNITY): Payer: Self-pay | Admitting: Hematology

## 2022-01-17 ENCOUNTER — Telehealth: Payer: Self-pay | Admitting: *Deleted

## 2022-01-17 ENCOUNTER — Other Ambulatory Visit (HOSPITAL_COMMUNITY): Payer: Self-pay

## 2022-01-17 MED ORDER — PEG 3350-KCL-NA BICARB-NACL 420 G PO SOLR: 4000.0000 mL | Freq: Once | ORAL | 0 refills | Status: AC

## 2022-01-17 NOTE — Telephone Encounter (Signed)
[employer] PA:    CPT Code GECOL Description: Colonoscopy Authorization Number: [id number] Review Date: 01/17/2022 11:01:43 AM Expiration Date: 04/17/2022 Status: Your case has been Approved

## 2022-01-23 ENCOUNTER — Inpatient Hospital Stay: Payer: 59 | Attending: Genetic Counselor

## 2022-01-23 DIAGNOSIS — I10 Essential (primary) hypertension: Secondary | ICD-10-CM | POA: Diagnosis not present

## 2022-01-23 DIAGNOSIS — C186 Malignant neoplasm of descending colon: Secondary | ICD-10-CM | POA: Diagnosis present

## 2022-01-23 DIAGNOSIS — C182 Malignant neoplasm of ascending colon: Secondary | ICD-10-CM

## 2022-01-23 DIAGNOSIS — Z79899 Other long term (current) drug therapy: Secondary | ICD-10-CM | POA: Diagnosis not present

## 2022-01-23 DIAGNOSIS — D649 Anemia, unspecified: Secondary | ICD-10-CM | POA: Diagnosis not present

## 2022-01-23 DIAGNOSIS — Z87891 Personal history of nicotine dependence: Secondary | ICD-10-CM | POA: Insufficient documentation

## 2022-01-23 LAB — CBC WITH DIFFERENTIAL/PLATELET
Abs Immature Granulocytes: 0.02 10*3/uL (ref 0.00–0.07)
Basophils Absolute: 0 10*3/uL (ref 0.0–0.1)
Basophils Relative: 0 %
Eosinophils Absolute: 0.1 10*3/uL (ref 0.0–0.5)
Eosinophils Relative: 1 %
HCT: 39.2 % (ref 36.0–46.0)
Hemoglobin: 13.9 g/dL (ref 12.0–15.0)
Immature Granulocytes: 0 %
Lymphocytes Relative: 19 %
Lymphs Abs: 1.3 10*3/uL (ref 0.7–4.0)
MCH: 29.3 pg (ref 26.0–34.0)
MCHC: 35.5 g/dL (ref 30.0–36.0)
MCV: 82.7 fL (ref 80.0–100.0)
Monocytes Absolute: 0.4 10*3/uL (ref 0.1–1.0)
Monocytes Relative: 5 %
Neutro Abs: 5.3 10*3/uL (ref 1.7–7.7)
Neutrophils Relative %: 75 %
Platelets: 248 10*3/uL (ref 150–400)
RBC: 4.74 MIL/uL (ref 3.87–5.11)
RDW: 13.7 % (ref 11.5–15.5)
WBC: 7.1 10*3/uL (ref 4.0–10.5)
nRBC: 0 % (ref 0.0–0.2)

## 2022-01-23 LAB — COMPREHENSIVE METABOLIC PANEL
ALT: 42 U/L (ref 0–44)
AST: 32 U/L (ref 15–41)
Albumin: 4.6 g/dL (ref 3.5–5.0)
Alkaline Phosphatase: 86 U/L (ref 38–126)
Anion gap: 12 (ref 5–15)
BUN: 10 mg/dL (ref 6–20)
CO2: 25 mmol/L (ref 22–32)
Calcium: 9 mg/dL (ref 8.9–10.3)
Chloride: 95 mmol/L — ABNORMAL LOW (ref 98–111)
Creatinine, Ser: 0.9 mg/dL (ref 0.44–1.00)
GFR, Estimated: >60 mL/min (ref >60)
Glucose, Bld: 111 mg/dL — ABNORMAL HIGH (ref 70–99)
Potassium: 3.8 mmol/L (ref 3.5–5.1)
Sodium: 132 mmol/L — ABNORMAL LOW (ref 135–145)
Total Bilirubin: 1.3 mg/dL — ABNORMAL HIGH (ref 0.3–1.2)
Total Protein: 7.6 g/dL (ref 6.5–8.1)

## 2022-01-23 LAB — MAGNESIUM: Magnesium: 2.2 mg/dL (ref 1.7–2.4)

## 2022-01-24 ENCOUNTER — Encounter (HOSPITAL_COMMUNITY): Payer: Self-pay

## 2022-01-24 ENCOUNTER — Ambulatory Visit (HOSPITAL_COMMUNITY)
Admission: RE | Admit: 2022-01-24 | Discharge: 2022-01-24 | Disposition: A | Discharge status: Home / Self Care | Payer: 59 | Source: Ambulatory Visit | Attending: Internal Medicine | Admitting: Internal Medicine

## 2022-01-24 ENCOUNTER — Encounter (HOSPITAL_COMMUNITY)
Admission: RE | Disposition: A | Discharge status: Home / Self Care | Payer: Self-pay | Source: Ambulatory Visit | Attending: Internal Medicine

## 2022-01-24 ENCOUNTER — Other Ambulatory Visit: Payer: Self-pay

## 2022-01-24 ENCOUNTER — Ambulatory Visit (HOSPITAL_BASED_OUTPATIENT_CLINIC_OR_DEPARTMENT_OTHER): Payer: 59 | Admitting: Certified Registered Nurse Anesthetist

## 2022-01-24 ENCOUNTER — Ambulatory Visit (HOSPITAL_COMMUNITY): Payer: 59 | Admitting: Certified Registered Nurse Anesthetist

## 2022-01-24 ENCOUNTER — Telehealth: Payer: Self-pay | Admitting: Internal Medicine

## 2022-01-24 DIAGNOSIS — Z85038 Personal history of other malignant neoplasm of large intestine: Secondary | ICD-10-CM | POA: Diagnosis not present

## 2022-01-24 DIAGNOSIS — I10 Essential (primary) hypertension: Secondary | ICD-10-CM | POA: Insufficient documentation

## 2022-01-24 DIAGNOSIS — Z08 Encounter for follow-up examination after completed treatment for malignant neoplasm: Secondary | ICD-10-CM | POA: Diagnosis present

## 2022-01-24 DIAGNOSIS — Z9049 Acquired absence of other specified parts of digestive tract: Secondary | ICD-10-CM | POA: Diagnosis not present

## 2022-01-24 DIAGNOSIS — Z980 Intestinal bypass and anastomosis status: Secondary | ICD-10-CM | POA: Diagnosis not present

## 2022-01-24 DIAGNOSIS — D124 Benign neoplasm of descending colon: Secondary | ICD-10-CM

## 2022-01-24 DIAGNOSIS — D125 Benign neoplasm of sigmoid colon: Secondary | ICD-10-CM | POA: Diagnosis not present

## 2022-01-24 DIAGNOSIS — K5140 Inflammatory polyps of colon without complications: Secondary | ICD-10-CM | POA: Diagnosis not present

## 2022-01-24 DIAGNOSIS — D126 Benign neoplasm of colon, unspecified: Secondary | ICD-10-CM

## 2022-01-24 DIAGNOSIS — Z9221 Personal history of antineoplastic chemotherapy: Secondary | ICD-10-CM | POA: Diagnosis not present

## 2022-01-24 HISTORY — PX: SUBMUCOSAL LIFTING INJECTION: SHX6855

## 2022-01-24 HISTORY — DX: Malignant neoplasm of colon, unspecified: C18.9

## 2022-01-24 HISTORY — PX: POLYPECTOMY: SHX5525

## 2022-01-24 HISTORY — PX: BIOPSY: SHX5522

## 2022-01-24 HISTORY — PX: HEMOSTASIS CLIP PLACEMENT: SHX6857

## 2022-01-24 HISTORY — PX: COLONOSCOPY WITH PROPOFOL: SHX5780

## 2022-01-24 SURGERY — COLONOSCOPY WITH PROPOFOL
Anesthesia: General

## 2022-01-24 MED ORDER — LIDOCAINE HCL (CARDIAC) PF 100 MG/5ML IV SOSY
PREFILLED_SYRINGE | INTRAVENOUS | Status: DC | PRN
  Administered 2022-01-24: 50 mg via INTRAVENOUS

## 2022-01-24 MED ORDER — LACTATED RINGERS IV SOLN: INTRAVENOUS | Status: DC

## 2022-01-24 MED ORDER — STERILE WATER FOR IRRIGATION IR SOLN
Status: DC | PRN
  Administered 2022-01-24: 120 mL

## 2022-01-24 MED ORDER — LACTATED RINGERS IV SOLN: INTRAVENOUS | Status: DC | PRN

## 2022-01-24 MED ORDER — PROPOFOL 10 MG/ML IV BOLUS
INTRAVENOUS | Status: DC | PRN
  Administered 2022-01-24: 30 mg via INTRAVENOUS
  Administered 2022-01-24: 100 mg via INTRAVENOUS
  Administered 2022-01-24: 30 mg via INTRAVENOUS
  Administered 2022-01-24: 80 mg via INTRAVENOUS
  Administered 2022-01-24: 30 mg via INTRAVENOUS

## 2022-01-24 NOTE — Anesthesia Preprocedure Evaluation (Signed)
Anesthesia Evaluation  Patient identified by MRN, date of birth, ID band Patient awake    Reviewed: Allergy & Precautions, H&P , NPO status , Patient's Chart, lab work & pertinent test results, reviewed documented beta blocker date and time   Airway Mallampati: II  TM Distance: >3 FB Neck ROM: full    Dental no notable dental hx.    Pulmonary neg pulmonary ROS   Pulmonary exam normal breath sounds clear to auscultation       Cardiovascular Exercise Tolerance: Good hypertension,  Rhythm:regular Rate:Normal     Neuro/Psych negative neurological ROS  negative psych ROS   GI/Hepatic negative GI ROS, Neg liver ROS,,,  Endo/Other  negative endocrine ROS    Renal/GU negative Renal ROS  negative genitourinary   Musculoskeletal   Abdominal   Peds  Hematology   Anesthesia Other Findings Colon cancer  Reproductive/Obstetrics negative OB ROS                             Anesthesia Physical Anesthesia Plan  ASA: 3  Anesthesia Plan: General   Post-op Pain Management:    Induction:   PONV Risk Score and Plan: Propofol infusion  Airway Management Planned:   Additional Equipment:   Intra-op Plan:   Post-operative Plan:   Informed Consent: I have reviewed the patients History and Physical, chart, labs and discussed the procedure including the risks, benefits and alternatives for the proposed anesthesia with the patient or authorized representative who has indicated his/her understanding and acceptance.     Dental Advisory Given  Plan Discussed with: CRNA  Anesthesia Plan Comments:         Anesthesia Quick Evaluation

## 2022-01-24 NOTE — Discharge Instructions (Addendum)
  Colonoscopy Discharge Instructions  Read the instructions outlined below and refer to this sheet in the next few weeks. These discharge instructions provide you with general information on caring for yourself after you leave the hospital. Your doctor may also give you specific instructions. While your treatment has been planned according to the most current medical practices available, unavoidable complications occasionally occur.   ACTIVITY You may resume your regular activity, but move at a slower pace for the next 24 hours.  Take frequent rest periods for the next 24 hours.  Walking will help get rid of the air and reduce the bloated feeling in your belly (abdomen).  No driving for 24 hours (because of the medicine (anesthesia) used during the test).   Do not sign any important legal documents or operate any machinery for 24 hours (because of the anesthesia used during the test).  NUTRITION Drink plenty of fluids.  You may resume your normal diet as instructed by your doctor.  Begin with a light meal and progress to your normal diet. Heavy or fried foods are harder to digest and may make you feel sick to your stomach (nauseated).  Avoid alcoholic beverages for 24 hours or as instructed.  MEDICATIONS You may resume your normal medications unless your doctor tells you otherwise.  WHAT YOU CAN EXPECT TODAY Some feelings of bloating in the abdomen.  Passage of more gas than usual.  Spotting of blood in your stool or on the toilet paper.  IF YOU HAD POLYPS REMOVED DURING THE COLONOSCOPY: No aspirin products for 7 days or as instructed.  No alcohol for 7 days or as instructed.  Eat a soft diet for the next 24 hours.  FINDING OUT THE RESULTS OF YOUR TEST Not all test results are available during your visit. If your test results are not back during the visit, make an appointment with your caregiver to find out the results. Do not assume everything is normal if you have not heard from your  caregiver or the medical facility. It is important for you to follow up on all of your test results.  SEEK IMMEDIATE MEDICAL ATTENTION IF: You have more than a spotting of blood in your stool.  Your belly is swollen (abdominal distention).  You are nauseated or vomiting.  You have a temperature over 101.  You have abdominal pain or discomfort that is severe or gets worse throughout the day.   Your colonoscopy revealed inflammation and what appears to be an inflammatory polyp  which was actively oozing blood at the anastomosis where they connected your small bowel to your colon.  I took samples of this.  Also removed the polyp.  Does not look like cancer recurrence.  I did place a metallic clip on the post polypectomy site given that it was bleeding.  This will naturally fall off over the next 4 to 6 weeks.  If you need an MRI for any reason in the near future, you will need x-ray prior.  You did have a large polyp further down in your sigmoid colon which required in-depth resection today.  Await pathology results, my office will contact you.  Given how large this polyp was, I would recommend repeat colonoscopy in 6 months.    I hope you have a great rest of your week!  Elon Alas. Abbey Chatters, D.O. Gastroenterology and Hepatology [facility]

## 2022-01-24 NOTE — Transfer of Care (Signed)
Immediate Anesthesia Transfer of Care Note  Patient: Alison Ramsey  Procedure(s) Performed: COLONOSCOPY WITH PROPOFOL POLYPECTOMY BIOPSY HEMOSTASIS CLIP PLACEMENT SUBMUCOSAL LIFTING INJECTION  Patient Location: Endoscopy Unit  Anesthesia Type:General  Level of Consciousness: awake, alert , and oriented  Airway & Oxygen Therapy: Patient Spontanous Breathing  Post-op Assessment: Report given to RN and Post -op Vital signs reviewed and stable  Post vital signs: Reviewed and stable  Last Vitals:  Vitals Value Taken Time  BP 122/75 01/24/22 1338  Temp 36.4 C 01/24/22 1336  Pulse 69 01/24/22 1336  Resp 20 01/24/22 1336  SpO2 98 % 01/24/22 1336  Vitals shown include unvalidated device data.  Last Pain:  Vitals:   01/24/22 1336  TempSrc: Oral  PainSc: 0-No pain      Patients Stated Pain Goal: 8 (83/38/25 0539)  Complications: No notable events documented.

## 2022-01-24 NOTE — Interval H&P Note (Signed)
History and Physical Interval Note:  01/24/2022 1:03 PM  Alison Ramsey  has presented today for surgery, with the diagnosis of surveillance.  The various methods of treatment have been discussed with the patient and family. After consideration of risks, benefits and other options for treatment, the patient has consented to  Procedure(s) with comments: COLONOSCOPY WITH PROPOFOL (N/A) - 1:00 pm as a surgical intervention.  The patient's history has been reviewed, patient examined, no change in status, stable for surgery.  I have reviewed the patient's chart and labs.  Questions were answered to the patient's satisfaction.     [name]

## 2022-01-24 NOTE — Op Note (Signed)
[facility] Patient Name: Alison Ramsey Procedure Date: 01/24/2022 1:00 PM MRN: [id number] Date of Birth: 1970-08-09 Attending MD: Elon Alas. Abbey Chatters , [place], [id number] CSN: [id number] Age: 52 Admit Type: Outpatient Procedure:                Colonoscopy Indications:              High risk colon cancer surveillance: Personal                            history of colon cancer Providers:                Elon Alas. Abbey Chatters, DO, Crystal Page, Illene Labrador Referring MD:              Medicines:                See the Anesthesia note for documentation of the                            administered medications Complications:            No immediate complications. Estimated Blood Loss:     Estimated blood loss was minimal. Procedure:                Pre-Anesthesia Assessment:                           - The anesthesia plan was to use monitored                            anesthesia care (MAC).                           After obtaining informed consent, the colonoscope                            was passed under direct vision. Throughout the                            procedure, the patient's blood pressure, pulse, and                            oxygen saturations were monitored continuously. The                            [id number]-HQ190L ([id number]) scope was introduced through                            the anus and advanced to the the ileocolonic                            anastomosis. The colonoscopy was performed without                            difficulty. The patient tolerated the procedure  well. The quality of the bowel preparation was                            excellent. Scope In: 1:10:37 PM Scope Out: 1:35:02 PM Scope Withdrawal Time: 0 hours 23 minutes 19 seconds  Total Procedure Duration: 0 hours 24 minutes 25 seconds  Findings:      The perianal and digital rectal examinations were normal.      There was evidence of a prior  side-to-side ileo-colonic anastomosis in       the descending colon. This was patent and was characterized by       congestion, edema and erythema. The anastomosis was traversed. Biopsies       were taken with a cold forceps for histology.      A 7 mm polyp was found in the anastomosis. The polyp was sessile,       ?inflammatory, actively bleeding. The polyp was removed with a hot       snare. Resection and retrieval were complete. For hemostasis, one       hemostatic clip was successfully placed (MR conditional). Clip       manufacturer: [employer]. There was no bleeding at the end of the       procedure.      A 25 mm polyp was found in the sigmoid colon. This polyp was likely       hidden from view due to poor/fair prep prior. The polyp was       multi-lobulated. Preparations were made for mucosal resection.       Demarcation of the lesion was performed during the procedure to clearly       identify the boundaries of the lesion. 8.5 mL of Eleview was injected       with adequate lift of the lesion from the muscularis propria. Snare       mucosal resection was performed in a piecemeal fashion. Resection and       retrieval were complete. Resected tissue margins were examined and clear       of polyp tissue. Resected tissue margins were then treated with hot       coagulation with tip of snare. There was no bleeding at the end of the       procedure. Impression:               - Patent side-to-side ileo-colonic anastomosis,                            characterized by congestion, edema and erythema.                            Biopsied.                           - One 7 mm polyp at the anastomosis, removed with a                            hot snare. Resected and retrieved. Clip (MR                            conditional) was placed. Clip manufacturer: [employer].                           - One 25 mm polyp in the sigmoid colon, removed                             with mucosal resection. Resected and retrieved.                           - Mucosal resection was performed. Resection and                            retrieval were complete. Moderate Sedation:      Per Anesthesia Care Recommendation:           - Patient has a contact number available for                            emergencies. The signs and symptoms of potential                            delayed complications were discussed with the                            patient. Return to normal activities tomorrow.                            Written discharge instructions were provided to the                            patient.                           - Resume previous diet.                           - Continue present medications.                           - Await pathology results.                           - Repeat colonoscopy in 6 months for surveillance.                           - Return to GI clinic PRN. Procedure Code(s):        --- Professional ---                           [phone], Colonoscopy, flexible; with endoscopic                            mucosal resection                           45385, 59, Colonoscopy, flexible; with removal of  tumor(s), polyp(s), or other lesion(s) by snare                            technique                           45380, 51, Colonoscopy, flexible; with biopsy,                            single or multiple Diagnosis Code(s):        --- Professional ---                           T26.712, Personal history of other malignant                            neoplasm of large intestine                           Z98.0, Intestinal bypass and anastomosis status                           D12.6, Benign neoplasm of colon, unspecified                           D12.5, Benign neoplasm of sigmoid colon CPT copyright 2022 American Medical Association. All rights reserved. The codes documented in this report are preliminary and upon  coder review may  be revised to meet current compliance requirements. Elon Alas. Abbey Chatters, DO Bally Abbey Chatters, DO 01/24/2022 1:47:39 PM This report has been signed electronically. Number of Addenda: 0

## 2022-01-24 NOTE — Telephone Encounter (Signed)
Patient needs repeat colonoscopy in 6 months, ASA 2, she would like to try different prep something smaller volume.  Can we use Clenpiq?  Diagnosis for procedure, personal history of colon cancer, personal history of adenomatous colon polyp  Thank you

## 2022-01-24 NOTE — Anesthesia Postprocedure Evaluation (Signed)
Anesthesia Post Note  Patient: Alison Ramsey  Procedure(s) Performed: COLONOSCOPY WITH PROPOFOL POLYPECTOMY BIOPSY HEMOSTASIS CLIP PLACEMENT SUBMUCOSAL LIFTING INJECTION  Patient location during evaluation: PACU Anesthesia Type: General Level of consciousness: awake and alert Pain management: pain level controlled Vital Signs Assessment: post-procedure vital signs reviewed and stable Respiratory status: spontaneous breathing, nonlabored ventilation, respiratory function stable and patient connected to nasal cannula oxygen Cardiovascular status: stable and blood pressure returned to baseline Postop Assessment: no apparent nausea or vomiting Anesthetic complications: no   No notable events documented.   Last Vitals:  Vitals:   01/24/22 1155 01/24/22 1336  BP: (!) 161/101 122/75  Pulse: 83 69  Resp: 18 20  Temp: 37.1 C 36.4 C  SpO2: 98% 98%    Last Pain:  Vitals:   01/24/22 1336  TempSrc: Oral  PainSc: 0-No pain                 Nicanor Alcon

## 2022-01-25 LAB — CEA: CEA: 1.5 ng/mL (ref 0.0–4.7)

## 2022-01-27 ENCOUNTER — Other Ambulatory Visit (HOSPITAL_COMMUNITY): Payer: Self-pay | Admitting: Family

## 2022-01-27 DIAGNOSIS — Z1231 Encounter for screening mammogram for malignant neoplasm of breast: Secondary | ICD-10-CM

## 2022-01-27 LAB — SURGICAL PATHOLOGY

## 2022-01-27 NOTE — Telephone Encounter (Signed)
Manuela Schwartz can you add to recall thanks!

## 2022-01-29 ENCOUNTER — Encounter (HOSPITAL_COMMUNITY): Payer: Self-pay | Admitting: Internal Medicine

## 2022-01-30 ENCOUNTER — Encounter (HOSPITAL_COMMUNITY): Payer: 59

## 2022-01-30 ENCOUNTER — Inpatient Hospital Stay: Payer: 59 | Admitting: Hematology

## 2022-01-30 VITALS — BP 167/108 | HR 81 | Temp 98.2°F | Resp 17 | Ht 64.5 in | Wt 231.6 lb

## 2022-01-30 DIAGNOSIS — Z1231 Encounter for screening mammogram for malignant neoplasm of breast: Secondary | ICD-10-CM

## 2022-01-30 DIAGNOSIS — C182 Malignant neoplasm of ascending colon: Secondary | ICD-10-CM

## 2022-01-30 DIAGNOSIS — C186 Malignant neoplasm of descending colon: Secondary | ICD-10-CM | POA: Diagnosis not present

## 2022-01-30 NOTE — Progress Notes (Signed)
Alison Ramsey, [place] [address]   CLINIC:  Medical Oncology/Hematology  PCP:  Alison Ramsey, [address] / [place] [address] [phone]   REASON FOR VISIT:  Follow-up for descending colon cancer  PRIOR THERAPY: Left hemicolectomy (04/15/2021): Moderately differentiated adenocarcinoma, grade 2, 2/35 lymph nodes positive, margins negative, PT3PN1B.  MMR preserved. 2.  4 cycles of CapeOx completed on 07/22/2021  NGS Results: not done  CURRENT THERAPY: Surveillance  BRIEF ONCOLOGIC HISTORY:  Oncology History  Colon cancer ([place])  05/09/2021 Initial Diagnosis   Colon cancer ([place])   05/20/2021 - 07/22/2021 Chemotherapy   Patient is on Treatment Plan : COLORECTAL Xelox (Capeox) q21d       CANCER STAGING:  Cancer Staging  Colon cancer [facility]) Staging form: Colon and Rectum, AJCC 8th Edition - Clinical stage from 05/09/2021: Stage IIIB (cT3, cN1b, cM0) - Unsigned   INTERVAL HISTORY:  [name], a 52 y.o. female, seen for follow-up of colon cancer.  Reports energy levels 100%.  Reports that when she takes Metamucil, her stool is normal consistency.  Otherwise it is loose.  She stopped taking iron tablet prior to her colonoscopy and has not started back yet.  Overall she is feeling very well.   REVIEW OF SYSTEMS:  Review of Systems  Constitutional:  Negative for appetite change and fatigue.  HENT:   Negative for mouth sores.   All other systems reviewed and are negative.   PAST MEDICAL/SURGICAL HISTORY:  Past Medical History:  Diagnosis Date   Cancer [facility])    Colon cancer ([place])    Hypertension    Lyme disease 2018   Past Surgical History:  Procedure Laterality Date   ABDOMINAL HYSTERECTOMY  2013   BIOPSY  04/13/2021   Procedure: BIOPSY;  Surgeon: Alison Harman, DO;  [facility];  Service: Endoscopy;;  descending colon mass   BIOPSY  01/24/2022   Procedure: BIOPSY;  Surgeon: Alison Harman, DO;   Location[facility];  Service: Endoscopy;;   COLONOSCOPY WITH PROPOFOL N/A 01/24/2022   Procedure: COLONOSCOPY WITH PROPOFOL;  Surgeon: Alison Harman, DO;  Location[facility];  Service: Endoscopy;  Laterality: N/A;  1:00 pm   FLEXIBLE SIGMOIDOSCOPY N/A 04/13/2021   Procedure: FLEXIBLE SIGMOIDOSCOPY;  Surgeon: Alison Harman, DO;  [facility];  Service: Endoscopy;  Laterality: N/A;   HEMOSTASIS CLIP PLACEMENT  01/24/2022   Procedure: HEMOSTASIS CLIP PLACEMENT;  Surgeon: Alison Harman, DO;  [facility];  Service: Endoscopy;;   PARTIAL COLECTOMY N/A 04/15/2021   Procedure: EXTENDED RIGHT HEMICOLECTOMY;  Surgeon: Alison Signs, MD;  Location: [facility] ORS;  Service: General;  Laterality: N/A;   POLYPECTOMY  01/24/2022   Procedure: POLYPECTOMY;  Surgeon: Alison Harman, DO;  [facility];  Service: Endoscopy;;   PORTACATH PLACEMENT Left 05/13/2021   Procedure: INSERTION PORT-A-CATH;  Surgeon: Alison Signs, MD;  Location: [facility] ORS;  Service: General;  Laterality: Left;   SUBMUCOSAL LIFTING INJECTION  01/24/2022   Procedure: SUBMUCOSAL LIFTING INJECTION;  Surgeon: Alison Harman, DO;  Location[facility];  Service: Endoscopy;;   WISDOM TOOTH EXTRACTION      SOCIAL HISTORY:  Social History   Socioeconomic History   Marital status: Married    Spouse name: Not on file   Number of children: Not on file   Years of education: Not on file   Highest education level: Not on  file  Occupational History   Not on file  Tobacco Use   Smoking status: Never   Smokeless tobacco: Never  Vaping Use   Vaping Use: Never used  Substance and Sexual Activity   Alcohol use: Yes    Comment: occasionally   Drug use: Yes    Types: Marijuana   Sexual activity: Yes  Other Topics Concern   Not on file  Social History Narrative   Not on file   Social Determinants of Health   Financial Resource Strain: Not on file  Food Insecurity: Not on file   Transportation Needs: Not on file  Physical Activity: Not on file  Stress: Not on file  Social Connections: Not on file  Intimate Partner Violence: Not on file    FAMILY HISTORY:  Family History  Problem Relation Age of Onset   Kidney disease Mother    Hypertension Mother    Breast cancer Mother    Stroke Father    Colon cancer Maternal Aunt        d. 72s   Other Maternal Aunt        pituitary cancer   Stomach cancer Maternal Uncle    Colon cancer Cousin        d. 30s; maternal first cousin   Breast cancer Paternal Aunt     CURRENT MEDICATIONS:  Current Outpatient Medications  Medication Sig Dispense Refill   acetaminophen (TYLENOL) 500 MG tablet Take 1,000 mg by mouth every 6 (six) hours as needed for mild pain or headache.     amLODipine (NORVASC) 10 MG tablet Take 1 tablet (10 mg total) by mouth daily. 30 tablet 2   ASHWAGANDHA PO Take 1,000 mg by mouth at bedtime.     aspirin EC 81 MG tablet Take 81 mg by mouth every evening. Swallow whole.     capecitabine (XELODA) 500 MG tablet Take 4 tablets (2,000 mg total) by mouth 2 (two) times daily after a meal. Take for 14 days, then hold for 7 days. Repeat every 21 days. 112 tablet 3   cetirizine (ZYRTEC) 10 MG tablet Take 10 mg by mouth at bedtime.     Cyanocobalamin (VITAMIN B-12) 5000 MCG TBDP Take 5,000 mcg by mouth at bedtime.     ferrous sulfate 325 (65 FE) MG tablet Take 325 mg by mouth every evening.     FIBER PO Take 5 g by mouth daily.     ibuprofen (ADVIL) 200 MG tablet Take 400 mg by mouth every 8 (eight) hours as needed for headache or mild pain.     Lactobacillus (ACIDOPHILUS PO) Take 1 capsule by mouth at bedtime.     loratadine (CLARITIN) 10 MG tablet Take 10 mg by mouth at bedtime.     MAGNESIUM PO Take 400 mg by mouth every evening.     metoprolol tartrate (LOPRESSOR) 50 MG tablet Take 1 tablet (50 mg total) by mouth 2 (two) times daily. 60 tablet 2   Multiple Vitamins-Minerals (ZINC PO) Take 1 tablet by  mouth at bedtime. One A Day     OXALIPLATIN IV Inject into the vein every 21 ( twenty-one) days.     VITAMIN D PO Take 1,000 Units by mouth at bedtime.     No current facility-administered medications for this visit.    ALLERGIES:  Allergies  Allergen Reactions   Aleve [Naproxen] Hives    Ok with ibuprofen   Codeine Hives, Nausea And Vomiting and Rash   Latex Rash  PHYSICAL EXAM:  Performance status (ECOG): 0 - Asymptomatic  Vitals:   01/30/22 1458  BP: (!) 167/108  Pulse: 81  Resp: 17  Temp: 98.2 F (36.8 C)  SpO2: 100%   Wt Readings from Last 3 Encounters:  01/30/22 231 lb 9.6 oz (105.1 kg)  01/24/22 236 lb 3.2 oz (107.1 kg)  01/16/22 236 lb 3.2 oz (107.1 kg)   Physical Exam Vitals reviewed.  Constitutional:      Appearance: Normal appearance. She is obese.  Cardiovascular:     Rate and Rhythm: Normal rate and regular rhythm.     Pulses: Normal pulses.     Heart sounds: Normal heart sounds.  Pulmonary:     Effort: Pulmonary effort is normal.     Breath sounds: Normal breath sounds.  Neurological:     General: No focal deficit present.     Mental Status: She is alert and oriented to person, place, and time.  Psychiatric:        Mood and Affect: Mood normal.        Behavior: Behavior normal.     LABORATORY DATA:  I have reviewed the labs as listed.     Latest Ref Rng & Units 01/23/2022    2:42 PM 10/22/2021    8:05 AM 07/22/2021    8:56 AM  CBC  WBC 4.0 - 10.5 K/uL 7.1  7.1  4.9   Hemoglobin 12.0 - 15.0 g/dL 13.9  12.9  10.7   Hematocrit 36.0 - 46.0 % 39.2  36.2  30.8   Platelets 150 - 400 K/uL 248  213  148       Latest Ref Rng & Units 01/23/2022    2:42 PM 10/22/2021    8:05 AM 07/22/2021    8:56 AM  CMP  Glucose 70 - 99 mg/dL 111  140  147   BUN 6 - 20 mg/dL '10  14  14   '$ Creatinine 0.44 - 1.00 mg/dL 0.90  0.83  0.81   Sodium 135 - 145 mmol/L 132  138  139   Potassium 3.5 - 5.1 mmol/L 3.8  3.9  3.8   Chloride 98 - 111 mmol/L 95  106  107    CO2 22 - 32 mmol/L '25  24  25   '$ Calcium 8.9 - 10.3 mg/dL 9.0  9.1  8.8   Total Protein 6.5 - 8.1 g/dL 7.6  7.1  6.7   Total Bilirubin 0.3 - 1.2 mg/dL 1.3  0.9  1.0   Alkaline Phos 38 - 126 U/L 86  84  80   AST 15 - 41 U/L 32  26  31   ALT 0 - 44 U/L 42  33  33     DIAGNOSTIC IMAGING:  I have independently reviewed the scans and discussed with the patient. No results found.   ASSESSMENT:  Stage IIIb (T3N1B) descending colon cancer: - Presentation with abdominal pain for few weeks to the ER.  History of IBS and had colonoscopy more than 10 years ago.  No weight loss. - CT CAP with contrast (04/12/2021): Obstructive 5 cm distal descending colon neoplasm with associated proximal stercoral colitis with no bowel perforation.  2 indeterminate right upper lobe lung nodules 1 calcified and 1 noncalcified measuring up to 5 mm.  2 approximately 5 mm hyperdense foci along the gallbladder lumen which may represent gallbladder polyp or stone. - Sigmoidoscopy (04/13/2021) fungating infiltrative obstructing large mass found in the descending colon. - Left hemicolectomy (04/15/2021): Moderately  differentiated adenocarcinoma, grade 2, 2/35 lymph nodes positive, margins negative, PT3PN1B.  MMR preserved. - Preoperative CEA (04/12/2021): 2.5. - Adjuvant chemotherapy with 3 months of CapeOx was recommended with close monitoring of right lung nodules on subsequent scans. - 4 cycles of CapeOx from 05/20/2021 through 07/22/2021    Social/family history: - She lives at home with her husband.  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  No chemical exposure.  Quit smoking at age 45. - Maternal aunt had brain tumor.  Mother had breast cancer.  Maternal aunt died of colon cancer in her 79s.  Maternal first cousin died of colon cancer in his 51s.  Maternal uncle had stomach cancer.  Paternal aunt had breast cancer.  Maternal grandfather had lung cancer.   PLAN:  Stage IIIb (T3N1B) descending colon cancer, MMR stable: -CTAP  on 10/22/2021 with no evidence of recurrence.  Diffuse hepatic steatosis. - Reviewed pathology report from colonoscopy on 01/24/2022.  Inflammatory polyps at the anastomotic site negative for dysplasia.  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 - She is planned for repeat colonoscopy in 6 months per patient. - Reviewed labs from 01/23/2022 which showed normal LFTs, CBC.  CEA was normal. - RTC 3 months for follow-up with repeat CTAP and CEA levels.   2.  Diarrhea: - Continue Metamucil which makes her stools normal consistency.  3.  Normocytic anemia: - She was taking iron tablet 3 times weekly since last visit.  She stopped it prior to colonoscopy on 01/24/2022 and has not started back yet. - Hemoglobin improved to 13.9.  I have recommended that she start back on iron 3 times weekly.   Orders placed this encounter:  No orders of the defined types were placed in this encounter.     Derek Jack, MD [place] [phone]

## 2022-01-30 NOTE — Patient Instructions (Signed)
[place]  Discharge Instructions  You were seen and examined today by Dr. Delton Coombes.  Dr. Delton Coombes discussed your most recent lab work which revealed that everything looks good and normal other that your sugar is slightly elevated.  Follow-up as scheduled in 3 months with labs and CT scan.    Thank you for choosing [facility] to provide your oncology and hematology care.   To afford each patient quality time with our provider, please arrive at least 15 minutes before your scheduled appointment time. You may need to reschedule your appointment if you arrive late (10 or more minutes). Arriving late affects you and other patients whose appointments are after yours.  Also, if you miss three or more appointments without notifying the office, you may be dismissed from the clinic at the provider's discretion.    Again, thank you for choosing [facility].  Our hope is that these requests will decrease the amount of time that you wait before being seen by our physicians.   If you have a lab appointment with the [facility] please come in thru the Main Entrance and check in at the main information desk.           _____________________________________________________________  Should you have questions after your visit to [facility], please contact our office at [phone] and follow the prompts.  Our office hours are 8:00 a.m. to 4:30 p.m. Monday - Thursday and 8:00 a.m. to 2:30 p.m. Friday.  Please note that voicemails left after 4:00 p.m. may not be returned until the following business day.  We are closed weekends and all major holidays.  You do have access to a nurse 24-7, just call the main number to the clinic [phone] and do not press any options, hold on the line and a nurse will answer the phone.    For prescription refill requests, have your pharmacy contact our office and allow 72 hours.     Masks are optional in the cancer centers. If you would like for your care team to wear a mask while they are taking care of you, please let them know. You may have one support person who is at least 52 years old accompany you for your appointments.

## 2022-03-28 ENCOUNTER — Encounter: Payer: Self-pay | Admitting: Family

## 2022-04-25 ENCOUNTER — Encounter: Payer: Self-pay | Admitting: Family

## 2022-04-25 ENCOUNTER — Other Ambulatory Visit (HOSPITAL_COMMUNITY)
Admission: RE | Admit: 2022-04-25 | Discharge: 2022-04-25 | Disposition: A | Discharge status: Home / Self Care | Payer: 59 | Source: Ambulatory Visit | Attending: Family | Admitting: Family

## 2022-04-25 ENCOUNTER — Ambulatory Visit (INDEPENDENT_AMBULATORY_CARE_PROVIDER_SITE_OTHER): Payer: 59 | Admitting: Family

## 2022-04-25 VITALS — BP 170/82 | HR 98 | Temp 97.6°F | Ht 64.5 in | Wt 237.0 lb

## 2022-04-25 DIAGNOSIS — I10 Essential (primary) hypertension: Secondary | ICD-10-CM

## 2022-04-25 DIAGNOSIS — Z01419 Encounter for gynecological examination (general) (routine) without abnormal findings: Secondary | ICD-10-CM

## 2022-04-25 DIAGNOSIS — Z0000 Encounter for general adult medical examination without abnormal findings: Secondary | ICD-10-CM

## 2022-04-25 DIAGNOSIS — Z01411 Encounter for gynecological examination (general) (routine) with abnormal findings: Secondary | ICD-10-CM

## 2022-04-25 DIAGNOSIS — Z1159 Encounter for screening for other viral diseases: Secondary | ICD-10-CM

## 2022-04-25 MED ORDER — LISINOPRIL 20 MG PO TABS: 20.0000 mg | ORAL_TABLET | Freq: Every day | ORAL | 3 refills | Status: DC

## 2022-04-25 NOTE — Progress Notes (Signed)
Subjective:    Patient ID: Alison Ramsey, female    DOB: 1970-10-24, 52 y.o.   MRN: [id number]  Chief Complaint  Patient presents with   Annual Exam   PT presents to the office today for CPE with pap. She has colon cancer and she completed chemo and followed by Oncologists every 3 months. Getting colonoscopy every 6 months.   Her BP is elevated today and not taking any BP medications.  Hypertension This is a chronic problem. The current episode started more than 1 year ago. The problem has been waxing and waning since onset. The problem is uncontrolled. Pertinent negatives include no malaise/fatigue, peripheral edema or shortness of breath. Risk factors for coronary artery disease include obesity. The current treatment provides moderate improvement.  Anemia Presents for follow-up visit. There has been no malaise/fatigue.  Gynecologic Exam The patient's pertinent negatives include no genital itching, genital odor, vaginal bleeding or vaginal discharge.      Review of Systems  Constitutional:  Negative for malaise/fatigue.  Respiratory:  Negative for shortness of breath.   Genitourinary:  Negative for vaginal discharge.  All other systems reviewed and are negative.      Objective:   Physical Exam Vitals reviewed.  Constitutional:      General: She is not in acute distress.    Appearance: She is well-developed. She is obese.  HENT:     Head: Normocephalic and atraumatic.     Right Ear: Tympanic membrane normal.     Left Ear: Tympanic membrane normal.  Eyes:     Pupils: Pupils are equal, round, and reactive to light.  Neck:     Thyroid: No thyromegaly.  Cardiovascular:     Rate and Rhythm: Normal rate and regular rhythm.     Heart sounds: Normal heart sounds. No murmur heard. Pulmonary:     Effort: Pulmonary effort is normal. No respiratory distress.     Breath sounds: Normal breath sounds. No wheezing.  Abdominal:     General: Bowel sounds are normal. There is no  distension.     Palpations: Abdomen is soft.     Tenderness: There is no abdominal tenderness.  Genitourinary:    Comments: Bimanual exam- no adnexal masses or tenderness, ovaries nonpalpable   Cervix flipped  Musculoskeletal:        General: No tenderness. Normal range of motion.     Cervical back: Normal range of motion and neck supple.  Skin:    General: Skin is warm and dry.  Neurological:     Mental Status: She is alert and oriented to person, place, and time.     Cranial Nerves: No cranial nerve deficit.     Deep Tendon Reflexes: Reflexes are normal and symmetric.  Psychiatric:        Behavior: Behavior normal.        Thought Content: Thought content normal.        Judgment: Judgment normal.       BP (!) 170/82   Pulse 98   Temp 97.6 F (36.4 C) (Temporal)   Ht 5' 4.5" (1.638 m)   Wt 237 lb (107.5 kg)   SpO2 97%   BMI 40.05 kg/m      Assessment & Plan:  Alison Ramsey comes in today with chief complaint of Annual Exam   Diagnosis and orders addressed:  1. Annual physical exam - CMP14+EGFR - Lipid panel - TSH - Hepatitis C antibody  2. Primary hypertension Will start Lisinopril 20 mg  -  Daily blood pressure log given with instructions on how to fill out and told to bring to next visit -Dash diet information given -Exercise encouraged - Stress Management  -Continue current meds -RTO in 2-4 weeks  - lisinopril (ZESTRIL) 20 MG tablet; Take 1 tablet (20 mg total) by mouth daily.  Dispense: 90 tablet; Refill: 3  3. Gynecologic exam normal - Cytology - PAP([place])  4. Need for hepatitis C screening test - Hepatitis C antibody   Labs pending Health Maintenance reviewed Diet and exercise encouraged  Follow up plan: 2-4 weeks for HTN  Jannifer Rodney, FNP

## 2022-04-25 NOTE — Patient Instructions (Signed)
Hypertension, Adult High blood pressure (hypertension) is when the force of blood pumping through the arteries is too strong. The arteries are the blood vessels that carry blood from the heart throughout the body. Hypertension forces the heart to work harder to pump blood and may cause arteries to become narrow or stiff. Untreated or uncontrolled hypertension can lead to a heart attack, heart failure, a stroke, kidney disease, and other problems. A blood pressure reading consists of a higher number over a lower number. Ideally, your blood pressure should be below 120/80. The first ("top") number is called the systolic pressure. It is a measure of the pressure in your arteries as your heart beats. The second ("bottom") number is called the diastolic pressure. It is a measure of the pressure in your arteries as the heart relaxes. What are the causes? The exact cause of this condition is not known. There are some conditions that result in high blood pressure. What increases the risk? Certain factors may make you more likely to develop high blood pressure. Some of these risk factors are under your control, including: Smoking. Not getting enough exercise or physical activity. Being overweight. Having too much fat, sugar, calories, or salt (sodium) in your diet. Drinking too much alcohol. Other risk factors include: Having a personal history of heart disease, diabetes, high cholesterol, or kidney disease. Stress. Having a family history of high blood pressure and high cholesterol. Having obstructive sleep apnea. Age. The risk increases with age. What are the signs or symptoms? High blood pressure may not cause symptoms. Very high blood pressure (hypertensive crisis) may cause: Headache. Fast or irregular heartbeats (palpitations). Shortness of breath. Nosebleed. Nausea and vomiting. Vision changes. Severe chest pain, dizziness, and seizures. How is this diagnosed? This condition is diagnosed by  measuring your blood pressure while you are seated, with your arm resting on a flat surface, your legs uncrossed, and your feet flat on the floor. The cuff of the blood pressure monitor will be placed directly against the skin of your upper arm at the level of your heart. Blood pressure should be measured at least twice using the same arm. Certain conditions can cause a difference in blood pressure between your right and left arms. If you have a high blood pressure reading during one visit or you have normal blood pressure with other risk factors, you may be asked to: Return on a different day to have your blood pressure checked again. Monitor your blood pressure at home for 1 week or longer. If you are diagnosed with hypertension, you may have other blood or imaging tests to help your health care provider understand your overall risk for other conditions. How is this treated? This condition is treated by making healthy lifestyle changes, such as eating healthy foods, exercising more, and reducing your alcohol intake. You may be referred for counseling on a healthy diet and physical activity. Your health care provider may prescribe medicine if lifestyle changes are not enough to get your blood pressure under control and if: Your systolic blood pressure is above 130. Your diastolic blood pressure is above 80. Your personal target blood pressure may vary depending on your medical conditions, your age, and other factors. Follow these instructions at home: Eating and drinking  Eat a diet that is high in fiber and potassium, and low in sodium, added sugar, and fat. An example of this eating plan is called the DASH diet. DASH stands for Dietary Approaches to Stop Hypertension. To eat this way: Eat   plenty of fresh fruits and vegetables. Try to fill one half of your plate at each meal with fruits and vegetables. Eat whole grains, such as whole-wheat pasta, brown rice, or whole-grain bread. Fill about one  fourth of your plate with whole grains. Eat or drink low-fat dairy products, such as skim milk or low-fat yogurt. Avoid fatty cuts of meat, processed or cured meats, and poultry with skin. Fill about one fourth of your plate with lean proteins, such as fish, chicken without skin, beans, eggs, or tofu. Avoid pre-made and processed foods. These tend to be higher in sodium, added sugar, and fat. Reduce your daily sodium intake. Many people with hypertension should eat less than 1,500 mg of sodium a day. Do not drink alcohol if: Your health care provider tells you not to drink. You are pregnant, may be pregnant, or are planning to become pregnant. If you drink alcohol: Limit how much you have to: 0-1 drink a day for women. 0-2 drinks a day for men. Know how much alcohol is in your drink. In the [place] one drink equals one 12 oz bottle of beer (355 mL), one 5 oz glass of wine (148 mL), or one 1 oz glass of hard liquor (44 mL). Lifestyle  Work with your health care provider to maintain a healthy body weight or to lose weight. Ask what an ideal weight is for you. Get at least 30 minutes of exercise that causes your heart to beat faster (aerobic exercise) most days of the week. Activities may include walking, swimming, or biking. Include exercise to strengthen your muscles (resistance exercise), such as Pilates or lifting weights, as part of your weekly exercise routine. Try to do these types of exercises for 30 minutes at least 3 days a week. Do not use any products that contain nicotine or tobacco. These products include cigarettes, chewing tobacco, and vaping devices, such as e-cigarettes. If you need help quitting, ask your health care provider. Monitor your blood pressure at home as told by your health care provider. Keep all follow-up visits. This is important. Medicines Take over-the-counter and prescription medicines only as told by your health care provider. Follow directions carefully. Blood  pressure medicines must be taken as prescribed. Do not skip doses of blood pressure medicine. Doing this puts you at risk for problems and can make the medicine less effective. Ask your health care provider about side effects or reactions to medicines that you should watch for. Contact a health care provider if you: Think you are having a reaction to a medicine you are taking. Have headaches that keep coming back (recurring). Feel dizzy. Have swelling in your ankles. Have trouble with your vision. Get help right away if you: Develop a severe headache or confusion. Have unusual weakness or numbness. Feel faint. Have severe pain in your chest or abdomen. Vomit repeatedly. Have trouble breathing. These symptoms may be an emergency. Get help right away. Call 911. Do not wait to see if the symptoms will go away. Do not drive yourself to the hospital. Summary Hypertension is when the force of blood pumping through your arteries is too strong. If this condition is not controlled, it may put you at risk for serious complications. Your personal target blood pressure may vary depending on your medical conditions, your age, and other factors. For most people, a normal blood pressure is less than 120/80. Hypertension is treated with lifestyle changes, medicines, or a combination of both. Lifestyle changes include losing weight, eating a healthy,   low-sodium diet, exercising more, and limiting alcohol. This information is not intended to replace advice given to you by your health care provider. Make sure you discuss any questions you have with your health care provider. Document Revised: 10/30/2020 Document Reviewed: 10/30/2020 Elsevier Patient Education  2023 [employer]  

## 2022-04-26 LAB — LIPID PANEL
Chol/HDL Ratio: 3.6 ratio (ref 0.0–4.4)
Cholesterol, Total: 167 mg/dL (ref 100–199)
HDL: 47 mg/dL (ref >39)
LDL Chol Calc (NIH): 91 mg/dL (ref 0–99)
Triglycerides: 169 mg/dL — ABNORMAL HIGH (ref 0–149)
VLDL Cholesterol Cal: 29 mg/dL (ref 5–40)

## 2022-04-26 LAB — CMP14+EGFR
ALT: 41 IU/L — ABNORMAL HIGH (ref 0–32)
AST: 28 IU/L (ref 0–40)
Albumin/Globulin Ratio: 1.7 (ref 1.2–2.2)
Albumin: 4.7 g/dL (ref 3.8–4.9)
Alkaline Phosphatase: 116 IU/L (ref 44–121)
BUN/Creatinine Ratio: 12 (ref 9–23)
BUN: 11 mg/dL (ref 6–24)
Bilirubin Total: 0.9 mg/dL (ref 0.0–1.2)
CO2: 25 mmol/L (ref 20–29)
Calcium: 9.9 mg/dL (ref 8.7–10.2)
Chloride: 98 mmol/L (ref 96–106)
Creatinine, Ser: 0.92 mg/dL (ref 0.57–1.00)
Globulin, Total: 2.7 g/dL (ref 1.5–4.5)
Glucose: 113 mg/dL — ABNORMAL HIGH (ref 70–99)
Potassium: 4.3 mmol/L (ref 3.5–5.2)
Sodium: 138 mmol/L (ref 134–144)
Total Protein: 7.4 g/dL (ref 6.0–8.5)
eGFR: 75 mL/min/{1.73_m2} (ref >59)

## 2022-04-26 LAB — HEPATITIS C ANTIBODY: Hep C Virus Ab: NONREACTIVE

## 2022-04-26 LAB — TSH: TSH: 2.34 u[IU]/mL (ref 0.450–4.500)

## 2022-05-01 ENCOUNTER — Ambulatory Visit (HOSPITAL_COMMUNITY)
Admission: RE | Admit: 2022-05-01 | Discharge: 2022-05-01 | Disposition: A | Discharge status: Home / Self Care | Payer: 59 | Source: Ambulatory Visit | Attending: Hematology | Admitting: Hematology

## 2022-05-01 ENCOUNTER — Inpatient Hospital Stay: Payer: 59 | Attending: Hematology

## 2022-05-01 ENCOUNTER — Other Ambulatory Visit (HOSPITAL_COMMUNITY): Payer: Self-pay | Admitting: Family

## 2022-05-01 VITALS — BP 159/113 | HR 83 | Resp 18

## 2022-05-01 DIAGNOSIS — I10 Essential (primary) hypertension: Secondary | ICD-10-CM

## 2022-05-01 DIAGNOSIS — Z85038 Personal history of other malignant neoplasm of large intestine: Secondary | ICD-10-CM | POA: Diagnosis not present

## 2022-05-01 DIAGNOSIS — Z1231 Encounter for screening mammogram for malignant neoplasm of breast: Secondary | ICD-10-CM

## 2022-05-01 DIAGNOSIS — D649 Anemia, unspecified: Secondary | ICD-10-CM | POA: Insufficient documentation

## 2022-05-01 DIAGNOSIS — C182 Malignant neoplasm of ascending colon: Secondary | ICD-10-CM

## 2022-05-01 DIAGNOSIS — Z95828 Presence of other vascular implants and grafts: Secondary | ICD-10-CM

## 2022-05-01 LAB — CBC WITH DIFFERENTIAL/PLATELET
Abs Immature Granulocytes: 0.06 10*3/uL (ref 0.00–0.07)
Basophils Absolute: 0 10*3/uL (ref 0.0–0.1)
Basophils Relative: 0 %
Eosinophils Absolute: 0.1 10*3/uL (ref 0.0–0.5)
Eosinophils Relative: 2 %
HCT: 36.1 % (ref 36.0–46.0)
Hemoglobin: 12.5 g/dL (ref 12.0–15.0)
Immature Granulocytes: 1 %
Lymphocytes Relative: 22 %
Lymphs Abs: 1.6 10*3/uL (ref 0.7–4.0)
MCH: 28.8 pg (ref 26.0–34.0)
MCHC: 34.6 g/dL (ref 30.0–36.0)
MCV: 83.2 fL (ref 80.0–100.0)
Monocytes Absolute: 0.4 10*3/uL (ref 0.1–1.0)
Monocytes Relative: 5 %
Neutro Abs: 5.1 10*3/uL (ref 1.7–7.7)
Neutrophils Relative %: 70 %
Platelets: 213 10*3/uL (ref 150–400)
RBC: 4.34 MIL/uL (ref 3.87–5.11)
RDW: 14.3 % (ref 11.5–15.5)
WBC: 7.3 10*3/uL (ref 4.0–10.5)
nRBC: 0 % (ref 0.0–0.2)

## 2022-05-01 LAB — CYTOLOGY - PAP: Diagnosis: NEGATIVE

## 2022-05-01 LAB — COMPREHENSIVE METABOLIC PANEL
ALT: 33 U/L (ref 0–44)
AST: 24 U/L (ref 15–41)
Albumin: 4.1 g/dL (ref 3.5–5.0)
Alkaline Phosphatase: 88 U/L (ref 38–126)
Anion gap: 8 (ref 5–15)
BUN: 11 mg/dL (ref 6–20)
CO2: 25 mmol/L (ref 22–32)
Calcium: 8.7 mg/dL — ABNORMAL LOW (ref 8.9–10.3)
Chloride: 102 mmol/L (ref 98–111)
Creatinine, Ser: 0.72 mg/dL (ref 0.44–1.00)
GFR, Estimated: >60 mL/min (ref >60)
Glucose, Bld: 116 mg/dL — ABNORMAL HIGH (ref 70–99)
Potassium: 3.4 mmol/L — ABNORMAL LOW (ref 3.5–5.1)
Sodium: 135 mmol/L (ref 135–145)
Total Bilirubin: 1.3 mg/dL — ABNORMAL HIGH (ref 0.3–1.2)
Total Protein: 7.1 g/dL (ref 6.5–8.1)

## 2022-05-01 MED ORDER — HEPARIN SOD (PORK) LOCK FLUSH 100 UNIT/ML IV SOLN
INTRAVENOUS | Status: AC
  Filled 2022-05-01: qty 5

## 2022-05-01 MED ORDER — SODIUM CHLORIDE 0.9% FLUSH
10.0000 mL | Freq: Once | INTRAVENOUS | Status: AC
  Administered 2022-05-01: 10 mL via INTRAVENOUS

## 2022-05-01 MED ORDER — IOHEXOL 300 MG/ML  SOLN
100.0000 mL | Freq: Once | INTRAMUSCULAR | Status: AC | PRN
  Administered 2022-05-01: 100 mL via INTRAVENOUS

## 2022-05-01 MED ORDER — IOHEXOL 9 MG/ML PO SOLN
ORAL | Status: AC
  Filled 2022-05-01: qty 500

## 2022-05-01 MED ORDER — CLONIDINE HCL 0.1 MG PO TABS
0.2000 mg | ORAL_TABLET | Freq: Once | ORAL | Status: AC
  Administered 2022-05-01: 0.2 mg via ORAL
  Filled 2022-05-01: qty 2

## 2022-05-01 NOTE — Patient Instructions (Signed)
[facility]  Discharge Instructions: Thank you for choosing [facility] to provide your oncology and hematology care.  If you have a lab appointment with the Cancer Center - please note that after April 8th, 2024, all labs will be drawn in the cancer center.  You do not have to check in or register with the main entrance as you have in the past but will complete your check-in in the cancer center.  Wear comfortable clothing and clothing appropriate for easy access to any Portacath or PICC line.   We strive to give you quality time with your provider. You may need to reschedule your appointment if you arrive late (15 or more minutes).  Arriving late affects you and other patients whose appointments are after yours.  Also, if you miss three or more appointments without notifying the office, you may be dismissed from the clinic at the provider's discretion.      For prescription refill requests, have your pharmacy contact our office and allow 72 hours for refills to be completed.    Today you received the following port flush and access for CT scan.    To help prevent nausea and vomiting after your treatment, we encourage you to take your nausea medication as directed.  BELOW ARE SYMPTOMS THAT SHOULD BE REPORTED IMMEDIATELY: *FEVER GREATER THAN 100.4 F (38 C) OR HIGHER *CHILLS OR SWEATING *NAUSEA AND VOMITING THAT IS NOT CONTROLLED WITH YOUR NAUSEA MEDICATION *UNUSUAL SHORTNESS OF BREATH *UNUSUAL BRUISING OR BLEEDING *URINARY PROBLEMS (pain or burning when urinating, or frequent urination) *BOWEL PROBLEMS (unusual diarrhea, constipation, pain near the anus) TENDERNESS IN MOUTH AND THROAT WITH OR WITHOUT PRESENCE OF ULCERS (sore throat, sores in mouth, or a toothache) UNUSUAL RASH, SWELLING OR PAIN  UNUSUAL VAGINAL DISCHARGE OR ITCHING   Items with * indicate a potential emergency and should be followed up as soon as possible or go to the Emergency  Department if any problems should occur.  Please show the CHEMOTHERAPY ALERT CARD or IMMUNOTHERAPY ALERT CARD at check-in to the Emergency Department and triage nurse.  Should you have questions after your visit or need to cancel or reschedule your appointment, please contact [facility] [phone]  and follow the prompts.  Office hours are 8:00 a.m. to 4:30 p.m. Monday - Friday. Please note that voicemails left after 4:00 p.m. may not be returned until the following business day.  We are closed weekends and major holidays. You have access to a nurse at all times for urgent questions. Please call the main number to the clinic [phone] and follow the prompts.  For any non-urgent questions, you may also contact your provider using MyChart. We now offer e-Visits for anyone 22 and older to request care online for non-urgent symptoms. For details visit [email or web address].   Also download the MyChart app! Go to the app store, search "MyChart", open the app, select [place], and log in with your MyChart username and password.

## 2022-05-01 NOTE — Progress Notes (Signed)
Patient presents today for port flush lab for CT scan. Patient's BP on arrival 162/112, 172/108 and 159/113. Patient scheduled for CT scan at 3:30. Per Dr. Ellin Saba give clonidine 0.2 mg and send down to CT.  Port flushed with good blood return noted. No bruising or swelling at site. Patient left accessed for CT scan. VVS stable with no signs or symptoms of distressed noted.

## 2022-05-02 ENCOUNTER — Telehealth (INDEPENDENT_AMBULATORY_CARE_PROVIDER_SITE_OTHER): Payer: 59 | Admitting: Family

## 2022-05-02 ENCOUNTER — Other Ambulatory Visit: Payer: Self-pay | Admitting: Family

## 2022-05-02 ENCOUNTER — Ambulatory Visit (HOSPITAL_COMMUNITY)
Admission: RE | Admit: 2022-05-02 | Discharge: 2022-05-02 | Disposition: A | Discharge status: Home / Self Care | Payer: 59 | Source: Ambulatory Visit | Attending: Family | Admitting: Family

## 2022-05-02 ENCOUNTER — Encounter (HOSPITAL_COMMUNITY): Payer: Self-pay

## 2022-05-02 ENCOUNTER — Inpatient Hospital Stay (HOSPITAL_COMMUNITY): Admission: RE | Admit: 2022-05-02 | Payer: 59 | Source: Ambulatory Visit

## 2022-05-02 ENCOUNTER — Encounter: Payer: Self-pay | Admitting: Family

## 2022-05-02 DIAGNOSIS — Z1231 Encounter for screening mammogram for malignant neoplasm of breast: Secondary | ICD-10-CM

## 2022-05-02 DIAGNOSIS — S40262A Insect bite (nonvenomous) of left shoulder, initial encounter: Secondary | ICD-10-CM | POA: Diagnosis not present

## 2022-05-02 DIAGNOSIS — W57XXXA Bitten or stung by nonvenomous insect and other nonvenomous arthropods, initial encounter: Secondary | ICD-10-CM | POA: Diagnosis not present

## 2022-05-02 MED ORDER — DOXYCYCLINE HYCLATE 100 MG PO TABS: 200.0000 mg | ORAL_TABLET | Freq: Once | ORAL | 0 refills | Status: AC

## 2022-05-02 MED ORDER — METRONIDAZOLE 500 MG PO TABS: 500.0000 mg | ORAL_TABLET | Freq: Two times a day (BID) | ORAL | 0 refills | Status: AC

## 2022-05-02 NOTE — Progress Notes (Signed)
Virtual Visit Consent   Alison Ramsey, you are scheduled for a virtual visit with a [facility] provider today. Just as with appointments in the office, your consent must be obtained to participate. Your consent will be active for this visit and any virtual visit you may have with one of our providers in the next 365 days. If you have a MyChart account, a copy of this consent can be sent to you electronically.  As this is a virtual visit, video technology does not allow for your provider to perform a traditional examination. This may limit your provider's ability to fully assess your condition. If your provider identifies any concerns that need to be evaluated in person or the need to arrange testing (such as labs, EKG, etc.), we will make arrangements to do so. Although advances in technology are sophisticated, we cannot ensure that it will always work on either your end or our end. If the connection with a video visit is poor, the visit may have to be switched to a telephone visit. With either a video or telephone visit, we are not always able to ensure that we have a secure connection.  By engaging in this virtual visit, you consent to the provision of healthcare and authorize for your insurance to be billed (if applicable) for the services provided during this visit. Depending on your insurance coverage, you may receive a charge related to this service.  I need to obtain your verbal consent now. Are you willing to proceed with your visit today? Alison Ramsey has provided verbal consent on 05/02/2022 for a virtual visit (video or telephone). Jannifer Rodney, FNP  Date: 05/02/2022 3:17 PM  Virtual Visit via Video Note   I, Jannifer Rodney, connected with  Alison Ramsey  ([id number], December 08, 1970) on 05/02/22 at  3:10 PM EDT by a video-enabled telemedicine application and verified that I am speaking with the correct person using two identifiers.  Location: Patient: Virtual Visit Location Patient:  Home Provider: Virtual Visit Location Provider: Home Office   I discussed the limitations of evaluation and management by telemedicine and the availability of in person appointments. The patient expressed understanding and agreed to proceed.    History of Present Illness: Alison Ramsey is a 52 y.o. who identifies as a female who was assigned female at birth, and is being seen today for tick bite on left shoulder. Reports she removed it 04/25/22. Reports the area is red, swollen. Reports mild fever and joint pain, N&V. However, that lasted one day and was more than likely viral GI because multiple people in family had stomach bug.   HPI: HPI  Problems:  Patient Active Problem List   Diagnosis Date Noted   Iron deficiency anemia 07/22/2021   Colon cancer (HCC) 05/09/2021   Bowel obstruction (HCC) 04/12/2021   Hypertension 07/04/2016    Allergies:  Allergies  Allergen Reactions   Aleve [Naproxen] Hives    Ok with ibuprofen   Codeine Hives, Nausea And Vomiting and Rash   Latex Rash   Medications:  Current Outpatient Medications:    doxycycline (VIBRA-TABS) 100 MG tablet, Take 2 tablets (200 mg total) by mouth once for 1 dose., Disp: 2 tablet, Rfl: 0   acetaminophen (TYLENOL) 500 MG tablet, Take 1,000 mg by mouth every 6 (six) hours as needed for mild pain or headache., Disp: , Rfl:    ASHWAGANDHA PO, Take 1,000 mg by mouth at bedtime., Disp: , Rfl:    aspirin EC 81 MG  tablet, Take 81 mg by mouth every evening. Swallow whole. (Patient not taking: Reported on 04/25/2022), Disp: , Rfl:    capecitabine (XELODA) 500 MG tablet, Take 4 tablets (2,000 mg total) by mouth 2 (two) times daily after a meal. Take for 14 days, then hold for 7 days. Repeat every 21 days., Disp: 112 tablet, Rfl: 3   cetirizine (ZYRTEC) 10 MG tablet, Take 10 mg by mouth at bedtime., Disp: , Rfl:    Cyanocobalamin (VITAMIN B-12) 5000 MCG TBDP, Take 5,000 mcg by mouth at bedtime., Disp: , Rfl:    ferrous sulfate 325 (65  FE) MG tablet, Take 325 mg by mouth every evening., Disp: , Rfl:    FIBER PO, Take 5 g by mouth daily., Disp: , Rfl:    ibuprofen (ADVIL) 200 MG tablet, Take 400 mg by mouth every 8 (eight) hours as needed for headache or mild pain., Disp: , Rfl:    Lactobacillus (ACIDOPHILUS PO), Take 1 capsule by mouth at bedtime., Disp: , Rfl:    lisinopril (ZESTRIL) 20 MG tablet, Take 1 tablet (20 mg total) by mouth daily., Disp: 90 tablet, Rfl: 3   loratadine (CLARITIN) 10 MG tablet, Take 10 mg by mouth at bedtime., Disp: , Rfl:    MAGNESIUM PO, Take 400 mg by mouth every evening., Disp: , Rfl:    metroNIDAZOLE (FLAGYL) 500 MG tablet, Take 1 tablet (500 mg total) by mouth 2 (two) times daily for 7 days., Disp: 14 tablet, Rfl: 0   Multiple Vitamins-Minerals (ZINC PO), Take 1 tablet by mouth at bedtime. One A Day, Disp: , Rfl:    OXALIPLATIN IV, Inject into the vein every 21 ( twenty-one) days. (Patient not taking: Reported on 04/25/2022), Disp: , Rfl:    VITAMIN D PO, Take 1,000 Units by mouth at bedtime., Disp: , Rfl:   Observations/Objective: Patient is well-developed, well-nourished in no acute distress.  Resting comfortably  at home.  Head is normocephalic, atraumatic.  No labored breathing.  Speech is clear and coherent with logical content.  Patient is alert and oriented at baseline.  Erythemas papule on left shoulder  Assessment and Plan: 1. Tick bite of left shoulder, initial encounter - doxycycline (VIBRA-TABS) 100 MG tablet; Take 2 tablets (200 mg total) by mouth once for 1 dose.  Dispense: 2 tablet; Refill: 0  -Pt to report any new fever, joint pain, or rash -Wear protective clothing while outside- Long sleeves and long pants -Put insect repellent on all exposed skin and along clothing -Take a shower as soon as possible after being outside -[place] area and let us know if symptoms worsen or do not improve   Follow Up Instructions: I discussed the assessment and treatment plan with the  patient. The patient was provided an opportunity to ask questions and all were answered. The patient agreed with the plan and demonstrated an understanding of the instructions.  A copy of instructions were sent to the patient via MyChart unless otherwise noted below.     The patient was advised to call back or seek an in-person evaluation if the symptoms worsen or if the condition fails to improve as anticipated.  Time:  I spent 8 minutes with the patient via telehealth technology discussing the above problems/concerns.    Jannifer Rodney, FNP

## 2022-05-03 LAB — CEA: CEA: 1.7 ng/mL (ref 0.0–4.7)

## 2022-05-08 ENCOUNTER — Inpatient Hospital Stay: Payer: 59 | Admitting: Hematology

## 2022-05-13 ENCOUNTER — Ambulatory Visit: Payer: 59 | Admitting: Family

## 2022-05-13 ENCOUNTER — Encounter: Payer: Self-pay | Admitting: Family

## 2022-05-13 VITALS — BP 146/94 | HR 83 | Temp 97.4°F | Ht 64.5 in | Wt 237.4 lb

## 2022-05-13 DIAGNOSIS — I10 Essential (primary) hypertension: Secondary | ICD-10-CM

## 2022-05-13 MED ORDER — LISINOPRIL 40 MG PO TABS: 40.0000 mg | ORAL_TABLET | Freq: Every day | ORAL | 3 refills | Status: DC

## 2022-05-13 NOTE — Patient Instructions (Signed)
Hypertension, Adult High blood pressure (hypertension) is when the force of blood pumping through the arteries is too strong. The arteries are the blood vessels that carry blood from the heart throughout the body. Hypertension forces the heart to work harder to pump blood and may cause arteries to become narrow or stiff. Untreated or uncontrolled hypertension can lead to a heart attack, heart failure, a stroke, kidney disease, and other problems. A blood pressure reading consists of a higher number over a lower number. Ideally, your blood pressure should be below 120/80. The first ("top") number is called the systolic pressure. It is a measure of the pressure in your arteries as your heart beats. The second ("bottom") number is called the diastolic pressure. It is a measure of the pressure in your arteries as the heart relaxes. What are the causes? The exact cause of this condition is not known. There are some conditions that result in high blood pressure. What increases the risk? Certain factors may make you more likely to develop high blood pressure. Some of these risk factors are under your control, including: Smoking. Not getting enough exercise or physical activity. Being overweight. Having too much fat, sugar, calories, or salt (sodium) in your diet. Drinking too much alcohol. Other risk factors include: Having a personal history of heart disease, diabetes, high cholesterol, or kidney disease. Stress. Having a family history of high blood pressure and high cholesterol. Having obstructive sleep apnea. Age. The risk increases with age. What are the signs or symptoms? High blood pressure may not cause symptoms. Very high blood pressure (hypertensive crisis) may cause: Headache. Fast or irregular heartbeats (palpitations). Shortness of breath. Nosebleed. Nausea and vomiting. Vision changes. Severe chest pain, dizziness, and seizures. How is this diagnosed? This condition is diagnosed by  measuring your blood pressure while you are seated, with your arm resting on a flat surface, your legs uncrossed, and your feet flat on the floor. The cuff of the blood pressure monitor will be placed directly against the skin of your upper arm at the level of your heart. Blood pressure should be measured at least twice using the same arm. Certain conditions can cause a difference in blood pressure between your right and left arms. If you have a high blood pressure reading during one visit or you have normal blood pressure with other risk factors, you may be asked to: Return on a different day to have your blood pressure checked again. Monitor your blood pressure at home for 1 week or longer. If you are diagnosed with hypertension, you may have other blood or imaging tests to help your health care provider understand your overall risk for other conditions. How is this treated? This condition is treated by making healthy lifestyle changes, such as eating healthy foods, exercising more, and reducing your alcohol intake. You may be referred for counseling on a healthy diet and physical activity. Your health care provider may prescribe medicine if lifestyle changes are not enough to get your blood pressure under control and if: Your systolic blood pressure is above 130. Your diastolic blood pressure is above 80. Your personal target blood pressure may vary depending on your medical conditions, your age, and other factors. Follow these instructions at home: Eating and drinking  Eat a diet that is high in fiber and potassium, and low in sodium, added sugar, and fat. An example of this eating plan is called the DASH diet. DASH stands for Dietary Approaches to Stop Hypertension. To eat this way: Eat   plenty of fresh fruits and vegetables. Try to fill one half of your plate at each meal with fruits and vegetables. Eat whole grains, such as whole-wheat pasta, brown rice, or whole-grain bread. Fill about one  fourth of your plate with whole grains. Eat or drink low-fat dairy products, such as skim milk or low-fat yogurt. Avoid fatty cuts of meat, processed or cured meats, and poultry with skin. Fill about one fourth of your plate with lean proteins, such as fish, chicken without skin, beans, eggs, or tofu. Avoid pre-made and processed foods. These tend to be higher in sodium, added sugar, and fat. Reduce your daily sodium intake. Many people with hypertension should eat less than 1,500 mg of sodium a day. Do not drink alcohol if: Your health care provider tells you not to drink. You are pregnant, may be pregnant, or are planning to become pregnant. If you drink alcohol: Limit how much you have to: 0-1 drink a day for women. 0-2 drinks a day for men. Know how much alcohol is in your drink. In the [place] one drink equals one 12 oz bottle of beer (355 mL), one 5 oz glass of wine (148 mL), or one 1 oz glass of hard liquor (44 mL). Lifestyle  Work with your health care provider to maintain a healthy body weight or to lose weight. Ask what an ideal weight is for you. Get at least 30 minutes of exercise that causes your heart to beat faster (aerobic exercise) most days of the week. Activities may include walking, swimming, or biking. Include exercise to strengthen your muscles (resistance exercise), such as Pilates or lifting weights, as part of your weekly exercise routine. Try to do these types of exercises for 30 minutes at least 3 days a week. Do not use any products that contain nicotine or tobacco. These products include cigarettes, chewing tobacco, and vaping devices, such as e-cigarettes. If you need help quitting, ask your health care provider. Monitor your blood pressure at home as told by your health care provider. Keep all follow-up visits. This is important. Medicines Take over-the-counter and prescription medicines only as told by your health care provider. Follow directions carefully. Blood  pressure medicines must be taken as prescribed. Do not skip doses of blood pressure medicine. Doing this puts you at risk for problems and can make the medicine less effective. Ask your health care provider about side effects or reactions to medicines that you should watch for. Contact a health care provider if you: Think you are having a reaction to a medicine you are taking. Have headaches that keep coming back (recurring). Feel dizzy. Have swelling in your ankles. Have trouble with your vision. Get help right away if you: Develop a severe headache or confusion. Have unusual weakness or numbness. Feel faint. Have severe pain in your chest or abdomen. Vomit repeatedly. Have trouble breathing. These symptoms may be an emergency. Get help right away. Call 911. Do not wait to see if the symptoms will go away. Do not drive yourself to the hospital. Summary Hypertension is when the force of blood pumping through your arteries is too strong. If this condition is not controlled, it may put you at risk for serious complications. Your personal target blood pressure may vary depending on your medical conditions, your age, and other factors. For most people, a normal blood pressure is less than 120/80. Hypertension is treated with lifestyle changes, medicines, or a combination of both. Lifestyle changes include losing weight, eating a healthy,   low-sodium diet, exercising more, and limiting alcohol. This information is not intended to replace advice given to you by your health care provider. Make sure you discuss any questions you have with your health care provider. Document Revised: 10/30/2020 Document Reviewed: 10/30/2020 Elsevier Patient Education  2023 [employer]  

## 2022-05-13 NOTE — Progress Notes (Signed)
   Subjective:    Patient ID: Alison Ramsey, female    DOB: [birth year], 52 y.o.   MRN: [id number]  Chief Complaint  Patient presents with   Hypertension   Pt presents to the office today to follow up on on HTN. She was seen on 04/25/22 and started on Lisinopril 20 mg. Her BP is not at goal.  Hypertension This is a chronic problem. The current episode started more than 1 year ago. The problem is unchanged. The problem is uncontrolled. Pertinent negatives include no malaise/fatigue, peripheral edema or shortness of breath. Risk factors for coronary artery disease include obesity, dyslipidemia and sedentary lifestyle. Past treatments include ACE inhibitors. The current treatment provides moderate improvement.      Review of Systems  Constitutional:  Negative for malaise/fatigue.  Respiratory:  Negative for shortness of breath.   All other systems reviewed and are negative.      Objective:   Physical Exam Vitals reviewed.  Constitutional:      General: She is not in acute distress.    Appearance: She is well-developed. She is obese.  HENT:     Head: Normocephalic and atraumatic.     Right Ear: Tympanic membrane normal.     Left Ear: Tympanic membrane normal.  Eyes:     Pupils: Pupils are equal, round, and reactive to light.  Neck:     Thyroid: No thyromegaly.  Cardiovascular:     Rate and Rhythm: Normal rate and regular rhythm.     Heart sounds: Normal heart sounds. No murmur heard. Pulmonary:     Effort: Pulmonary effort is normal. No respiratory distress.     Breath sounds: Normal breath sounds. No wheezing.  Abdominal:     General: Bowel sounds are normal. There is no distension.     Palpations: Abdomen is soft.     Tenderness: There is no abdominal tenderness.  Musculoskeletal:        General: No tenderness. Normal range of motion.     Cervical back: Normal range of motion and neck supple.  Skin:    General: Skin is warm and dry.  Neurological:     Mental Status:  She is alert and oriented to person, place, and time.     Cranial Nerves: No cranial nerve deficit.     Deep Tendon Reflexes: Reflexes are normal and symmetric.  Psychiatric:        Behavior: Behavior normal.        Thought Content: Thought content normal.        Judgment: Judgment normal.       BP (!) 146/94   Pulse 83   Temp (!) 97.4 F (36.3 C) (Temporal)   Ht 5' 4.5" (1.638 m)   Wt 237 lb 6.4 oz (107.7 kg)   SpO2 97%   BMI 40.12 kg/m      Assessment & Plan:   Alison Ramsey comes in today with chief complaint of Hypertension   Diagnosis and orders addressed:  1. Primary hypertension -Increase Lisinopril 40 mg  -Daily blood pressure log given with instructions on how to fill out and told to bring to next visit -Dash diet information given -Exercise encouraged - Stress Management  -Continue current meds -RTO in 2 weeks  - lisinopril (ZESTRIL) 40 MG tablet; Take 1 tablet (40 mg total) by mouth daily.  Dispense: 90 tablet; Refill: 3 - BMP8+EGFR   Jannifer Rodney, FNP

## 2022-05-14 LAB — BMP8+EGFR
BUN/Creatinine Ratio: 10 (ref 9–23)
BUN: 9 mg/dL (ref 6–24)
CO2: 21 mmol/L (ref 20–29)
Calcium: 9.9 mg/dL (ref 8.7–10.2)
Chloride: 100 mmol/L (ref 96–106)
Creatinine, Ser: 0.87 mg/dL (ref 0.57–1.00)
Glucose: 134 mg/dL — ABNORMAL HIGH (ref 70–99)
Potassium: 4.8 mmol/L (ref 3.5–5.2)
Sodium: 138 mmol/L (ref 134–144)
eGFR: 81 mL/min/{1.73_m2} (ref >59)

## 2022-05-27 ENCOUNTER — Inpatient Hospital Stay: Payer: 59 | Attending: Hematology | Admitting: Hematology

## 2022-05-27 ENCOUNTER — Inpatient Hospital Stay: Payer: 59

## 2022-05-27 VITALS — BP 154/114 | HR 110 | Temp 97.8°F | Resp 19 | Wt 236.5 lb

## 2022-05-27 DIAGNOSIS — C186 Malignant neoplasm of descending colon: Secondary | ICD-10-CM

## 2022-05-27 DIAGNOSIS — Z85038 Personal history of other malignant neoplasm of large intestine: Secondary | ICD-10-CM | POA: Diagnosis present

## 2022-05-27 DIAGNOSIS — D649 Anemia, unspecified: Secondary | ICD-10-CM | POA: Diagnosis not present

## 2022-05-27 DIAGNOSIS — C182 Malignant neoplasm of ascending colon: Secondary | ICD-10-CM | POA: Diagnosis not present

## 2022-05-27 LAB — GENETIC SCREENING ORDER

## 2022-05-27 NOTE — Progress Notes (Signed)
[facility] [address], [place] [address]    Clinic Day:  05/27/2022  Referring physician: Junie Spencer, FNP  Patient Care Team: Junie Spencer, FNP as PCP - General (Family Medicine) [name], MD as Medical Oncologist (Medical Oncology) Therese Sarah, RN as Oncology Nurse Navigator (Medical Oncology)   ASSESSMENT & PLAN:   Assessment: 1. Stage IIIb (T3N1B) descending colon cancer: - Presentation with abdominal pain for few weeks to the ER.  History of IBS and had colonoscopy more than 10 years ago.  No weight loss. - CT CAP with contrast (04/12/2021): Obstructive 5 cm distal descending colon neoplasm with associated proximal stercoral colitis with no bowel perforation.  2 indeterminate right upper lobe lung nodules 1 calcified and 1 noncalcified measuring up to 5 mm.  2 approximately 5 mm hyperdense foci along the gallbladder lumen which may represent gallbladder polyp or stone. - Sigmoidoscopy (04/13/2021) fungating infiltrative obstructing large mass found in the descending colon. - Left hemicolectomy (04/15/2021): Moderately differentiated adenocarcinoma, grade 2, 2/35 lymph nodes positive, margins negative, PT3PN1B.  MMR preserved. - Preoperative CEA (04/12/2021): 2.5. - Adjuvant chemotherapy with 3 months of CapeOx was recommended with close monitoring of right lung nodules on subsequent scans. - 4 cycles of CapeOx from 05/20/2021 through 07/22/2021   2. Social/family history: - She lives at home with her husband.  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  No chemical exposure.  Quit smoking at age 62. - Maternal aunt had brain tumor.  Mother had breast cancer.  Maternal aunt died of colon cancer in her 11s.  Maternal first cousin died of colon cancer in his 30s.  Maternal uncle had stomach cancer.  Paternal aunt had breast cancer.  Maternal grandfather had lung cancer.    Plan: 1. Stage IIIb (T3N1B) descending colon cancer, MMR stable: - She  denies any change in bowel habits or bleeding per rectum or melena. - Reviewed labs from 05/01/2022: CEA is 1.7.  LFTs are normal. - CTAP (05/01/2022): No evidence of recurrence or metastatic disease.  Diffuse hepatic steatosis.  Mild splenomegaly.  Left lower pole kidney stone. - We discussed Signatera testing for surveillance.  She is agreeable.  We will order the test.  She also wants to have genetic testing as she has insurance now. - RTC 3 months for follow-up with repeat labs.  2.  Diarrhea: - She takes psyllium husk which is making her stools normal consistency.  3.  Normocytic anemia: - Hemoglobin 12.5.  Continue iron tablet 3 times weekly.  Will check ferritin and iron panel in 3 months.    Orders Placed This Encounter  Procedures   Signatera    Select as applicable. If patient is on or planning to receive immunotherapy, select drug: Not on Immunotherapy If "Other or Multiple", Write down drug name:  Do not Delete Below This Line   ==========Department Information========== ID: [id number] Department: [facility] [address] [id number] [facility] [place] [address] Dept: [phone] Dept Fax: [phone]    Standing Status:   Future    Number of Occurrences:   1    Standing Expiration Date:   05/27/2023    Order Specific Question:   [name] to follow up with patient for sample collection (mobile phleb, lab, or saliva):    Answer:   No    Order Specific Question:   Surveillance Program draw frequency:    Answer:   MRD and Recurrence Monitoring  Order Specific Question:   Surveillance Program draw count:    Answer:   4    Order Specific Question:   Cancer type:    Answer:   Colon    Order Specific Question:   Stage of diagnosis:    Answer:   III    Order Specific Question:   History of recurrence?    Answer:   No    Order Specific Question:   Current disease status:    Answer:   No evidence of disease    Order  Specific Question:   Date of surgery (MM/DD/YYYY):    Answer:   04/15/2021    Order Specific Question:   Is the patient receiving or planning to receive immunotherapy?    Answer:   No    Order Specific Question:   Patient status:    Answer:   Outpatient    Order Specific Question:   Most recent progress/clinical note attached?    Answer:   Yes    Order Specific Question:   Pathology report attached?    Answer:   Yes    Order Specific Question:   Pathology institution name:    Answer:   [facility]    Order Specific Question:   Pathology institution address and fax number:    Answer:   Fax Number [phone]    Order Specific Question:   Tissue collection date (MM/DD/YYYY):    Answer:   04/15/2021    Order Specific Question:   By placing this electronic order I confirm the testing ordered herein is medically necessary and this patient has been informed of the details of the genetic test(s) ordered, including the risks, benefits, and alternatives, and has consented to testing.    Answer:   Yes    Order Specific Question:   What type of billing?    Answer:   Automatic Data    Order Specific Question:   Release to patient    Answer:   Immediate [1]   CBC    Standing Status:   Future    Standing Expiration Date:   05/27/2023   Comprehensive metabolic panel    Standing Status:   Future    Standing Expiration Date:   05/27/2023    Order Specific Question:   Release to patient    Answer:   Immediate   CEA    Standing Status:   Future    Standing Expiration Date:   05/27/2023   Ferritin    Standing Status:   Future    Standing Expiration Date:   05/27/2023    Order Specific Question:   Release to patient    Answer:   Immediate   Iron and TIBC    Standing Status:   Future    Standing Expiration Date:   05/27/2023    Order Specific Question:   Release to patient    Answer:   Immediate      I,Katie Daubenspeck,acting as a scribe for [name], MD.,have  documented all relevant documentation on the behalf of [name], MD,as directed by  [name], MD while in the presence of [name], MD.   I, [name] MD, have reviewed the above documentation for accuracy and completeness, and I agree with the above.   [name], MD   5/21/20245:12 PM  CHIEF COMPLAINT:   Diagnosis: descending colon cancer    Cancer Staging  Colon cancer [facility]) Staging form: Colon and Rectum, AJCC 8th Edition -  Clinical stage from 05/09/2021: Stage IIIB (cT3, cN1b, cM0) - Unsigned    Prior Therapy: 1. Left hemicolectomy (04/15/2021) 2.  4 cycles of CapeOx completed on 07/22/2021  Current Therapy:  surveillance   HISTORY OF PRESENT ILLNESS:   Oncology History  Colon cancer (HCC)  05/09/2021 Initial Diagnosis   Colon cancer (HCC)   05/20/2021 - 07/22/2021 Chemotherapy   Patient is on Treatment Plan : COLORECTAL Xelox (Capeox) q21d        INTERVAL HISTORY:   Alison [facility] is a 52 y.o. female presenting to clinic today for follow up of descending colon cancer. She was last seen by me on 01/30/22.  Since her last visit, she underwent restaging CT A/P on 05/01/22 showing: no evidence of local recurrence or abdominopelvic metastatic disease.  Today, she states that she is doing well overall. Her appetite level is at 100%. Her energy level is at 50%.  PAST MEDICAL HISTORY:   Past Medical History: Past Medical History:  Diagnosis Date   Cancer (HCC)    Colon cancer (HCC)    Hypertension    Lyme disease 2018    Surgical History: Past Surgical History:  Procedure Laterality Date   ABDOMINAL HYSTERECTOMY  2013   BIOPSY  04/13/2021   Procedure: BIOPSY;  Surgeon: Lanelle Bal, DO;  [facility];  Service: Endoscopy;;  descending colon mass   BIOPSY  01/24/2022   Procedure: BIOPSY;  Surgeon: Lanelle Bal, DO;  [facility];  Service: Endoscopy;;   COLONOSCOPY WITH PROPOFOL N/A 01/24/2022    Procedure: COLONOSCOPY WITH PROPOFOL;  Surgeon: Lanelle Bal, DO;  [facility];  Service: Endoscopy;  Laterality: N/A;  1:00 pm   FLEXIBLE SIGMOIDOSCOPY N/A 04/13/2021   Procedure: FLEXIBLE SIGMOIDOSCOPY;  Surgeon: Lanelle Bal, DO;  [facility];  Service: Endoscopy;  Laterality: N/A;   HEMOSTASIS CLIP PLACEMENT  01/24/2022   Procedure: HEMOSTASIS CLIP PLACEMENT;  Surgeon: Lanelle Bal, DO;  [facility];  Service: Endoscopy;;   PARTIAL COLECTOMY N/A 04/15/2021   Procedure: EXTENDED RIGHT HEMICOLECTOMY;  Surgeon: [name], MD;  Location: [facility];  Service: General;  Laterality: N/A;   POLYPECTOMY  01/24/2022   Procedure: POLYPECTOMY;  Surgeon: Lanelle Bal, DO;  [facility];  Service: Endoscopy;;   PORTACATH PLACEMENT Left 05/13/2021   Procedure: INSERTION PORT-A-CATH;  Surgeon: [name], MD;  Location: [facility];  Service: General;  Laterality: Left;   SUBMUCOSAL LIFTING INJECTION  01/24/2022   Procedure: SUBMUCOSAL LIFTING INJECTION;  Surgeon: Lanelle Bal, DO;  [facility];  Service: Endoscopy;;   WISDOM TOOTH EXTRACTION      Social History: Social History   Socioeconomic History   Marital status: Married    Spouse name: Not on file   Number of children: Not on file   Years of education: Not on file   Highest education level: Not on file  Occupational History   Not on file  Tobacco Use   Smoking status: Never   Smokeless tobacco: Never  Vaping Use   Vaping Use: Never used  Substance and Sexual Activity   Alcohol use: Yes    Comment: occasionally   Drug use: Yes    Types: Marijuana   Sexual activity: Yes  Other Topics Concern   Not on file  Social History Narrative   Not on file   Social Determinants of Health   Financial Resource Strain: Not on file  Food Insecurity:  Not on file  Transportation Needs: Not on file  Physical Activity: Not on file  Stress: Not on file  Social  Connections: Not on file  Intimate Partner Violence: Not on file    Family History: Family History  Problem Relation Age of Onset   Kidney disease Mother    Hypertension Mother    Breast cancer Mother    Stroke Father    Colon cancer Maternal Aunt        d. 54s   Other Maternal Aunt        pituitary cancer   Stomach cancer Maternal Uncle    Colon cancer Cousin        d. 30s; maternal first cousin   Breast cancer Paternal Aunt     Current Medications:  Current Outpatient Medications:    ASHWAGANDHA PO, Take 1,000 mg by mouth at bedtime., Disp: , Rfl:    cetirizine (ZYRTEC) 10 MG tablet, Take 10 mg by mouth at bedtime., Disp: , Rfl:    Cyanocobalamin (VITAMIN B-12) 5000 MCG TBDP, Take 5,000 mcg by mouth at bedtime., Disp: , Rfl:    ferrous sulfate 325 (65 FE) MG tablet, Take 325 mg by mouth every evening., Disp: , Rfl:    FIBER PO, Take 5 g by mouth daily., Disp: , Rfl:    Lactobacillus (ACIDOPHILUS PO), Take 1 capsule by mouth at bedtime., Disp: , Rfl:    lisinopril (ZESTRIL) 40 MG tablet, Take 1 tablet (40 mg total) by mouth daily., Disp: 90 tablet, Rfl: 3   loratadine (CLARITIN) 10 MG tablet, Take 10 mg by mouth at bedtime., Disp: , Rfl:    MAGNESIUM PO, Take 400 mg by mouth every evening., Disp: , Rfl:    Multiple Vitamins-Minerals (ZINC PO), Take 1 tablet by mouth at bedtime. One A Day, Disp: , Rfl:    VITAMIN D PO, Take 1,000 Units by mouth at bedtime., Disp: , Rfl:    Allergies: Allergies  Allergen Reactions   Aleve [Naproxen] Hives    Ok with ibuprofen   Codeine Hives, Nausea And Vomiting and Rash   Latex Rash    REVIEW OF SYSTEMS:   Review of Systems  Constitutional:  Negative for chills, fatigue and fever.  HENT:   Negative for lump/mass, mouth sores, nosebleeds, sore throat and trouble swallowing.   Eyes:  Negative for eye problems.  Respiratory:  Negative for cough and shortness of breath.   Cardiovascular:  Negative for chest pain, leg swelling and  palpitations.  Gastrointestinal:  Negative for abdominal pain, constipation, diarrhea, nausea and vomiting.  Genitourinary:  Negative for bladder incontinence, difficulty urinating, dysuria, frequency, hematuria and nocturia.   Musculoskeletal:  Negative for arthralgias, back pain, flank pain, myalgias and neck pain.  Skin:  Negative for itching and rash.  Neurological:  Negative for dizziness, headaches and numbness.  Hematological:  Does not bruise/bleed easily.  Psychiatric/Behavioral:  Negative for depression, sleep disturbance and suicidal ideas. The patient is not nervous/anxious.   All other systems reviewed and are negative.    VITALS:   Blood pressure (!) 154/114, pulse (!) 110, temperature 97.8 F (36.6 C), temperature source Oral, resp. rate 19, weight 236 lb 8 oz (107.3 kg), SpO2 98 %.  Wt Readings from Last 3 Encounters:  05/27/22 236 lb 8 oz (107.3 kg)  05/13/22 237 lb 6.4 oz (107.7 kg)  04/25/22 237 lb (107.5 kg)    Body mass index is 39.97 kg/m.  Performance status (ECOG): 0 - Asymptomatic  PHYSICAL EXAM:  Physical Exam Vitals and nursing note reviewed. Exam conducted with a chaperone present.  Constitutional:      Appearance: Normal appearance.  Cardiovascular:     Rate and Rhythm: Normal rate and regular rhythm.     Pulses: Normal pulses.     Heart sounds: Normal heart sounds.  Pulmonary:     Effort: Pulmonary effort is normal.     Breath sounds: Normal breath sounds.  Abdominal:     Palpations: Abdomen is soft. There is no hepatomegaly, splenomegaly or mass.     Tenderness: There is no abdominal tenderness.  Musculoskeletal:     Right lower leg: No edema.     Left lower leg: No edema.  Lymphadenopathy:     Cervical: No cervical adenopathy.     Right cervical: No superficial, deep or posterior cervical adenopathy.    Left cervical: No superficial, deep or posterior cervical adenopathy.     Upper Body:     Right upper body: No supraclavicular or  axillary adenopathy.     Left upper body: No supraclavicular or axillary adenopathy.  Neurological:     General: No focal deficit present.     Mental Status: She is alert and oriented to person, place, and time.  Psychiatric:        Mood and Affect: Mood normal.        Behavior: Behavior normal.     LABS:      Latest Ref Rng & Units 05/01/2022    2:00 PM 01/23/2022    2:42 PM 10/22/2021    8:05 AM  CBC  WBC 4.0 - 10.5 K/uL 7.3  7.1  7.1   Hemoglobin 12.0 - 15.0 g/dL 16.1  09.6  04.5   Hematocrit 36.0 - 46.0 % 36.1  39.2  36.2   Platelets 150 - 400 K/uL 213  248  213       Latest Ref Rng & Units 05/13/2022    3:38 PM 05/01/2022    2:00 PM 04/25/2022    2:58 PM  CMP  Glucose 70 - 99 mg/dL 409  811  914   BUN 6 - 24 mg/dL 9  11  11    Creatinine 0.57 - 1.00 mg/dL 7.82  9.56  2.13   Sodium 134 - 144 mmol/L 138  135  138   Potassium 3.5 - 5.2 mmol/L 4.8  3.4  4.3   Chloride 96 - 106 mmol/L 100  102  98   CO2 20 - 29 mmol/L 21  25  25    Calcium 8.7 - 10.2 mg/dL 9.9  8.7  9.9   Total Protein 6.5 - 8.1 g/dL  7.1  7.4   Total Bilirubin 0.3 - 1.2 mg/dL  1.3  0.9   Alkaline Phos 38 - 126 U/L  88  116   AST 15 - 41 U/L  24  28   ALT 0 - 44 U/L  33  41      Lab Results  Component Value Date   CEA1 1.7 05/01/2022   /  CEA  Date Value Ref Range Status  05/01/2022 1.7 0.0 - 4.7 ng/mL Final    Comment:    (NOTE)                             Nonsmokers          <3.9  Smokers             <5.6 Roche [employer] Electrochemiluminescence Immunoassay (ECLIA) Values obtained with different assay methods or kits cannot be used interchangeably.  Results cannot be interpreted as absolute evidence of the presence or absence of malignant disease. Performed At: [facility] [address] [place], [place] [id number] Jolene Schimke MD WU:[id number]    No results found for: "PSA1" No results found for: "CAN199" Lab Results  Component Value Date    CAN125 10.2 04/12/2021    No results found for: "TOTALPROTELP", "ALBUMINELP", "A1GS", "A2GS", "BETS", "BETA2SER", "GAMS", "MSPIKE", "SPEI" Lab Results  Component Value Date   TIBC 358 10/22/2021   TIBC 464 (H) 07/22/2021   TIBC 462 (H) 06/10/2021   FERRITIN 47 10/22/2021   FERRITIN 24 07/22/2021   FERRITIN 21 06/10/2021   IRONPCTSAT 19 10/22/2021   IRONPCTSAT 15 07/22/2021   IRONPCTSAT 9 (L) 06/10/2021   No results found for: "LDH"   STUDIES:   MM 3D SCREENING MAMMOGRAM BILATERAL BREAST  Result Date: 05/05/2022 CLINICAL DATA:  Screening. EXAM: DIGITAL SCREENING BILATERAL MAMMOGRAM WITH TOMOSYNTHESIS AND CAD TECHNIQUE: Bilateral screening digital craniocaudal and mediolateral oblique mammograms were obtained. Bilateral screening digital breast tomosynthesis was performed. The images were evaluated with computer-aided detection. COMPARISON:  Previous exam(s). ACR Breast Density Category b: There are scattered areas of fibroglandular density. FINDINGS: There are no findings suspicious for malignancy. IMPRESSION: No mammographic evidence of malignancy. A result letter of this screening mammogram will be mailed directly to the patient. RECOMMENDATION: Screening mammogram in one year. (Code:SM-B-01Y) BI-RADS CATEGORY  1: Negative. Electronically Signed   By: Baird Lyons M.D.   On: 05/05/2022 14:06   CT Abdomen Pelvis W Contrast  Result Date: 05/02/2022 CLINICAL DATA:  Colon cancer, monitor.  * Tracking Code: BO * EXAM: CT ABDOMEN AND PELVIS WITH CONTRAST TECHNIQUE: Multidetector CT imaging of the abdomen and pelvis was performed using the standard protocol following bolus administration of intravenous contrast. RADIATION DOSE REDUCTION: This exam was performed according to the departmental dose-optimization program which includes automated exposure control, adjustment of the mA and/or kV according to patient size and/or use of iterative reconstruction technique. CONTRAST:  OMNIPAQUE IOHEXOL  300 MG/ML  SOLN COMPARISON:  Multiple priors including most recent CT October 22, 2021 FINDINGS: Lower chest: No acute abnormality. Hepatobiliary: Diffuse hepatic steatosis with focal areas of fatty sparing. No suspicious hepatic lesion. Gallbladder is unremarkable. No biliary ductal dilation. Pancreas: No pancreatic ductal dilation or evidence of acute inflammation. Spleen: Similar mild splenomegaly measuring 15 cm in maximum axial dimension. Adrenals/Urinary Tract: Bilateral adrenal glands appear normal. No hydronephrosis. Kidneys demonstrate symmetric enhancement and excretion of contrast material. Punctate nonobstructive left lower pole renal stone. Urinary bladder is unremarkable for degree of distension. Stomach/Bowel: Similar changes of prior subtotal colectomy with anastomotic sutures in the anterior pelvis common no new nodularity along the suture line. Radiopaque enteric contrast material traverses mid sigmoid colon. Stomach is unremarkable for degree of distension. No pathologic dilation of small or large bowel. Vascular/Lymphatic: Normal caliber abdominal aorta. Smooth IVC contours. No pathologically enlarged abdominal or pelvic lymph nodes. Reproductive: Status post hysterectomy. No adnexal masses. Other: Trace mesenteric and pelvic fluid is similar prior and favored posttreatment related. No discrete peritoneal or omental nodularity. Postsurgical change in the abdominal wall. Surgical clips in the right lower quadrant. Musculoskeletal: No aggressive lytic or blastic lesion of bone. IMPRESSION: 1. Similar changes of prior subtotal colectomy without evidence of local recurrence or abdominopelvic metastatic disease.  2. Diffuse hepatic steatosis with focal areas of fatty sparing. 3. Similar mild splenomegaly. 4. Punctate nonobstructive left lower pole renal stone. Electronically Signed   By: Maudry Mayhew M.D.   On: 05/02/2022 05:51

## 2022-05-27 NOTE — Patient Instructions (Signed)
[facility]  Discharge Instructions  You were seen and examined today by Dr. Ellin Saba.  Dr. Ellin Saba discussed your most recent lab work which revealed that everything looks stable.  Dr. Ellin Saba is going to send your labs today and request that your tissue sample be sent to [facility] for mutation testing.   Follow-up as scheduled in 3 months.    Thank you for choosing [facility] to provide your oncology and hematology care.   To afford each patient quality time with our provider, please arrive at least 15 minutes before your scheduled appointment time. You may need to reschedule your appointment if you arrive late (10 or more minutes). Arriving late affects you and other patients whose appointments are after yours.  Also, if you miss three or more appointments without notifying the office, you may be dismissed from the clinic at the provider's discretion.    Again, thank you for choosing [facility].  Our hope is that these requests will decrease the amount of time that you wait before being seen by our physicians.   If you have a lab appointment with the Cancer Center - please note that after April 8th, all labs will be drawn in the cancer center.  You do not have to check in or register with the main entrance as you have in the past but will complete your check-in at the cancer center.            _____________________________________________________________  Should you have questions after your visit to [facility], please contact our office at [phone] and follow the prompts.  Our office hours are 8:00 a.m. to 4:30 p.m. Monday - Thursday and 8:00 a.m. to 2:30 p.m. Friday.  Please note that voicemails left after 4:00 p.m. may not be returned until the following business day.  We are closed weekends and all major holidays.  You do have access to a nurse 24-7, just call the main number to the clinic  [phone] and do not press any options, hold on the line and a nurse will answer the phone.    For prescription refill requests, have your pharmacy contact our office and allow 72 hours.    Masks are no longer required in the cancer centers. If you would like for your care team to wear a mask while they are taking care of you, please let them know. You may have one support person who is at least 52 years old accompany you for your appointments.

## 2022-05-29 ENCOUNTER — Encounter: Payer: Self-pay | Admitting: *Deleted

## 2022-06-10 ENCOUNTER — Ambulatory Visit: Payer: 59 | Admitting: Family

## 2022-06-12 ENCOUNTER — Encounter: Payer: Self-pay | Admitting: Family

## 2022-06-12 ENCOUNTER — Ambulatory Visit: Payer: 59 | Admitting: Family

## 2022-06-12 VITALS — BP 157/102 | HR 78 | Temp 97.4°F | Ht 64.5 in | Wt 237.0 lb

## 2022-06-12 DIAGNOSIS — I10 Essential (primary) hypertension: Secondary | ICD-10-CM

## 2022-06-12 LAB — BMP8+EGFR
BUN/Creatinine Ratio: 9 (ref 9–23)
BUN: 8 mg/dL (ref 6–24)
CO2: 24 mmol/L (ref 20–29)
Calcium: 9.6 mg/dL (ref 8.7–10.2)
Chloride: 100 mmol/L (ref 96–106)
Creatinine, Ser: 0.91 mg/dL (ref 0.57–1.00)
Glucose: 145 mg/dL — ABNORMAL HIGH (ref 70–99)
Potassium: 5 mmol/L (ref 3.5–5.2)
Sodium: 137 mmol/L (ref 134–144)
eGFR: 76 mL/min/{1.73_m2} (ref >59)

## 2022-06-12 MED ORDER — HYDROCHLOROTHIAZIDE 25 MG PO TABS
25.0000 mg | ORAL_TABLET | Freq: Every day | ORAL | 3 refills | Status: DC
Start: 2022-06-12 — End: 2022-09-05

## 2022-06-12 NOTE — Patient Instructions (Signed)
Hypertension, Adult High blood pressure (hypertension) is when the force of blood pumping through the arteries is too strong. The arteries are the blood vessels that carry blood from the heart throughout the body. Hypertension forces the heart to work harder to pump blood and may cause arteries to become narrow or stiff. Untreated or uncontrolled hypertension can lead to a heart attack, heart failure, a stroke, kidney disease, and other problems. A blood pressure reading consists of a higher number over a lower number. Ideally, your blood pressure should be below 120/80. The first ("top") number is called the systolic pressure. It is a measure of the pressure in your arteries as your heart beats. The second ("bottom") number is called the diastolic pressure. It is a measure of the pressure in your arteries as the heart relaxes. What are the causes? The exact cause of this condition is not known. There are some conditions that result in high blood pressure. What increases the risk? Certain factors may make you more likely to develop high blood pressure. Some of these risk factors are under your control, including: Smoking. Not getting enough exercise or physical activity. Being overweight. Having too much fat, sugar, calories, or salt (sodium) in your diet. Drinking too much alcohol. Other risk factors include: Having a personal history of heart disease, diabetes, high cholesterol, or kidney disease. Stress. Having a family history of high blood pressure and high cholesterol. Having obstructive sleep apnea. Age. The risk increases with age. What are the signs or symptoms? High blood pressure may not cause symptoms. Very high blood pressure (hypertensive crisis) may cause: Headache. Fast or irregular heartbeats (palpitations). Shortness of breath. Nosebleed. Nausea and vomiting. Vision changes. Severe chest pain, dizziness, and seizures. How is this diagnosed? This condition is diagnosed by  measuring your blood pressure while you are seated, with your arm resting on a flat surface, your legs uncrossed, and your feet flat on the floor. The cuff of the blood pressure monitor will be placed directly against the skin of your upper arm at the level of your heart. Blood pressure should be measured at least twice using the same arm. Certain conditions can cause a difference in blood pressure between your right and left arms. If you have a high blood pressure reading during one visit or you have normal blood pressure with other risk factors, you may be asked to: Return on a different day to have your blood pressure checked again. Monitor your blood pressure at home for 1 week or longer. If you are diagnosed with hypertension, you may have other blood or imaging tests to help your health care provider understand your overall risk for other conditions. How is this treated? This condition is treated by making healthy lifestyle changes, such as eating healthy foods, exercising more, and reducing your alcohol intake. You may be referred for counseling on a healthy diet and physical activity. Your health care provider may prescribe medicine if lifestyle changes are not enough to get your blood pressure under control and if: Your systolic blood pressure is above 130. Your diastolic blood pressure is above 80. Your personal target blood pressure may vary depending on your medical conditions, your age, and other factors. Follow these instructions at home: Eating and drinking  Eat a diet that is high in fiber and potassium, and low in sodium, added sugar, and fat. An example of this eating plan is called the DASH diet. DASH stands for Dietary Approaches to Stop Hypertension. To eat this way: Eat   plenty of fresh fruits and vegetables. Try to fill one half of your plate at each meal with fruits and vegetables. Eat whole grains, such as whole-wheat pasta, brown rice, or whole-grain bread. Fill about one  fourth of your plate with whole grains. Eat or drink low-fat dairy products, such as skim milk or low-fat yogurt. Avoid fatty cuts of meat, processed or cured meats, and poultry with skin. Fill about one fourth of your plate with lean proteins, such as fish, chicken without skin, beans, eggs, or tofu. Avoid pre-made and processed foods. These tend to be higher in sodium, added sugar, and fat. Reduce your daily sodium intake. Many people with hypertension should eat less than 1,500 mg of sodium a day. Do not drink alcohol if: Your health care provider tells you not to drink. You are pregnant, may be pregnant, or are planning to become pregnant. If you drink alcohol: Limit how much you have to: 0-1 drink a day for women. 0-2 drinks a day for men. Know how much alcohol is in your drink. In the [place] one drink equals one 12 oz bottle of beer (355 mL), one 5 oz glass of wine (148 mL), or one 1 oz glass of hard liquor (44 mL). Lifestyle  Work with your health care provider to maintain a healthy body weight or to lose weight. Ask what an ideal weight is for you. Get at least 30 minutes of exercise that causes your heart to beat faster (aerobic exercise) most days of the week. Activities may include walking, swimming, or biking. Include exercise to strengthen your muscles (resistance exercise), such as Pilates or lifting weights, as part of your weekly exercise routine. Try to do these types of exercises for 30 minutes at least 3 days a week. Do not use any products that contain nicotine or tobacco. These products include cigarettes, chewing tobacco, and vaping devices, such as e-cigarettes. If you need help quitting, ask your health care provider. Monitor your blood pressure at home as told by your health care provider. Keep all follow-up visits. This is important. Medicines Take over-the-counter and prescription medicines only as told by your health care provider. Follow directions carefully. Blood  pressure medicines must be taken as prescribed. Do not skip doses of blood pressure medicine. Doing this puts you at risk for problems and can make the medicine less effective. Ask your health care provider about side effects or reactions to medicines that you should watch for. Contact a health care provider if you: Think you are having a reaction to a medicine you are taking. Have headaches that keep coming back (recurring). Feel dizzy. Have swelling in your ankles. Have trouble with your vision. Get help right away if you: Develop a severe headache or confusion. Have unusual weakness or numbness. Feel faint. Have severe pain in your chest or abdomen. Vomit repeatedly. Have trouble breathing. These symptoms may be an emergency. Get help right away. Call 911. Do not wait to see if the symptoms will go away. Do not drive yourself to the hospital. Summary Hypertension is when the force of blood pumping through your arteries is too strong. If this condition is not controlled, it may put you at risk for serious complications. Your personal target blood pressure may vary depending on your medical conditions, your age, and other factors. For most people, a normal blood pressure is less than 120/80. Hypertension is treated with lifestyle changes, medicines, or a combination of both. Lifestyle changes include losing weight, eating a healthy,   low-sodium diet, exercising more, and limiting alcohol. This information is not intended to replace advice given to you by your health care provider. Make sure you discuss any questions you have with your health care provider. Document Revised: 10/30/2020 Document Reviewed: 10/30/2020 Elsevier Patient Education  2024 Elsevier Inc.  

## 2022-06-12 NOTE — Progress Notes (Signed)
Subjective:    Patient ID: Alison Ramsey, female    DOB: 07-21-1970, 52 y.o.   MRN: [id number]  Chief Complaint  Patient presents with   Hypertension   PT presents to the office today to recheck HTN. She was seen on 05/13/22 and we increase Lisinopril to 40 mg from 20 mg. Her BP is not at goal today.  Hypertension This is a chronic problem. The current episode started more than 1 year ago. The problem is unchanged. The problem is uncontrolled. Pertinent negatives include no malaise/fatigue, peripheral edema or shortness of breath. Risk factors for coronary artery disease include dyslipidemia and obesity. Past treatments include ACE inhibitors. The current treatment provides mild improvement.      Review of Systems  Constitutional:  Negative for malaise/fatigue.  Respiratory:  Negative for shortness of breath.   All other systems reviewed and are negative.      Objective:   Physical Exam Vitals reviewed.  Constitutional:      General: She is not in acute distress.    Appearance: She is well-developed. She is obese.  HENT:     Head: Normocephalic and atraumatic.     Right Ear: Tympanic membrane normal.     Left Ear: Tympanic membrane normal.  Eyes:     Pupils: Pupils are equal, round, and reactive to light.  Neck:     Thyroid: No thyromegaly.  Cardiovascular:     Rate and Rhythm: Normal rate and regular rhythm.     Heart sounds: Normal heart sounds. No murmur heard. Pulmonary:     Effort: Pulmonary effort is normal. No respiratory distress.     Breath sounds: Normal breath sounds. No wheezing.  Abdominal:     General: Bowel sounds are normal. There is no distension.     Palpations: Abdomen is soft.     Tenderness: There is no abdominal tenderness.  Musculoskeletal:        General: No tenderness. Normal range of motion.     Cervical back: Normal range of motion and neck supple.  Skin:    General: Skin is warm and dry.  Neurological:     Mental Status: She is alert  and oriented to person, place, and time.     Cranial Nerves: No cranial nerve deficit.     Deep Tendon Reflexes: Reflexes are normal and symmetric.  Psychiatric:        Behavior: Behavior normal.        Thought Content: Thought content normal.        Judgment: Judgment normal.       BP (!) 157/102   Pulse 78   Temp (!) 97.4 F (36.3 C) (Temporal)   Ht 5' 4.5" (1.638 m)   Wt 237 lb (107.5 kg)   SpO2 98%   BMI 40.05 kg/m      Assessment & Plan:  Alison Ramsey comes in today with chief complaint of Hypertension   Diagnosis and orders addressed:  1. Primary hypertension Start HCTZ 25 mg  Continue Lisinopril 40 mg -Daily blood pressure log given with instructions on how to fill out and told to bring to next visit -Dash diet information given -Exercise encouraged - Stress Management  -Continue current meds Labs pending  -RTO in 2 weeks  - hydrochlorothiazide (HYDRODIURIL) 25 MG tablet; Take 1 tablet (25 mg total) by mouth daily.  Dispense: 90 tablet; Refill: 3    Health Maintenance reviewed Diet and exercise encouraged  Follow up plan: 2 weeks  Jannifer Rodney, FNP

## 2022-06-13 ENCOUNTER — Other Ambulatory Visit: Payer: Self-pay | Admitting: Family

## 2022-06-13 DIAGNOSIS — R7309 Other abnormal glucose: Secondary | ICD-10-CM

## 2022-06-26 LAB — SIGNATERA
SIGNATERA MTM READOUT: 0 MTM/ml
SIGNATERA TEST RESULT: NEGATIVE

## 2022-06-27 ENCOUNTER — Ambulatory Visit: Payer: 59 | Admitting: Family

## 2022-07-03 ENCOUNTER — Encounter (HOSPITAL_COMMUNITY): Payer: Self-pay | Admitting: Hematology

## 2022-07-03 ENCOUNTER — Telehealth: Payer: Self-pay | Admitting: *Deleted

## 2022-07-03 NOTE — Telephone Encounter (Signed)
error 

## 2022-07-08 ENCOUNTER — Ambulatory Visit: Payer: 59 | Admitting: Family

## 2022-07-09 ENCOUNTER — Other Ambulatory Visit: Payer: Self-pay

## 2022-07-09 ENCOUNTER — Encounter: Payer: Self-pay | Admitting: Genetic Counselor

## 2022-07-09 ENCOUNTER — Other Ambulatory Visit: Payer: Self-pay | Admitting: Genetic Counselor

## 2022-07-09 ENCOUNTER — Inpatient Hospital Stay: Payer: 59 | Attending: Hematology | Admitting: Genetic Counselor

## 2022-07-09 ENCOUNTER — Inpatient Hospital Stay: Payer: 59

## 2022-07-09 DIAGNOSIS — C186 Malignant neoplasm of descending colon: Secondary | ICD-10-CM

## 2022-07-09 DIAGNOSIS — Z800 Family history of malignant neoplasm of digestive organs: Secondary | ICD-10-CM

## 2022-07-09 DIAGNOSIS — Z803 Family history of malignant neoplasm of breast: Secondary | ICD-10-CM | POA: Diagnosis not present

## 2022-07-09 LAB — GENETIC SCREENING ORDER

## 2022-07-09 NOTE — Progress Notes (Signed)
REFERRING PROVIDER: Junie Spencer, FNP [address] [place],  [place] [address]  PRIMARY PROVIDER:  Junie Spencer, FNP  PRIMARY REASON FOR VISIT:  1. Family history of colon cancer   2. Family history of breast cancer   3. Family history of stomach cancer   4. Malignant neoplasm of descending colon (HCC)      HISTORY OF PRESENT ILLNESS:   Alison Ramsey, a 52 y.o. female, was seen for a [facility] cancer genetics consultation at the request of Dr. Lendon Colonel due to a personal and family history of cancer.  Alison Ramsey presents to clinic today to discuss the possibility of a hereditary predisposition to cancer, genetic testing, and to further clarify her future cancer risks, as well as potential cancer risks for family members.   In May 2023, at the age of 13, Alison Ramsey was diagnosed with colon cancer.  The treatment plan includes chemotherapy.      CANCER HISTORY:  Oncology History  Colon cancer (HCC)  05/09/2021 Initial Diagnosis   Colon cancer (HCC)   05/20/2021 - 07/22/2021 Chemotherapy   Patient is on Treatment Plan : COLORECTAL Xelox (Capeox) q21d        RISK FACTORS:  Menarche was at age 46.  First live birth at age 73.  OCP use for approximately 0 years.  Ovaries intact: yes.  Hysterectomy: yes.  Menopausal status: perimenopausal.  HRT use: 0 years. Colonoscopy: yes; abnormal. Mammogram within the last year: yes. Number of breast biopsies: 0. Up to date with pelvic exams: yes. Any excessive radiation exposure in the past: no  Past Medical History:  Diagnosis Date   Cancer [facility])    Colon cancer (HCC)    Family history of breast cancer    Family history of colon cancer    Family history of stomach cancer    Hypertension    Lyme disease 2018    Past Surgical History:  Procedure Laterality Date   ABDOMINAL HYSTERECTOMY  2013   BIOPSY  04/13/2021   Procedure: BIOPSY;  Surgeon: Lanelle Bal, DO;  [facility];  Service: Endoscopy;;  descending colon  mass   BIOPSY  01/24/2022   Procedure: BIOPSY;  Surgeon: Lanelle Bal, DO;  [facility];  Service: Endoscopy;;   COLONOSCOPY WITH PROPOFOL N/A 01/24/2022   Procedure: COLONOSCOPY WITH PROPOFOL;  Surgeon: Lanelle Bal, DO;  [facility];  Service: Endoscopy;  Laterality: N/A;  1:00 pm   FLEXIBLE SIGMOIDOSCOPY N/A 04/13/2021   Procedure: FLEXIBLE SIGMOIDOSCOPY;  Surgeon: Lanelle Bal, DO;  [facility];  Service: Endoscopy;  Laterality: N/A;   HEMOSTASIS CLIP PLACEMENT  01/24/2022   Procedure: HEMOSTASIS CLIP PLACEMENT;  Surgeon: Lanelle Bal, DO;  [facility];  Service: Endoscopy;;   PARTIAL COLECTOMY N/A 04/15/2021   Procedure: EXTENDED RIGHT HEMICOLECTOMY;  Surgeon: [name], MD;  Location: [facility];  Service: General;  Laterality: N/A;   POLYPECTOMY  01/24/2022   Procedure: POLYPECTOMY;  Surgeon: Lanelle Bal, DO;  [facility];  Service: Endoscopy;;   PORTACATH PLACEMENT Left 05/13/2021   Procedure: INSERTION PORT-A-CATH;  Surgeon: [name], MD;  Location: [facility];  Service: General;  Laterality: Left;   SUBMUCOSAL LIFTING INJECTION  01/24/2022   Procedure: SUBMUCOSAL LIFTING INJECTION;  Surgeon: Lanelle Bal, DO;  [facility];  Service: Endoscopy;;   WISDOM TOOTH EXTRACTION      Social History   Socioeconomic History  Marital status: Married    Spouse name: Not on file   Number of children: Not on file   Years of education: Not on file   Highest education level: Not on file  Occupational History   Not on file  Tobacco Use   Smoking status: Never   Smokeless tobacco: Never  Vaping Use   Vaping Use: Never used  Substance and Sexual Activity   Alcohol use: Yes    Comment: occasionally   Drug use: Yes    Types: Marijuana   Sexual activity: Yes  Other Topics Concern   Not on file  Social History Narrative   Not on file   Social Determinants of Health   Financial Resource  Strain: Not on file  Food Insecurity: Not on file  Transportation Needs: Not on file  Physical Activity: Not on file  Stress: Not on file  Social Connections: Not on file     FAMILY HISTORY:  We obtained a detailed, 4-generation family history.  Significant diagnoses are listed below: Family History  Problem Relation Age of Onset   Kidney disease Mother    Hypertension Mother    Breast cancer Mother 85 - 58   Stroke Father    Colon cancer Maternal Aunt 41       d. 46   Other Maternal Aunt 84       pituitary cancer   Breast cancer Maternal Aunt 67   Stomach cancer Maternal Uncle 8   Breast cancer Paternal Aunt    Lung cancer Maternal Grandfather    Colon cancer Cousin        d. 30s; maternal first cousin     The patient has three sons and a daughter who are cancer free. She ha a brother who is cancer free.  Her parents are both deceased.  The patients father died of a stroke. He had 10 siblings, two sisters had breast cancer.  There is no other reported family history of cancer on the paternal side.  The patient's mother was diagnosed with breast cancer between ages 107-45.  She had two sisters and a brother.  The brother had stomach cancer in his 21's and had a son who died of colon cancer at 27.  One sister had colon cancer in her 21's and died and her son has colon polyps.  Another sister had a pituitary tumor and breast cancer.  Her children are cancer free.  There is a remote history of ovarian cancer and a lung cancer in the maternal grandfather.  Alison Ramsey is aware of previous family history of genetic testing for hereditary cancer risks. Patient's maternal ancestors are of [place] and native [place] descent, and paternal ancestors are of [place] descent. There is no reported Ashkenazi Jewish ancestry. There is no known consanguinity.  GENETIC COUNSELING ASSESSMENT: Ms. Streif is a 52 y.o. female with a personal and family history of cancer which is somewhat  suggestive of a hereditary cancer syndrome and predisposition to cancer given the young ages of onset and combination of cancer. We, therefore, discussed and recommended the following at today's visit.   DISCUSSION: We discussed that, in general, most cancer is not inherited in families, but instead is sporadic or familial. Sporadic cancers occur by chance and typically happen at older ages (>50 years) as this type of cancer is caused by genetic changes acquired during an individual's lifetime. Some families have more cancers than would be expected by chance; however, the ages or types of  cancer are not consistent with a known genetic mutation or known genetic mutations have been ruled out. This type of familial cancer is thought to be due to a combination of multiple genetic, environmental, hormonal, and lifestyle factors. While this combination of factors likely increases the risk of cancer, the exact source of this risk is not currently identifiable or testable.  We discussed that 5 - 7% of colon cancer is hereditary, with most cases associated with Lynch syndrome.  There are other genes that can be associated with hereditary colon cancer syndromes.  These include APC, MUTYH and BMPR1A.  We discussed that testing is beneficial for several reasons including knowing how to follow individuals after completing their treatment, identifying whether potential treatment options such as PARP inhibitors would be beneficial, and understand if other family members could be at risk for cancer and allow them to undergo genetic testing.   We reviewed the characteristics, features and inheritance patterns of hereditary cancer syndromes. We also discussed genetic testing, including the appropriate family members to test, the process of testing, insurance coverage and turn-around-time for results. We discussed the implications of a negative, positive, carrier and/or variant of uncertain significant result. Ms. Musacchia  was  offered a common hereditary cancer panel (47 genes) and an expanded pan-cancer panel (77 genes). Ms. Trettel was informed of the benefits and limitations of each panel, including that expanded pan-cancer panels contain genes that do not have clear management guidelines at this point in time.  We also discussed that as the number of genes included on a panel increases, the chances of variants of uncertain significance increases. Ms. Hersman decided to pursue genetic testing for the Multi cancer panel + RNA gene panel.   The Multi-Cancer + RNA Panel offered by [facility] includes sequencing and/or deletion/duplication analysis of the following 70 genes:  AIP*, ALK, APC*, ATM*, AXIN2*, BAP1*, BARD1*, BLM*, BMPR1A*, BRCA1*, BRCA2*, BRIP1*, CDC73*, CDH1*, CDK4, CDKN1B*, CDKN2A, CHEK2*, CTNNA1*, DICER1*, EPCAM (del/dup only), EGFR, FH*, FLCN*, GREM1 (promoter dup only), HOXB13, KIT, LZTR1, MAX*, MBD4, MEN1*, MET, MITF, MLH1*, MSH2*, MSH3*, MSH6*, MUTYH*, NF1*, NF2*, NTHL1*, PALB2*, PDGFRA, PMS2*, POLD1*, POLE*, POT1*, PRKAR1A*, PTCH1*, PTEN*, RAD51C*, RAD51D*, RB1*, RET, SDHA* (sequencing only), SDHAF2*, SDHB*, SDHC*, SDHD*, SMAD4*, SMARCA4*, SMARCB1*, SMARCE1*, STK11*, SUFU*, TMEM127*, TP53*, TSC1*, TSC2*, VHL*. RNA analysis is performed for * genes.   Based on Ms. Ollis's personal and family history of cancer, she meets medical criteria for genetic testing. Despite that she meets criteria, she may still have an out of pocket cost. We discussed that if her out of pocket cost for testing is over $100, the laboratory will call and confirm whether she wants to proceed with testing.  If the out of pocket cost of testing is less than $100 she will be billed by the genetic testing laboratory.   We discussed that some people do not want to undergo genetic testing due to fear of genetic discrimination.  The Genetic Information Nondiscrimination Act (GINA) was signed into federal law in 2008. GINA prohibits health insurers and most  employers from discriminating against individuals based on genetic information (including the results of genetic tests and family history information). According to GINA, health insurance companies cannot consider genetic information to be a preexisting condition, nor can they use it to make decisions regarding coverage or rates. GINA also makes it illegal for most employers to use genetic information in making decisions about hiring, firing, promotion, or terms of employment. It is important to note that GINA does not offer  protections for life insurance, disability insurance, or long-term care insurance. GINA does not apply to those in the [employer], those who work for companies with less than 15 employees, and new life insurance or long-term disability insurance policies.  Health status due to a cancer diagnosis is not protected under GINA. More information about GINA can be found by visiting [email or web address].   PLAN: After considering the risks, benefits, and limitations, Ms. Medlen provided informed consent to pursue genetic testing and the blood sample was sent to [facility] for analysis of the Multi-cancer + RNA. Results should be available within approximately 2-3 weeks' time, at which point they will be disclosed by telephone to Ms. Rivest, as will any additional recommendations warranted by these results. Ms. Arnaud will receive a summary of her genetic counseling visit and a copy of her results once available. This information will also be available in Epic.   Lastly, we encouraged Ms. Dermody to remain in contact with cancer genetics annually so that we can continuously update the family history and inform her of any changes in cancer genetics and testing that may be of benefit for this family.   Ms. Famous questions were answered to her satisfaction today. Our contact information was provided should additional questions or concerns arise. Thank you for the referral and allowing [place] to share in the  care of your patient.   Tunisha Ruland P. Lowell Guitar, MS, [facility] Licensed, Patent attorney [email or web address] phone: [phone]  The patient was seen for a total of 40 minutes in face-to-face genetic counseling.  The patient brought her husband. Drs. Meliton Rattan, and/or [place] were available for questions, if needed..    _______________________________________________________________________ For Office Staff:  Number of people involved in session: 2 Was an Intern/ student involved with case: no

## 2022-07-18 ENCOUNTER — Telehealth: Payer: Self-pay | Admitting: Genetic Counselor

## 2022-07-18 ENCOUNTER — Encounter: Payer: Self-pay | Admitting: Genetic Counselor

## 2022-07-18 DIAGNOSIS — Z1379 Encounter for other screening for genetic and chromosomal anomalies: Secondary | ICD-10-CM | POA: Insufficient documentation

## 2022-07-18 NOTE — Telephone Encounter (Signed)
LM on VM that results are back and to please call.  Left CB instructions. 

## 2022-07-21 NOTE — Telephone Encounter (Signed)
 LM on VM that results are back and to please call.  Left CB instructions. 

## 2022-07-22 ENCOUNTER — Encounter (HOSPITAL_COMMUNITY): Payer: Self-pay | Admitting: Hematology

## 2022-07-22 ENCOUNTER — Ambulatory Visit: Payer: Self-pay | Admitting: Genetic Counselor

## 2022-07-22 DIAGNOSIS — Z1379 Encounter for other screening for genetic and chromosomal anomalies: Secondary | ICD-10-CM

## 2022-07-22 NOTE — Telephone Encounter (Signed)
Revealed negative genetic testing.  Discussed that we do not know why she has colon cancer or why there is cancer in the family. It could be due to a different gene that we are not testing, or maybe our current technology may not be able to pick something up.  It will be important for her to keep in contact with genetics to keep up with whether additional testing may be needed.    

## 2022-07-22 NOTE — Progress Notes (Signed)
HPI:  Ms. Pellot was previously seen in the [facility] due to a personal and family history of cancer and concerns regarding a hereditary predisposition to cancer. Please refer to our prior cancer genetics clinic note for more information regarding our discussion, assessment and recommendations, at the time. Ms. Sword recent genetic test results were disclosed to her, as were recommendations warranted by these results. These results and recommendations are discussed in more detail below.  CANCER HISTORY:  Oncology History  Colon cancer (HCC)  05/09/2021 Initial Diagnosis   Colon cancer (HCC)   05/20/2021 - 07/22/2021 Chemotherapy   Patient is on Treatment Plan : COLORECTAL Xelox (Capeox) q21d     07/17/2022 Genetic Testing   Negative genetic testing on the Multi-cancer + RNA panel.  The report date is July 17, 2022.  The Multi-Cancer + RNA Panel offered by [facility] includes sequencing and/or deletion/duplication analysis of the following 70 genes:  AIP*, ALK, APC*, ATM*, AXIN2*, BAP1*, BARD1*, BLM*, BMPR1A*, BRCA1*, BRCA2*, BRIP1*, CDC73*, CDH1*, CDK4, CDKN1B*, CDKN2A, CHEK2*, CTNNA1*, DICER1*, EPCAM (del/dup only), EGFR, FH*, FLCN*, GREM1 (promoter dup only), HOXB13, KIT, LZTR1, MAX*, MBD4, MEN1*, MET, MITF, MLH1*, MSH2*, MSH3*, MSH6*, MUTYH*, NF1*, NF2*, NTHL1*, PALB2*, PDGFRA, PMS2*, POLD1*, POLE*, POT1*, PRKAR1A*, PTCH1*, PTEN*, RAD51C*, RAD51D*, RB1*, RET, SDHA* (sequencing only), SDHAF2*, SDHB*, SDHC*, SDHD*, SMAD4*, SMARCA4*, SMARCB1*, SMARCE1*, STK11*, SUFU*, TMEM127*, TP53*, TSC1*, TSC2*, VHL*. RNA analysis is performed for * genes.     FAMILY HISTORY:  We obtained a detailed, 4-generation family history.  Significant diagnoses are listed below: Family History  Problem Relation Age of Onset   Kidney disease Mother    Hypertension Mother    Breast cancer Mother 30 - 61   Stroke Father    Colon cancer Maternal Aunt 72       d. 80   Other Maternal Aunt 43        pituitary cancer   Breast cancer Maternal Aunt 29   Stomach cancer Maternal Uncle 60   Breast cancer Paternal Aunt    Lung cancer Maternal Grandfather    Colon cancer Cousin        d. 30s; maternal first cousin       The patient has three sons and a daughter who are cancer free. She ha a brother who is cancer free.  Her parents are both deceased.   The patients father died of a stroke. He had 10 siblings, two sisters had breast cancer.  There is no other reported family history of cancer on the paternal side.   The patient's mother was diagnosed with breast cancer between ages 28-45.  She had two sisters and a brother.  The brother had stomach cancer in his 47's and had a son who died of colon cancer at 10.  One sister had colon cancer in her 79's and died and her son has colon polyps.  Another sister had a pituitary tumor and breast cancer.  Her children are cancer free.  There is a remote history of ovarian cancer and a lung cancer in the maternal grandfather.   Ms. Brenn is aware of previous family history of genetic testing for hereditary cancer risks. Patient's maternal ancestors are of [place] and native [place] descent, and paternal ancestors are of [place] descent. There is no reported Ashkenazi Jewish ancestry. There is no known consanguinity  GENETIC TEST RESULTS: Genetic testing reported out on July 16, 2022 through the Multi-cancer + RNA cancer panel found no pathogenic mutations. The  Multi-Cancer + RNA Panel offered by [facility] includes sequencing and/or deletion/duplication analysis of the following 70 genes:  AIP*, ALK, APC*, ATM*, AXIN2*, BAP1*, BARD1*, BLM*, BMPR1A*, BRCA1*, BRCA2*, BRIP1*, CDC73*, CDH1*, CDK4, CDKN1B*, CDKN2A, CHEK2*, CTNNA1*, DICER1*, EPCAM (del/dup only), EGFR, FH*, FLCN*, GREM1 (promoter dup only), HOXB13, KIT, LZTR1, MAX*, MBD4, MEN1*, MET, MITF, MLH1*, MSH2*, MSH3*, MSH6*, MUTYH*, NF1*, NF2*, NTHL1*, PALB2*, PDGFRA, PMS2*, POLD1*, POLE*, POT1*,  PRKAR1A*, PTCH1*, PTEN*, RAD51C*, RAD51D*, RB1*, RET, SDHA* (sequencing only), SDHAF2*, SDHB*, SDHC*, SDHD*, SMAD4*, SMARCA4*, SMARCB1*, SMARCE1*, STK11*, SUFU*, TMEM127*, TP53*, TSC1*, TSC2*, VHL*. RNA analysis is performed for * genes. The test report has been scanned into EPIC and is located under the Molecular Pathology section of the Results Review tab.  A portion of the result report is included below for reference.     We discussed with Ms. Blue that because current genetic testing is not perfect, it is possible there may be a gene mutation in one of these genes that current testing cannot detect, but that chance is small.  We also discussed, that there could be another gene that has not yet been discovered, or that we have not yet tested, that is responsible for the cancer diagnoses in the family. It is also possible there is a hereditary cause for the cancer in the family that Ms. Eustice did not inherit and therefore was not identified in her testing.  Therefore, it is important to remain in touch with cancer genetics in the future so that we can continue to offer Ms. Laminack the most up to date genetic testing.   ADDITIONAL GENETIC TESTING:We discussed with Ms. Cazarez that her genetic testing was fairly extensive.  If there are genes identified to increase cancer risk that can be analyzed in the future, we would be happy to discuss and coordinate this testing at that time.    CANCER SCREENING RECOMMENDATIONS: Ms. Cantrell test result is considered negative (normal).  This means that we have not identified a hereditary cause for her personal and family history of cancer at this time. Most cancers happen by chance and this negative test suggests that her cancer may fall into this category.    Possible reasons for Ms. Newville's negative genetic test include:  1. There may be a gene mutation in one of these genes that current testing methods cannot detect but that chance is small.  2. There could be another  gene that has not yet been discovered, or that we have not yet tested, that is responsible for the cancer diagnoses in the family.  3.  There may be no hereditary risk for cancer in the family. The cancers in Ms. Persing and/or her family may be sporadic/familial or due to other genetic and environmental factors. 4. It is also possible there is a hereditary cause for the cancer in the family that Ms. Southgate did not inherit.  Therefore, it is recommended she continue to follow the cancer management and screening guidelines provided by her oncology and primary healthcare provider. An individual's cancer risk and medical management are not determined by genetic test results alone. Overall cancer risk assessment incorporates additional factors, including personal medical history, family history, and any available genetic information that may result in a personalized plan for cancer prevention and surveillance  RECOMMENDATIONS FOR FAMILY MEMBERS:  Individuals in this family might be at some increased risk of developing cancer, over the general population risk, simply due to the family history of cancer.  We recommended women in this family have  a yearly mammogram beginning at age 53, or 66 years younger than the earliest onset of cancer, an annual clinical breast exam, and perform monthly breast self-exams. Women in this family should also have a gynecological exam as recommended by their primary provider. All family members should be referred for colonoscopy starting at age 54.  It is also possible there is a hereditary cause for the cancer in Ms. Berkery's family that she did not inherit and therefore was not identified in her.  Based on Ms. Bluestein's family history, we recommended her brother have genetic counseling and testing. Ms. Graciano will let us know if we can be of any assistance in coordinating genetic counseling and/or testing for this family member.   FOLLOW-UP: Lastly, we discussed with Ms. Sweeten that cancer  genetics is a rapidly advancing field and it is possible that new genetic tests will be appropriate for her and/or her family members in the future. We encouraged her to remain in contact with cancer genetics on an annual basis so we can update her personal and family histories and let her know of advances in cancer genetics that may benefit this family.   Our contact number was provided. Ms. Crownover questions were answered to her satisfaction, and she knows she is welcome to call us at anytime with additional questions or concerns.   Maylon Cos, MS, [facility] Licensed, Certified Genetic Counselor [email or web address]

## 2022-07-30 ENCOUNTER — Other Ambulatory Visit (INDEPENDENT_AMBULATORY_CARE_PROVIDER_SITE_OTHER): Payer: Self-pay

## 2022-07-30 ENCOUNTER — Ambulatory Visit (INDEPENDENT_AMBULATORY_CARE_PROVIDER_SITE_OTHER): Payer: 59 | Admitting: Internal Medicine

## 2022-07-30 ENCOUNTER — Encounter (INDEPENDENT_AMBULATORY_CARE_PROVIDER_SITE_OTHER): Payer: Self-pay

## 2022-07-30 ENCOUNTER — Encounter: Payer: Self-pay | Admitting: Internal Medicine

## 2022-07-30 VITALS — BP 168/129 | HR 82 | Temp 98.4°F | Ht 64.5 in | Wt 238.9 lb

## 2022-07-30 DIAGNOSIS — C186 Malignant neoplasm of descending colon: Secondary | ICD-10-CM

## 2022-07-30 DIAGNOSIS — I10 Essential (primary) hypertension: Secondary | ICD-10-CM

## 2022-07-30 DIAGNOSIS — D126 Benign neoplasm of colon, unspecified: Secondary | ICD-10-CM

## 2022-07-30 MED ORDER — CLENPIQ 10-3.5-12 MG-GM -GM/175ML PO SOLN: 1.0000 | ORAL | 0 refills | Status: DC

## 2022-07-30 NOTE — Patient Instructions (Signed)
We will schedule you for colonoscopy to ensure that the large polyp from the prior colonoscopy has been completely removed.  We will use a small volume prep this time..  If this colonoscopy is normal, you will not need to have one for another 3 years.  It was very nice seeing you guys again today.  Dr. Marletta Lor

## 2022-07-30 NOTE — Progress Notes (Signed)
Referring Provider: Junie Spencer, FNP Primary Care Physician:  Junie Spencer, FNP Primary GI:  Dr. Marletta Lor  Chief Complaint  Patient presents with   Colon Cancer Screening    Follow up visit to schedule repeat colonoscopy. Had one done 6 months ago and needs repeat due to polyp removed from her anastomosis was benign, inflammatory.  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  We will need to repeat colonoscopy in 6 months to ensure this was completely removed.      HPI:   Alison Ramsey is a 52 y.o. female who presents to the clinic today for follow-up visit.  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  Currently following with Dr. Kirtland Bouchard of oncology.  Has completed chemotherapy with most recent CT scan October 2023 without evidence of cancer.  She underwent c surveillance colonoscopy 01/24/2022.  Anastomosis.  Healthy, one 7 mm polyp removed in this region which was inflammatory.  Large 25 mm polyp was found in the sigmoid colon was successfully removed with endoscopic mucosal resection.  Likely this polyp was not seen on initial flexible sigmoidoscopy given the poor prep.  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  No carcinoma.  Today, states she is doing well. No complaints.   Past Medical History:  Diagnosis Date   Cancer [facility])    Colon cancer (HCC)    Family history of breast cancer    Family history of colon cancer    Family history of stomach cancer    Hypertension    Lyme disease 2018    Past Surgical History:  Procedure Laterality Date   ABDOMINAL HYSTERECTOMY  2013   BIOPSY  04/13/2021   Procedure: BIOPSY;  Surgeon: Lanelle Bal, DO;  [facility]: [facility];  Service: Endoscopy;;  descending colon mass   BIOPSY  01/24/2022   Procedure: BIOPSY;  Surgeon: Lanelle Bal, DO;  [facility]: [facility];  Service: Endoscopy;;    COLONOSCOPY WITH PROPOFOL N/A 01/24/2022   Procedure: COLONOSCOPY WITH PROPOFOL;  Surgeon: Lanelle Bal, DO;  [facility]: [facility];  Service: Endoscopy;  Laterality: N/A;  1:00 pm   FLEXIBLE SIGMOIDOSCOPY N/A 04/13/2021   Procedure: FLEXIBLE SIGMOIDOSCOPY;  Surgeon: Lanelle Bal, DO;  [facility];  Service: Endoscopy;  Laterality: N/A;   HEMOSTASIS CLIP PLACEMENT  01/24/2022   Procedure: HEMOSTASIS CLIP PLACEMENT;  Surgeon: Lanelle Bal, DO;  [facility];  Service: Endoscopy;;   PARTIAL COLECTOMY N/A 04/15/2021   Procedure: EXTENDED RIGHT HEMICOLECTOMY;  Surgeon: [name], MD;  Location: [facility];  Service: General;  Laterality: N/A;   POLYPECTOMY  01/24/2022   Procedure: POLYPECTOMY;  Surgeon: Lanelle Bal, DO;  [facility];  Service: Endoscopy;;   PORTACATH PLACEMENT Left 05/13/2021   Procedure: INSERTION PORT-A-CATH;  Surgeon: [name], MD;  Location: [facility];  Service: General;  Laterality: Left;   SUBMUCOSAL LIFTING INJECTION  01/24/2022   Procedure: SUBMUCOSAL LIFTING INJECTION;  Surgeon: Lanelle Bal, DO;  [facility];  Service: Endoscopy;;   WISDOM TOOTH EXTRACTION      Current Outpatient Medications  Medication Sig Dispense Refill   ASHWAGANDHA PO Take 1,000 mg by mouth at bedtime.     cetirizine (ZYRTEC) 10 MG tablet Take 10 mg by mouth at bedtime.     Cyanocobalamin (VITAMIN B-12) 5000 MCG  TBDP Take 5,000 mcg by mouth at bedtime.     FIBER PO Take 5 g by mouth daily.     Lactobacillus (ACIDOPHILUS PO) Take 1 capsule by mouth at bedtime.     lisinopril (ZESTRIL) 40 MG tablet Take 1 tablet (40 mg total) by mouth daily. 90 tablet 3   loratadine (CLARITIN) 10 MG tablet Take 10 mg by mouth at bedtime.     MAGNESIUM PO Take 400 mg by mouth every evening.     Multiple Vitamins-Minerals (ZINC PO) Take 1 tablet by mouth at bedtime. One A Day     VITAMIN D PO Take 1,000 Units by mouth at bedtime.     ferrous  sulfate 325 (65 FE) MG tablet Take 325 mg by mouth every evening. (Patient not taking: Reported on 06/12/2022)     hydrochlorothiazide (HYDRODIURIL) 25 MG tablet Take 1 tablet (25 mg total) by mouth daily. (Patient not taking: Reported on 07/30/2022) 90 tablet 3   No current facility-administered medications for this visit.    Allergies as of 07/30/2022 - Review Complete 07/30/2022  Allergen Reaction Noted   Aleve [naproxen] Hives 04/12/2021   Codeine Hives, Nausea And Vomiting, and Rash 04/14/2016   Latex Rash 05/23/2015    Family History  Problem Relation Age of Onset   Kidney disease Mother    Hypertension Mother    Breast cancer Mother 28 - 13   Stroke Father    Colon cancer Maternal Aunt 79       d. 60   Other Maternal Aunt 69       pituitary cancer   Breast cancer Maternal Aunt 59   Stomach cancer Maternal Uncle 46   Breast cancer Paternal Aunt    Lung cancer Maternal Grandfather    Colon cancer Cousin        d. 66s; maternal first cousin    Social History   Socioeconomic History   Marital status: Married    Spouse name: Not on file   Number of children: Not on file   Years of education: Not on file   Highest education level: Not on file  Occupational History   Not on file  Tobacco Use   Smoking status: Never    Passive exposure: Past   Smokeless tobacco: Never  Vaping Use   Vaping status: Never Used  Substance and Sexual Activity   Alcohol use: Yes    Comment: occasionally   Drug use: Yes    Types: Marijuana   Sexual activity: Yes  Other Topics Concern   Not on file  Social History Narrative   Not on file   Social Determinants of Health   Financial Resource Strain: Not on file  Food Insecurity: Not on file  Transportation Needs: Not on file  Physical Activity: Not on file  Stress: Not on file  Social Connections: Not on file    Subjective: Review of Systems  Constitutional:  Negative for chills and fever.  HENT:  Negative for congestion and  hearing loss.   Eyes:  Negative for blurred vision and double vision.  Respiratory:  Negative for cough and shortness of breath.   Cardiovascular:  Negative for chest pain and palpitations.  Gastrointestinal:  Negative for abdominal pain, blood in stool, constipation, diarrhea, heartburn, melena and vomiting.  Genitourinary:  Negative for dysuria and urgency.  Musculoskeletal:  Negative for joint pain and myalgias.  Skin:  Negative for itching and rash.  Neurological:  Negative for dizziness and headaches.  Psychiatric/Behavioral:  Negative for depression. The patient is not nervous/anxious.      Objective: Ht 5' 4.5" (1.638 m)   Wt 238 lb 14.4 oz (108.4 kg)   BMI 40.37 kg/m  Physical Exam Constitutional:      Appearance: Normal appearance.  HENT:     Head: Normocephalic and atraumatic.  Eyes:     Extraocular Movements: Extraocular movements intact.     Conjunctiva/sclera: Conjunctivae normal.  Cardiovascular:     Rate and Rhythm: Normal rate and regular rhythm.  Pulmonary:     Effort: Pulmonary effort is normal.     Breath sounds: Normal breath sounds.  Abdominal:     General: Bowel sounds are normal.     Palpations: Abdomen is soft.  Musculoskeletal:        General: No swelling. Normal range of motion.     Cervical back: Normal range of motion and neck supple.  Skin:    General: Skin is warm and dry.     Coloration: Skin is not jaundiced.  Neurological:     General: No focal deficit present.     Mental Status: She is alert and oriented to person, place, and time.  Psychiatric:        Mood and Affect: Mood normal.        Behavior: Behavior normal.      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  Will schedule for repeat colonoscopy to ensure the  large sigmoid polyp was completely excised.  The risks including infection, bleed, or perforation as well as benefits, limitations, alternatives and imponderables have been reviewed with the patient. Questions have been answered. All parties agreeable.  The patient was found to have elevated blood pressure when vital signs were checked in the office. The blood pressure was rechecked by the nursing staff, and it was found be persistently elevated >140/90 mmHg (187/104 on recheck) I personally advised the patient to follow up closely with the PCP for hypertension control.  She states she will do so as she just got insurance.  Currently asymptomatic.  If develops symptoms, would recommend she go to the ER for evaluation.  She understands.  07/30/2022 3:44 PM   Disclaimer: This note was dictated with voice recognition software. Similar sounding words can inadvertently be transcribed and may not be corrected upon review.

## 2022-07-30 NOTE — Addendum Note (Signed)
Addended by: Marlowe Shores on: 07/30/2022 04:29 PM   Modules accepted: Orders

## 2022-07-30 NOTE — H&P (View-Only) (Signed)
 Referring Provider: Junie Spencer, FNP Primary Care Physician:  Junie Spencer, FNP Primary GI:  Dr. Marletta Lor  Chief Complaint  Patient presents with   Colon Cancer Screening    Follow up visit to schedule repeat colonoscopy. Had one done 6 months ago and needs repeat due to polyp removed from her anastomosis was benign, inflammatory.  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  We will need to repeat colonoscopy in 6 months to ensure this was completely removed.      HPI:   Alison Ramsey is a 52 y.o. female who presents to the clinic today for follow-up visit.  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  Currently following with Dr. Kirtland Bouchard of oncology.  Has completed chemotherapy with most recent CT scan October 2023 without evidence of cancer.  She underwent c surveillance colonoscopy 01/24/2022.  Anastomosis.  Healthy, one 7 mm polyp removed in this region which was inflammatory.  Large 25 mm polyp was found in the sigmoid colon was successfully removed with endoscopic mucosal resection.  Likely this polyp was not seen on initial flexible sigmoidoscopy given the poor prep.  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  No carcinoma.  Today, states she is doing well. No complaints.   Past Medical History:  Diagnosis Date   Cancer [facility])    Colon cancer (HCC)    Family history of breast cancer    Family history of colon cancer    Family history of stomach cancer    Hypertension    Lyme disease 2018    Past Surgical History:  Procedure Laterality Date   ABDOMINAL HYSTERECTOMY  2013   BIOPSY  04/13/2021   Procedure: BIOPSY;  Surgeon: Lanelle Bal, DO;  [facility];  Service: Endoscopy;;  descending colon mass   BIOPSY  01/24/2022   Procedure: BIOPSY;  Surgeon: Lanelle Bal, DO;  [facility];  Service: Endoscopy;;    COLONOSCOPY WITH PROPOFOL N/A 01/24/2022   Procedure: COLONOSCOPY WITH PROPOFOL;  Surgeon: Lanelle Bal, DO;  [facility]: [facility];  Service: Endoscopy;  Laterality: N/A;  1:00 pm   FLEXIBLE SIGMOIDOSCOPY N/A 04/13/2021   Procedure: FLEXIBLE SIGMOIDOSCOPY;  Surgeon: Lanelle Bal, DO;  [facility];  Service: Endoscopy;  Laterality: N/A;   HEMOSTASIS CLIP PLACEMENT  01/24/2022   Procedure: HEMOSTASIS CLIP PLACEMENT;  Surgeon: Lanelle Bal, DO;  [facility];  Service: Endoscopy;;   PARTIAL COLECTOMY N/A 04/15/2021   Procedure: EXTENDED RIGHT HEMICOLECTOMY;  Surgeon: [name], MD;  Location: [facility];  Service: General;  Laterality: N/A;   POLYPECTOMY  01/24/2022   Procedure: POLYPECTOMY;  Surgeon: Lanelle Bal, DO;  [facility];  Service: Endoscopy;;   PORTACATH PLACEMENT Left 05/13/2021   Procedure: INSERTION PORT-A-CATH;  Surgeon: [name], MD;  Location: [facility];  Service: General;  Laterality: Left;   SUBMUCOSAL LIFTING INJECTION  01/24/2022   Procedure: SUBMUCOSAL LIFTING INJECTION;  Surgeon: Lanelle Bal, DO;  [facility];  Service: Endoscopy;;   WISDOM TOOTH EXTRACTION      Current Outpatient Medications  Medication Sig Dispense Refill   ASHWAGANDHA PO Take 1,000 mg by mouth at bedtime.     cetirizine (ZYRTEC) 10 MG tablet Take 10 mg by mouth at bedtime.     Cyanocobalamin (VITAMIN B-12) 5000 MCG  TBDP Take 5,000 mcg by mouth at bedtime.     FIBER PO Take 5 g by mouth daily.     Lactobacillus (ACIDOPHILUS PO) Take 1 capsule by mouth at bedtime.     lisinopril (ZESTRIL) 40 MG tablet Take 1 tablet (40 mg total) by mouth daily. 90 tablet 3   loratadine (CLARITIN) 10 MG tablet Take 10 mg by mouth at bedtime.     MAGNESIUM PO Take 400 mg by mouth every evening.     Multiple Vitamins-Minerals (ZINC PO) Take 1 tablet by mouth at bedtime. One A Day     VITAMIN D PO Take 1,000 Units by mouth at bedtime.     ferrous  sulfate 325 (65 FE) MG tablet Take 325 mg by mouth every evening. (Patient not taking: Reported on 06/12/2022)     hydrochlorothiazide (HYDRODIURIL) 25 MG tablet Take 1 tablet (25 mg total) by mouth daily. (Patient not taking: Reported on 07/30/2022) 90 tablet 3   No current facility-administered medications for this visit.    Allergies as of 07/30/2022 - Review Complete 07/30/2022  Allergen Reaction Noted   Aleve [naproxen] Hives 04/12/2021   Codeine Hives, Nausea And Vomiting, and Rash 04/14/2016   Latex Rash 05/23/2015    Family History  Problem Relation Age of Onset   Kidney disease Mother    Hypertension Mother    Breast cancer Mother 28 - 13   Stroke Father    Colon cancer Maternal Aunt 79       d. 60   Other Maternal Aunt 69       pituitary cancer   Breast cancer Maternal Aunt 59   Stomach cancer Maternal Uncle 46   Breast cancer Paternal Aunt    Lung cancer Maternal Grandfather    Colon cancer Cousin        d. 66s; maternal first cousin    Social History   Socioeconomic History   Marital status: Married    Spouse name: Not on file   Number of children: Not on file   Years of education: Not on file   Highest education level: Not on file  Occupational History   Not on file  Tobacco Use   Smoking status: Never    Passive exposure: Past   Smokeless tobacco: Never  Vaping Use   Vaping status: Never Used  Substance and Sexual Activity   Alcohol use: Yes    Comment: occasionally   Drug use: Yes    Types: Marijuana   Sexual activity: Yes  Other Topics Concern   Not on file  Social History Narrative   Not on file   Social Determinants of Health   Financial Resource Strain: Not on file  Food Insecurity: Not on file  Transportation Needs: Not on file  Physical Activity: Not on file  Stress: Not on file  Social Connections: Not on file    Subjective: Review of Systems  Constitutional:  Negative for chills and fever.  HENT:  Negative for congestion and  hearing loss.   Eyes:  Negative for blurred vision and double vision.  Respiratory:  Negative for cough and shortness of breath.   Cardiovascular:  Negative for chest pain and palpitations.  Gastrointestinal:  Negative for abdominal pain, blood in stool, constipation, diarrhea, heartburn, melena and vomiting.  Genitourinary:  Negative for dysuria and urgency.  Musculoskeletal:  Negative for joint pain and myalgias.  Skin:  Negative for itching and rash.  Neurological:  Negative for dizziness and headaches.  Psychiatric/Behavioral:  Negative for depression. The patient is not nervous/anxious.      Objective: Ht 5' 4.5" (1.638 m)   Wt 238 lb 14.4 oz (108.4 kg)   BMI 40.37 kg/m  Physical Exam Constitutional:      Appearance: Normal appearance.  HENT:     Head: Normocephalic and atraumatic.  Eyes:     Extraocular Movements: Extraocular movements intact.     Conjunctiva/sclera: Conjunctivae normal.  Cardiovascular:     Rate and Rhythm: Normal rate and regular rhythm.  Pulmonary:     Effort: Pulmonary effort is normal.     Breath sounds: Normal breath sounds.  Abdominal:     General: Bowel sounds are normal.     Palpations: Abdomen is soft.  Musculoskeletal:        General: No swelling. Normal range of motion.     Cervical back: Normal range of motion and neck supple.  Skin:    General: Skin is warm and dry.     Coloration: Skin is not jaundiced.  Neurological:     General: No focal deficit present.     Mental Status: She is alert and oriented to person, place, and time.  Psychiatric:        Mood and Affect: Mood normal.        Behavior: Behavior normal.      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  Will schedule for repeat colonoscopy to ensure the  large sigmoid polyp was completely excised.  The risks including infection, bleed, or perforation as well as benefits, limitations, alternatives and imponderables have been reviewed with the patient. Questions have been answered. All parties agreeable.  The patient was found to have elevated blood pressure when vital signs were checked in the office. The blood pressure was rechecked by the nursing staff, and it was found be persistently elevated >140/90 mmHg (187/104 on recheck) I personally advised the patient to follow up closely with the PCP for hypertension control.  She states she will do so as she just got insurance.  Currently asymptomatic.  If develops symptoms, would recommend she go to the ER for evaluation.  She understands.  07/30/2022 3:44 PM   Disclaimer: This note was dictated with voice recognition software. Similar sounding words can inadvertently be transcribed and may not be corrected upon review.

## 2022-08-04 ENCOUNTER — Ambulatory Visit: Payer: 59 | Admitting: Family

## 2022-08-14 ENCOUNTER — Other Ambulatory Visit (HOSPITAL_COMMUNITY)
Admission: RE | Admit: 2022-08-14 | Discharge: 2022-08-14 | Disposition: A | Discharge status: Home / Self Care | Payer: 59 | Source: Ambulatory Visit | Attending: Internal Medicine | Admitting: Internal Medicine

## 2022-08-14 DIAGNOSIS — C186 Malignant neoplasm of descending colon: Secondary | ICD-10-CM | POA: Insufficient documentation

## 2022-08-14 DIAGNOSIS — D126 Benign neoplasm of colon, unspecified: Secondary | ICD-10-CM | POA: Diagnosis present

## 2022-08-14 LAB — BASIC METABOLIC PANEL
Anion gap: 9 (ref 5–15)
BUN: 15 mg/dL (ref 6–20)
CO2: 26 mmol/L (ref 22–32)
Calcium: 9.3 mg/dL (ref 8.9–10.3)
Chloride: 97 mmol/L — ABNORMAL LOW (ref 98–111)
Creatinine, Ser: 0.92 mg/dL (ref 0.44–1.00)
GFR, Estimated: >60 mL/min (ref >60)
Glucose, Bld: 146 mg/dL — ABNORMAL HIGH (ref 70–99)
Potassium: 4 mmol/L (ref 3.5–5.1)
Sodium: 132 mmol/L — ABNORMAL LOW (ref 135–145)

## 2022-08-15 ENCOUNTER — Other Ambulatory Visit: Payer: Self-pay

## 2022-08-15 ENCOUNTER — Ambulatory Visit (HOSPITAL_COMMUNITY): Payer: 59 | Admitting: Anesthesiology

## 2022-08-15 ENCOUNTER — Encounter (HOSPITAL_COMMUNITY)
Admission: RE | Disposition: A | Discharge status: Home / Self Care | Payer: Self-pay | Source: Home / Self Care | Attending: Internal Medicine

## 2022-08-15 ENCOUNTER — Encounter (HOSPITAL_COMMUNITY): Payer: Self-pay

## 2022-08-15 ENCOUNTER — Ambulatory Visit (HOSPITAL_COMMUNITY)
Admission: RE | Admit: 2022-08-15 | Discharge: 2022-08-15 | Disposition: A | Discharge status: Home / Self Care | Payer: 59 | Attending: Internal Medicine | Admitting: Internal Medicine

## 2022-08-15 DIAGNOSIS — D125 Benign neoplasm of sigmoid colon: Secondary | ICD-10-CM | POA: Diagnosis not present

## 2022-08-15 DIAGNOSIS — Z87891 Personal history of nicotine dependence: Secondary | ICD-10-CM | POA: Diagnosis not present

## 2022-08-15 DIAGNOSIS — I10 Essential (primary) hypertension: Secondary | ICD-10-CM | POA: Diagnosis not present

## 2022-08-15 DIAGNOSIS — Z86010 Personal history of colonic polyps: Secondary | ICD-10-CM | POA: Diagnosis not present

## 2022-08-15 DIAGNOSIS — Z79899 Other long term (current) drug therapy: Secondary | ICD-10-CM | POA: Diagnosis not present

## 2022-08-15 DIAGNOSIS — Z85038 Personal history of other malignant neoplasm of large intestine: Secondary | ICD-10-CM | POA: Diagnosis not present

## 2022-08-15 DIAGNOSIS — D126 Benign neoplasm of colon, unspecified: Secondary | ICD-10-CM | POA: Diagnosis not present

## 2022-08-15 DIAGNOSIS — Z08 Encounter for follow-up examination after completed treatment for malignant neoplasm: Secondary | ICD-10-CM | POA: Diagnosis not present

## 2022-08-15 DIAGNOSIS — Z980 Intestinal bypass and anastomosis status: Secondary | ICD-10-CM | POA: Insufficient documentation

## 2022-08-15 DIAGNOSIS — Z9889 Other specified postprocedural states: Secondary | ICD-10-CM | POA: Insufficient documentation

## 2022-08-15 DIAGNOSIS — Z1211 Encounter for screening for malignant neoplasm of colon: Secondary | ICD-10-CM

## 2022-08-15 DIAGNOSIS — Z09 Encounter for follow-up examination after completed treatment for conditions other than malignant neoplasm: Secondary | ICD-10-CM | POA: Insufficient documentation

## 2022-08-15 HISTORY — PX: POLYPECTOMY: SHX5525

## 2022-08-15 HISTORY — DX: Personal history of urinary calculi: Z87.442

## 2022-08-15 HISTORY — PX: COLONOSCOPY WITH PROPOFOL: SHX5780

## 2022-08-15 SURGERY — COLONOSCOPY WITH PROPOFOL
Anesthesia: General

## 2022-08-15 MED ORDER — PROPOFOL 500 MG/50ML IV EMUL
INTRAVENOUS | Status: DC | PRN
  Administered 2022-08-15: 150 ug/kg/min via INTRAVENOUS

## 2022-08-15 MED ORDER — PROPOFOL 500 MG/50ML IV EMUL
INTRAVENOUS | Status: AC
  Filled 2022-08-15: qty 50

## 2022-08-15 MED ORDER — LACTATED RINGERS IV SOLN
INTRAVENOUS | Status: DC
  Administered 2022-08-15: 1000 mL via INTRAVENOUS

## 2022-08-15 MED ORDER — LIDOCAINE HCL (CARDIAC) PF 100 MG/5ML IV SOSY
PREFILLED_SYRINGE | INTRAVENOUS | Status: DC | PRN
  Administered 2022-08-15: 80 mg via INTRAVENOUS

## 2022-08-15 MED ORDER — PROPOFOL 10 MG/ML IV BOLUS
INTRAVENOUS | Status: DC | PRN
Start: 2022-08-15 — End: 2022-08-15
  Administered 2022-08-15: 100 mg via INTRAVENOUS

## 2022-08-15 NOTE — Transfer of Care (Addendum)
Immediate Anesthesia Transfer of Care Note  Patient: Alison Ramsey  Procedure(s) Performed: COLONOSCOPY WITH PROPOFOL POLYPECTOMY  Patient Location: PACU  Anesthesia Type:General  Level of Consciousness: drowsy and patient cooperative  Airway & Oxygen Therapy: Patient Spontanous Breathing and Patient connected to nasal cannula oxygen  Post-op Assessment: Report given to RN and Post -op Vital signs reviewed and stable  Post vital signs: Reviewed and stable  Last Vitals:  Vitals Value Taken Time  BP 119/72 08/15/22   1146  Temp 36.8 08/15/22   1146  Pulse 80 08/15/22 1146  Resp 20 08/15/22   1146  SpO2 94 % 08/15/22 1146    Last Pain:  Vitals:   08/15/22 1146  TempSrc: Oral  PainSc:       Patients Stated Pain Goal: 7 (08/15/22 1025)  Complications: No notable events documented.

## 2022-08-15 NOTE — Op Note (Signed)
[facility] Patient Name: Alison Ramsey Procedure Date: 08/15/2022 11:24 AM MRN: [id number] Date of Birth: 13-Oct-1970 Attending MD: Hennie Duos. Marletta Lor , [place], [id number] CSN: [id number] Age: 52 Admit Type: Outpatient Procedure:                Colonoscopy Indications:              High risk colon cancer surveillance: Personal                            history of adenoma with villous component, High                            risk colon cancer surveillance: Personal history of                            colon cancer Providers:                Hennie Duos. Marletta Lor, DO, Sheran Fava, Zena Amos Referring MD:              Medicines:                See the Anesthesia note for documentation of the                            administered medications Complications:            No immediate complications. Estimated Blood Loss:     Estimated blood loss was minimal. Procedure:                Pre-Anesthesia Assessment:                           - The anesthesia plan was to use monitored                            anesthesia care (MAC).                           After obtaining informed consent, the colonoscope                            was passed under direct vision. Throughout the                            procedure, the patient's blood pressure, pulse, and                            oxygen saturations were monitored continuously. The                            [id number]-HQ190L ([id number]) scope was introduced through                            the anus and advanced to the the ileocolonic  anastomosis. The colonoscopy was performed without                            difficulty. The patient tolerated the procedure                            well. The quality of the bowel preparation was good. Scope In: 11:34:16 AM Scope Out: 11:42:13 AM Scope Withdrawal Time: 0 hours 7 minutes 2 seconds  Total Procedure Duration: 0 hours 7 minutes 57 seconds   Findings:      There was evidence of a prior side-to-side ileo-colonic anastomosis in       the descending colon. This was patent and was characterized by erythema.       The anastomosis was traversed.      A 25 mm post polypectomy scar was found in the sigmoid colon. There was       small amount of residual polyp tissue. Removed with cold snare      The exam was otherwise without abnormality. Impression:               - Patent side-to-side ileo-colonic anastomosis,                            characterized by erythema.                           - Post-polypectomy scar in the sigmoid colon.                           - The examination was otherwise normal.                           - No specimens collected. Moderate Sedation:      Per Anesthesia Care Recommendation:           - Patient has a contact number available for                            emergencies. The signs and symptoms of potential                            delayed complications were discussed with the                            patient. Return to normal activities tomorrow.                            Written discharge instructions were provided to the                            patient.                           - Resume previous diet.                           - Continue present medications.                           -  Await pathology results.                           - Repeat colonoscopy in 3 years for surveillance.                           - Return to GI clinic PRN. Procedure Code(s):        --- Professional ---                           Z6109, Colorectal cancer screening; colonoscopy on                            individual at high risk Diagnosis Code(s):        --- Professional ---                           Z86.010, Personal history of colonic polyps                           Z85.038, Personal history of other malignant                            neoplasm of large intestine                           Z98.0,  Intestinal bypass and anastomosis status                           Z98.890, Other specified postprocedural states CPT copyright 2022 American [employer] Association. All rights reserved. The codes documented in this report are preliminary and upon coder review may  be revised to meet current compliance requirements. Hennie Duos. Marletta Lor, DO Hennie Duos. Marletta Lor, DO 08/15/2022 11:47:54 AM This report has been signed electronically. Number of Addenda: 0

## 2022-08-15 NOTE — Discharge Instructions (Addendum)
  Colonoscopy Discharge Instructions  Read the instructions outlined below and refer to this sheet in the next few weeks. These discharge instructions provide you with general information on caring for yourself after you leave the hospital. Your doctor may also give you specific instructions. While your treatment has been planned according to the most current medical practices available, unavoidable complications occasionally occur.   ACTIVITY You may resume your regular activity, but move at a slower pace for the next 24 hours.  Take frequent rest periods for the next 24 hours.  Walking will help get rid of the air and reduce the bloated feeling in your belly (abdomen).  No driving for 24 hours (because of the medicine (anesthesia) used during the test).   Do not sign any important legal documents or operate any machinery for 24 hours (because of the anesthesia used during the test).  NUTRITION Drink plenty of fluids.  You may resume your normal diet as instructed by your doctor.  Begin with a light meal and progress to your normal diet. Heavy or fried foods are harder to digest and may make you feel sick to your stomach (nauseated).  Avoid alcoholic beverages for 24 hours or as instructed.  MEDICATIONS You may resume your normal medications unless your doctor tells you otherwise.  WHAT YOU CAN EXPECT TODAY Some feelings of bloating in the abdomen.  Passage of more gas than usual.  Spotting of blood in your stool or on the toilet paper.  IF YOU HAD POLYPS REMOVED DURING THE COLONOSCOPY: No aspirin products for 7 days or as instructed.  No alcohol for 7 days or as instructed.  Eat a soft diet for the next 24 hours.  FINDING OUT THE RESULTS OF YOUR TEST Not all test results are available during your visit. If your test results are not back during the visit, make an appointment with your caregiver to find out the results. Do not assume everything is normal if you have not heard from your  caregiver or the medical facility. It is important for you to follow up on all of your test results.  SEEK IMMEDIATE MEDICAL ATTENTION IF: You have more than a spotting of blood in your stool.  Your belly is swollen (abdominal distention).  You are nauseated or vomiting.  You have a temperature over 101.  You have abdominal pain or discomfort that is severe or gets worse throughout the day.   Previous polypectomy site looked healthy.  Scar has healed up nicely.  There was a very small amount of residual polyp tissue which I removed today.  Await pathology results, my office will contact you.  Repeat colonoscopy in 3 years.  Otherwise follow-up with GI as needed.  I hope you have a great rest of your week!  Hennie Duos. Marletta Lor, D.O. Gastroenterology and Hepatology [facility]

## 2022-08-15 NOTE — Anesthesia Postprocedure Evaluation (Signed)
Anesthesia Post Note  Patient: Alison Ramsey  Procedure(s) Performed: COLONOSCOPY WITH PROPOFOL POLYPECTOMY  Patient location during evaluation: Phase II Anesthesia Type: General Level of consciousness: awake and alert and oriented Pain management: pain level controlled Vital Signs Assessment: post-procedure vital signs reviewed and stable Respiratory status: spontaneous breathing, nonlabored ventilation and respiratory function stable Cardiovascular status: blood pressure returned to baseline and stable Postop Assessment: no apparent nausea or vomiting Anesthetic complications: no  No notable events documented.   Last Vitals:  Vitals:   08/15/22 1025 08/15/22 1146  BP: (!) 168/91 119/72  Pulse: 84 80  Resp: 15 20  Temp: 36.7 C 36.8 C  SpO2: 99% 94%    Last Pain:  Vitals:   08/15/22 1146  TempSrc: Oral  PainSc:                   C 

## 2022-08-15 NOTE — Interval H&P Note (Signed)
History and Physical Interval Note:  08/15/2022 10:48 AM  Alison Ramsey  has presented today for surgery, with the diagnosis of SCREENING.  The various methods of treatment have been discussed with the patient and family. After consideration of risks, benefits and other options for treatment, the patient has consented to  Procedure(s) with comments: COLONOSCOPY WITH PROPOFOL (N/A) - 11:30AM;ASA 2 as a surgical intervention.  The patient's history has been reviewed, patient examined, no change in status, stable for surgery.  I have reviewed the patient's chart and labs.  Questions were answered to the patient's satisfaction.     Lanelle Bal

## 2022-08-15 NOTE — Anesthesia Preprocedure Evaluation (Addendum)
Anesthesia Evaluation  Patient identified by MRN, date of birth, ID band Patient awake    Reviewed: Allergy & Precautions, H&P , NPO status , Patient's Chart, lab work & pertinent test results  Airway Mallampati: II  TM Distance: >3 FB Neck ROM: Full    Dental  (+) Dental Advisory Given, Teeth Intact   Pulmonary former smoker   Pulmonary exam normal breath sounds clear to auscultation       Cardiovascular hypertension, Pt. on medications Normal cardiovascular exam Rhythm:Regular Rate:Normal     Neuro/Psych negative neurological ROS  negative psych ROS   GI/Hepatic Bowel prep,,,(+)     substance abuse  marijuana useColon cancer, colectomy   Endo/Other  negative endocrine ROS    Renal/GU negative Renal ROS  negative genitourinary   Musculoskeletal negative musculoskeletal ROS (+)    Abdominal   Peds negative pediatric ROS (+)  Hematology  (+) Blood dyscrasia, anemia   Anesthesia Other Findings   Reproductive/Obstetrics negative OB ROS                             Anesthesia Physical Anesthesia Plan  ASA: 2  Anesthesia Plan: General   Post-op Pain Management: Minimal or no pain anticipated   Induction: Intravenous  PONV Risk Score and Plan: 1 and Propofol infusion  Airway Management Planned: Nasal Cannula and Natural Airway  Additional Equipment:   Intra-op Plan:   Post-operative Plan:   Informed Consent: I have reviewed the patients History and Physical, chart, labs and discussed the procedure including the risks, benefits and alternatives for the proposed anesthesia with the patient or authorized representative who has indicated his/her understanding and acceptance.     Dental advisory given  Plan Discussed with: CRNA and Surgeon  Anesthesia Plan Comments:        Anesthesia Quick Evaluation

## 2022-08-19 ENCOUNTER — Encounter (HOSPITAL_COMMUNITY): Payer: Self-pay | Admitting: Internal Medicine

## 2022-08-26 ENCOUNTER — Inpatient Hospital Stay: Payer: 59 | Attending: Hematology

## 2022-08-26 DIAGNOSIS — Z85038 Personal history of other malignant neoplasm of large intestine: Secondary | ICD-10-CM | POA: Diagnosis present

## 2022-08-26 DIAGNOSIS — Z08 Encounter for follow-up examination after completed treatment for malignant neoplasm: Secondary | ICD-10-CM | POA: Diagnosis present

## 2022-08-26 DIAGNOSIS — C182 Malignant neoplasm of ascending colon: Secondary | ICD-10-CM

## 2022-08-26 LAB — IRON AND TIBC
Iron: 75 ug/dL (ref 28–170)
Saturation Ratios: 19 % (ref 10.4–31.8)
TIBC: 395 ug/dL (ref 250–450)
UIBC: 320 ug/dL

## 2022-08-26 LAB — CBC
HCT: 39.7 % (ref 36.0–46.0)
Hemoglobin: 13.7 g/dL (ref 12.0–15.0)
MCH: 28.4 pg (ref 26.0–34.0)
MCHC: 34.5 g/dL (ref 30.0–36.0)
MCV: 82.4 fL (ref 80.0–100.0)
Platelets: 243 10*3/uL (ref 150–400)
RBC: 4.82 MIL/uL (ref 3.87–5.11)
RDW: 14.2 % (ref 11.5–15.5)
WBC: 9.3 10*3/uL (ref 4.0–10.5)
nRBC: 0 % (ref 0.0–0.2)

## 2022-08-26 LAB — COMPREHENSIVE METABOLIC PANEL
ALT: 35 U/L (ref 0–44)
AST: 23 U/L (ref 15–41)
Albumin: 4.5 g/dL (ref 3.5–5.0)
Alkaline Phosphatase: 89 U/L (ref 38–126)
Anion gap: 9 (ref 5–15)
BUN: 12 mg/dL (ref 6–20)
CO2: 29 mmol/L (ref 22–32)
Calcium: 9.5 mg/dL (ref 8.9–10.3)
Chloride: 98 mmol/L (ref 98–111)
Creatinine, Ser: 0.87 mg/dL (ref 0.44–1.00)
GFR, Estimated: >60 mL/min (ref >60)
Glucose, Bld: 144 mg/dL — ABNORMAL HIGH (ref 70–99)
Potassium: 4 mmol/L (ref 3.5–5.1)
Sodium: 136 mmol/L (ref 135–145)
Total Bilirubin: 1 mg/dL (ref 0.3–1.2)
Total Protein: 7.7 g/dL (ref 6.5–8.1)

## 2022-08-26 LAB — FERRITIN: Ferritin: 52 ng/mL (ref 11–307)

## 2022-08-27 LAB — CEA: CEA: 2.3 ng/mL (ref 0.0–4.7)

## 2022-09-02 ENCOUNTER — Inpatient Hospital Stay: Payer: 59 | Admitting: Hematology

## 2022-09-02 ENCOUNTER — Telehealth: Payer: Self-pay | Admitting: Family Medicine

## 2022-09-02 VITALS — BP 192/110 | HR 92 | Temp 98.3°F | Resp 17 | Wt 237.2 lb

## 2022-09-02 DIAGNOSIS — Z08 Encounter for follow-up examination after completed treatment for malignant neoplasm: Secondary | ICD-10-CM | POA: Diagnosis not present

## 2022-09-02 DIAGNOSIS — C186 Malignant neoplasm of descending colon: Secondary | ICD-10-CM

## 2022-09-02 NOTE — Progress Notes (Signed)
[facility] [address] [place] [address]    Clinic Day:  09/02/2022  Referring physician: Junie Spencer, FNP  Patient Care Team: Junie Spencer, FNP as PCP - General (Family Medicine) [name], MD as Medical Oncologist (Medical Oncology) Therese Sarah, RN as Oncology Nurse Navigator (Medical Oncology)   ASSESSMENT & PLAN:   Assessment: 1. Stage IIIb (T3N1B) descending colon cancer: - Presentation with abdominal pain for few weeks to the ER.  History of IBS and had colonoscopy more than 10 years ago.  No weight loss. - CT CAP with contrast (04/12/2021): Obstructive 5 cm distal descending colon neoplasm with associated proximal stercoral colitis with no bowel perforation.  2 indeterminate right upper lobe lung nodules 1 calcified and 1 noncalcified measuring up to 5 mm.  2 approximately 5 mm hyperdense foci along the gallbladder lumen which may represent gallbladder polyp or stone. - Sigmoidoscopy (04/13/2021) fungating infiltrative obstructing large mass found in the descending colon. - Left hemicolectomy (04/15/2021): Moderately differentiated adenocarcinoma, grade 2, 2/35 lymph nodes positive, margins negative, PT3PN1B.  MMR preserved. - Preoperative CEA (04/12/2021): 2.5. - Adjuvant chemotherapy with 3 months of CapeOx was recommended with close monitoring of right lung nodules on subsequent scans. - 4 cycles of CapeOx from 05/20/2021 through 07/22/2021 - Germline mutation testing: Negative   2. Social/family history: - She lives at home with her husband.  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  No chemical exposure.  Quit smoking at age 64. - Maternal aunt had brain tumor.  Mother had breast cancer.  Maternal aunt died of colon cancer in her 41s.  Maternal first cousin died of colon cancer in his 30s.  Maternal uncle had stomach cancer.  Paternal aunt had breast cancer.  Maternal grandfather had lung cancer.    Plan: 1. Stage IIIb (T3N1B)  descending colon cancer, MMR stable: - Denies any change in bowel habits.  Denies any bleeding per rectum or melena. - Labs from 08/26/2022: Normal LFTs.  CBC normal.  CEA is 2.3. - She had colonoscopy on 08/15/2022, tubular adenoma from sigmoid colon removed. - Genetics were negative.  Signatera test from May was negative. - RTC 3 months with repeat CTAP, CEA, LFTs and Signatera test.  2.  Diarrhea: - Continue fiber supplements daily to aid normal stool consistency.  3.  Normocytic anemia: - She is no longer taking iron tablet.  Ferritin is 52 and percent saturation 19 and hemoglobin 13.7.    Orders Placed This Encounter  Procedures   CT ABDOMEN PELVIS W CONTRAST    Standing Status:   Future    Standing Expiration Date:   09/02/2023    Order Specific Question:   If indicated for the ordered procedure, I authorize the administration of contrast media per Radiology protocol    Answer:   Yes    Order Specific Question:   Does the patient have a contrast media/X-ray dye allergy?    Answer:   No    Order Specific Question:   Preferred imaging location?    Answer:   [facility]    Order Specific Question:   Release to patient    Answer:   Immediate [1]    Order Specific Question:   If indicated for the ordered procedure, I authorize the administration of oral contrast media per Radiology protocol    Answer:   Yes   CEA    Standing Status:   Future    Standing Expiration  Date:   09/02/2023   CBC with Differential/Platelet    Standing Status:   Future    Standing Expiration Date:   09/02/2023    Order Specific Question:   Release to patient    Answer:   Immediate   Comprehensive metabolic panel    Standing Status:   Future    Standing Expiration Date:   09/02/2023    Order Specific Question:   Release to patient    Answer:   Immediate      I,Helena R Teague,acting as a scribe for [name], MD.,have documented all relevant documentation on the behalf of [name], MD,as directed by  [name], MD while in the presence of [name], MD.  I, [name] MD, have reviewed the above documentation for accuracy and completeness, and I agree with the above.    [name], MD   8/27/20243:37 PM  CHIEF COMPLAINT:   Diagnosis: descending colon cancer    Cancer Staging  Colon cancer [facility]) Staging form: Colon and Rectum, AJCC 8th Edition - Clinical stage from 05/09/2021: Stage IIIB (cT3, cN1b, cM0) - Unsigned    Prior Therapy: 1. Left hemicolectomy (04/15/2021) 2.  4 cycles of CapeOx completed on 07/22/2021  Current Therapy:  surveillance   HISTORY OF PRESENT ILLNESS:   Oncology History  Colon cancer (HCC)  05/09/2021 Initial Diagnosis   Colon cancer (HCC)   05/20/2021 - 07/22/2021 Chemotherapy   Patient is on Treatment Plan : COLORECTAL Xelox (Capeox) q21d     07/17/2022 Genetic Testing   Negative genetic testing on the Multi-cancer + RNA panel.  The report date is July 17, 2022.  The Multi-Cancer + RNA Panel offered by [facility] includes sequencing and/or deletion/duplication analysis of the following 70 genes:  AIP*, ALK, APC*, ATM*, AXIN2*, BAP1*, BARD1*, BLM*, BMPR1A*, BRCA1*, BRCA2*, BRIP1*, CDC73*, CDH1*, CDK4, CDKN1B*, CDKN2A, CHEK2*, CTNNA1*, DICER1*, EPCAM (del/dup only), EGFR, FH*, FLCN*, GREM1 (promoter dup only), HOXB13, KIT, LZTR1, MAX*, MBD4, MEN1*, MET, MITF, MLH1*, MSH2*, MSH3*, MSH6*, MUTYH*, NF1*, NF2*, NTHL1*, PALB2*, PDGFRA, PMS2*, POLD1*, POLE*, POT1*, PRKAR1A*, PTCH1*, PTEN*, RAD51C*, RAD51D*, RB1*, RET, SDHA* (sequencing only), SDHAF2*, SDHB*, SDHC*, SDHD*, SMAD4*, SMARCA4*, SMARCB1*, SMARCE1*, STK11*, SUFU*, TMEM127*, TP53*, TSC1*, TSC2*, VHL*. RNA analysis is performed for * genes.      INTERVAL HISTORY:   Alison Ramsey is a 52 y.o. female presenting to clinic today for follow up of descending colon cancer. She was last seen by me on 05/27/22.  Since her last visit, she underwent a  colonoscopy on 08/15/22 with Dr. Marletta Lor. Surgical pathology of a polypectomy of the sigmoid colon revealed: tubular adenoma, negative for high-grade dysplasia or malignancy.  Today, she states that she is doing well overall. Her appetite level is at 100%. Her energy level is at 90%. She is accompanied by her husband.  She has not been taking iron. She takes Fiber supplements daily.  She has had an increase in blood pressure medication and has an appointment with her cardiologist next week. She takes her blood pressure at home and has reported that it is slightly higher than normal.   PAST MEDICAL HISTORY:   Past Medical History: Past Medical History:  Diagnosis Date   Cancer (HCC)    Colon cancer (HCC)    Family history of breast cancer    Family history of colon cancer    Family history of stomach cancer    History of kidney stones    Hypertension    Lyme disease 2018  Surgical History: Past Surgical History:  Procedure Laterality Date   ABDOMINAL HYSTERECTOMY  2013   APPENDECTOMY     BIOPSY  04/13/2021   Procedure: BIOPSY;  Surgeon: Lanelle Bal, DO;  [facility];  Service: Endoscopy;;  descending colon mass   BIOPSY  01/24/2022   Procedure: BIOPSY;  Surgeon: Lanelle Bal, DO;  [facility];  Service: Endoscopy;;   COLONOSCOPY WITH PROPOFOL N/A 01/24/2022   Procedure: COLONOSCOPY WITH PROPOFOL;  Surgeon: Lanelle Bal, DO;  [facility];  Service: Endoscopy;  Laterality: N/A;  1:00 pm   COLONOSCOPY WITH PROPOFOL N/A 08/15/2022   Procedure: COLONOSCOPY WITH PROPOFOL;  Surgeon: Lanelle Bal, DO;  [facility];  Service: Endoscopy;  Laterality: N/A;  11:30AM;ASA 2   FLEXIBLE SIGMOIDOSCOPY N/A 04/13/2021   Procedure: FLEXIBLE SIGMOIDOSCOPY;  Surgeon: Lanelle Bal, DO;  [facility];  Service: Endoscopy;  Laterality: N/A;   HEMOSTASIS CLIP PLACEMENT  01/24/2022   Procedure: HEMOSTASIS CLIP PLACEMENT;   Surgeon: Lanelle Bal, DO;  [facility];  Service: Endoscopy;;   PARTIAL COLECTOMY N/A 04/15/2021   Procedure: EXTENDED RIGHT HEMICOLECTOMY;  Surgeon: [name], MD;  Location: [facility];  Service: General;  Laterality: N/A;   POLYPECTOMY  01/24/2022   Procedure: POLYPECTOMY;  Surgeon: Lanelle Bal, DO;  [facility];  Service: Endoscopy;;   POLYPECTOMY  08/15/2022   Procedure: POLYPECTOMY;  Surgeon: Lanelle Bal, DO;  [facility];  Service: Endoscopy;;   PORTACATH PLACEMENT Left 05/13/2021   Procedure: INSERTION PORT-A-CATH;  Surgeon: [name], MD;  Location: [facility];  Service: General;  Laterality: Left;   SUBMUCOSAL LIFTING INJECTION  01/24/2022   Procedure: SUBMUCOSAL LIFTING INJECTION;  Surgeon: Lanelle Bal, DO;  [facility];  Service: Endoscopy;;   WISDOM TOOTH EXTRACTION      Social History: Social History   Socioeconomic History   Marital status: Married    Spouse name: Not on file   Number of children: Not on file   Years of education: Not on file   Highest education level: Not on file  Occupational History   Not on file  Tobacco Use   Smoking status: Former    Types: Cigarettes    Passive exposure: Past   Smokeless tobacco: Never  Vaping Use   Vaping status: Never Used  Substance and Sexual Activity   Alcohol use: Yes    Comment: occasionally   Drug use: Yes    Types: Marijuana   Sexual activity: Yes  Other Topics Concern   Not on file  Social History Narrative   Not on file   Social Determinants of Health   Financial Resource Strain: Not on file  Food Insecurity: Not on file  Transportation Needs: Not on file  Physical Activity: Not on file  Stress: Not on file  Social Connections: Not on file  Intimate Partner Violence: Not on file    Family History: Family History  Problem Relation Age of Onset   Kidney disease Mother    Hypertension Mother    Breast cancer Mother 51 - 31    Stroke Father    Colon cancer Maternal Aunt 49       d. 36   Other Maternal Aunt 75       pituitary cancer   Breast cancer Maternal Aunt 9   Stomach cancer Maternal Uncle 18   Breast cancer Paternal Aunt  Lung cancer Maternal Grandfather    Colon cancer Cousin        d. 30s; maternal first cousin    Current Medications:  Current Outpatient Medications:    ASHWAGANDHA PO, Take 1,000 mg by mouth at bedtime., Disp: , Rfl:    cetirizine (ZYRTEC) 10 MG tablet, Take 10 mg by mouth at bedtime., Disp: , Rfl:    Cyanocobalamin (VITAMIN B-12) 5000 MCG TBDP, Take 5,000 mcg by mouth at bedtime., Disp: , Rfl:    ferrous sulfate 325 (65 FE) MG tablet, Take 325 mg by mouth every evening., Disp: , Rfl:    FIBER PO, Take 5 g by mouth daily., Disp: , Rfl:    hydrochlorothiazide (HYDRODIURIL) 25 MG tablet, Take 1 tablet (25 mg total) by mouth daily., Disp: 90 tablet, Rfl: 3   Lactobacillus (ACIDOPHILUS PO), Take 1 capsule by mouth at bedtime., Disp: , Rfl:    lisinopril (ZESTRIL) 40 MG tablet, Take 1 tablet (40 mg total) by mouth daily. (Patient taking differently: Take 80 mg by mouth daily.), Disp: 90 tablet, Rfl: 3   loratadine (CLARITIN) 10 MG tablet, Take 10 mg by mouth at bedtime., Disp: , Rfl:    MAGNESIUM PO, Take 400 mg by mouth every evening., Disp: , Rfl:    Multiple Vitamins-Minerals (ZINC PO), Take 1 tablet by mouth at bedtime. One A Day, Disp: , Rfl:    VITAMIN D PO, Take 1,000 Units by mouth at bedtime., Disp: , Rfl:    Allergies: Allergies  Allergen Reactions   Aleve [Naproxen] Hives    Ok with ibuprofen   Codeine Hives, Nausea And Vomiting and Rash   Latex Rash    REVIEW OF SYSTEMS:   Review of Systems  Constitutional:  Negative for chills, fatigue and fever.  HENT:   Negative for lump/mass, mouth sores, nosebleeds, sore throat and trouble swallowing.   Eyes:  Negative for eye problems.  Respiratory:  Negative for cough and shortness of breath.   Cardiovascular:  Negative  for chest pain, leg swelling and palpitations.  Gastrointestinal:  Negative for abdominal pain, constipation, diarrhea, nausea and vomiting.  Genitourinary:  Negative for bladder incontinence, difficulty urinating, dysuria, frequency, hematuria and nocturia.   Musculoskeletal:  Negative for arthralgias, back pain, flank pain, myalgias and neck pain.  Skin:  Negative for itching and rash.  Neurological:  Negative for dizziness, headaches and numbness.  Hematological:  Does not bruise/bleed easily.  Psychiatric/Behavioral:  Negative for depression, sleep disturbance and suicidal ideas. The patient is not nervous/anxious.   All other systems reviewed and are negative.    VITALS:   Blood pressure (!) 192/110, pulse 92, temperature 98.3 F (36.8 C), temperature source Oral, resp. rate 17, weight 237 lb 3.2 oz (107.6 kg), SpO2 99%.  Wt Readings from Last 3 Encounters:  09/02/22 237 lb 3.2 oz (107.6 kg)  08/15/22 230 lb (104.3 kg)  07/30/22 238 lb 14.4 oz (108.4 kg)    Body mass index is 39.47 kg/m.  Performance status (ECOG): 0 - Asymptomatic  PHYSICAL EXAM:   Physical Exam Vitals and nursing note reviewed. Exam conducted with a chaperone present.  Constitutional:      Appearance: Normal appearance.  Cardiovascular:     Rate and Rhythm: Normal rate and regular rhythm.     Pulses: Normal pulses.     Heart sounds: Normal heart sounds.  Pulmonary:     Effort: Pulmonary effort is normal.     Breath sounds: Normal breath sounds.  Abdominal:  Palpations: Abdomen is soft. There is no hepatomegaly, splenomegaly or mass.     Tenderness: There is no abdominal tenderness.  Musculoskeletal:     Right lower leg: No edema.     Left lower leg: No edema.  Lymphadenopathy:     Cervical: No cervical adenopathy.     Right cervical: No superficial, deep or posterior cervical adenopathy.    Left cervical: No superficial, deep or posterior cervical adenopathy.     Upper Body:     Right upper  body: No supraclavicular or axillary adenopathy.     Left upper body: No supraclavicular or axillary adenopathy.  Neurological:     General: No focal deficit present.     Mental Status: She is alert and oriented to person, place, and time.  Psychiatric:        Mood and Affect: Mood normal.        Behavior: Behavior normal.     LABS:      Latest Ref Rng & Units 08/26/2022    2:18 PM 05/01/2022    2:00 PM 01/23/2022    2:42 PM  CBC  WBC 4.0 - 10.5 K/uL 9.3  7.3  7.1   Hemoglobin 12.0 - 15.0 g/dL 56.4  33.2  95.1   Hematocrit 36.0 - 46.0 % 39.7  36.1  39.2   Platelets 150 - 400 K/uL 243  213  248       Latest Ref Rng & Units 08/26/2022    2:18 PM 08/14/2022   11:41 AM 06/12/2022   10:19 AM  CMP  Glucose 70 - 99 mg/dL 884  166  063   BUN 6 - 20 mg/dL 12  15  8    Creatinine 0.44 - 1.00 mg/dL 0.16  0.10  9.32   Sodium 135 - 145 mmol/L 136  132  137   Potassium 3.5 - 5.1 mmol/L 4.0  4.0  5.0   Chloride 98 - 111 mmol/L 98  97  100   CO2 22 - 32 mmol/L 29  26  24    Calcium 8.9 - 10.3 mg/dL 9.5  9.3  9.6   Total Protein 6.5 - 8.1 g/dL 7.7     Total Bilirubin 0.3 - 1.2 mg/dL 1.0     Alkaline Phos 38 - 126 U/L 89     AST 15 - 41 U/L 23     ALT 0 - 44 U/L 35        Lab Results  Component Value Date   CEA1 2.3 08/26/2022   /  CEA  Date Value Ref Range Status  08/26/2022 2.3 0.0 - 4.7 ng/mL Final    Comment:    (NOTE)                             Nonsmokers          <3.9                             Smokers             <5.6 [employer] Electrochemiluminescence Immunoassay (ECLIA) Values obtained with different assay methods or kits cannot be used interchangeably.  Results cannot be interpreted as absolute evidence of the presence or absence of malignant disease. Performed At: [facility] [address] [place], [place] [id number] Jolene Schimke MD RK:[id number]    No results found for: "PSA1" No results found  for: "LKG401" Lab Results  Component Value Date    CAN125 10.2 04/12/2021    No results found for: "TOTALPROTELP", "ALBUMINELP", "A1GS", "A2GS", "BETS", "BETA2SER", "GAMS", "MSPIKE", "SPEI" Lab Results  Component Value Date   TIBC 395 08/26/2022   TIBC 358 10/22/2021   TIBC 464 (H) 07/22/2021   FERRITIN 52 08/26/2022   FERRITIN 47 10/22/2021   FERRITIN 24 07/22/2021   IRONPCTSAT 19 08/26/2022   IRONPCTSAT 19 10/22/2021   IRONPCTSAT 15 07/22/2021   No results found for: "LDH"   STUDIES:   No results found.

## 2022-09-02 NOTE — Patient Instructions (Signed)
[facility] [place]  Discharge Instructions  You were seen and examined today by Dr. Katragadda.  Dr. Katragadda discussed your most recent lab work and CT scan which revealed that everything looks good and stable.   Follow-up as scheduled in 3 months.    Thank you for choosing [facility] [place] [facility] to provide your oncology and hematology care.   To afford each patient quality time with our provider, please arrive at least 15 minutes before your scheduled appointment time. You may need to reschedule your appointment if you arrive late (10 or more minutes). Arriving late affects you and other patients whose appointments are after yours.  Also, if you miss three or more appointments without notifying the office, you may be dismissed from the clinic at the provider's discretion.    Again, thank you for choosing [facility].  Our hope is that these requests will decrease the amount of time that you wait before being seen by our physicians.   If you have a lab appointment with the Cancer Center - please note that after April 8th, all labs will be drawn in the cancer center.  You do not have to check in or register with the main entrance as you have in the past but will complete your check-in at the cancer center.            _____________________________________________________________  Should you have questions after your visit to [facility], please contact our office at [phone] and follow the prompts.  Our office hours are 8:00 a.m. to 4:30 p.m. Monday - Thursday and 8:00 a.m. to 2:30 p.m. Friday.  Please note that voicemails left after 4:00 p.m. may not be returned until the following business day.  We are closed weekends and all major holidays.  You do have access to a nurse 24-7, just call the main number to the clinic [phone] and do not press any options, hold on the line and a nurse will answer the phone.    For  prescription refill requests, have your pharmacy contact our office and allow 72 hours.    Masks are no longer required in the cancer centers. If you would like for your care team to wear a mask while they are taking care of you, please let them know. You may have one support person who is at least 52 years old accompany you for your appointments.  

## 2022-09-05 ENCOUNTER — Encounter: Payer: Self-pay | Admitting: Family

## 2022-09-05 ENCOUNTER — Ambulatory Visit: Payer: 59 | Admitting: Family

## 2022-09-05 VITALS — BP 152/103 | HR 83 | Temp 97.7°F | Ht 65.0 in | Wt 235.6 lb

## 2022-09-05 DIAGNOSIS — I10 Essential (primary) hypertension: Secondary | ICD-10-CM | POA: Diagnosis not present

## 2022-09-05 MED ORDER — AMLODIPINE BESYLATE 10 MG PO TABS: 10.0000 mg | ORAL_TABLET | Freq: Every day | ORAL | 1 refills | Status: DC

## 2022-09-05 NOTE — Patient Instructions (Signed)
Hypertension, Adult High blood pressure (hypertension) is when the force of blood pumping through the arteries is too strong. The arteries are the blood vessels that carry blood from the heart throughout the body. Hypertension forces the heart to work harder to pump blood and may cause arteries to become narrow or stiff. Untreated or uncontrolled hypertension can lead to a heart attack, heart failure, a stroke, kidney disease, and other problems. A blood pressure reading consists of a higher number over a lower number. Ideally, your blood pressure should be below 120/80. The first ("top") number is called the systolic pressure. It is a measure of the pressure in your arteries as your heart beats. The second ("bottom") number is called the diastolic pressure. It is a measure of the pressure in your arteries as the heart relaxes. What are the causes? The exact cause of this condition is not known. There are some conditions that result in high blood pressure. What increases the risk? Certain factors may make you more likely to develop high blood pressure. Some of these risk factors are under your control, including: Smoking. Not getting enough exercise or physical activity. Being overweight. Having too much fat, sugar, calories, or salt (sodium) in your diet. Drinking too much alcohol. Other risk factors include: Having a personal history of heart disease, diabetes, high cholesterol, or kidney disease. Stress. Having a family history of high blood pressure and high cholesterol. Having obstructive sleep apnea. Age. The risk increases with age. What are the signs or symptoms? High blood pressure may not cause symptoms. Very high blood pressure (hypertensive crisis) may cause: Headache. Fast or irregular heartbeats (palpitations). Shortness of breath. Nosebleed. Nausea and vomiting. Vision changes. Severe chest pain, dizziness, and seizures. How is this diagnosed? This condition is diagnosed by  measuring your blood pressure while you are seated, with your arm resting on a flat surface, your legs uncrossed, and your feet flat on the floor. The cuff of the blood pressure monitor will be placed directly against the skin of your upper arm at the level of your heart. Blood pressure should be measured at least twice using the same arm. Certain conditions can cause a difference in blood pressure between your right and left arms. If you have a high blood pressure reading during one visit or you have normal blood pressure with other risk factors, you may be asked to: Return on a different day to have your blood pressure checked again. Monitor your blood pressure at home for 1 week or longer. If you are diagnosed with hypertension, you may have other blood or imaging tests to help your health care provider understand your overall risk for other conditions. How is this treated? This condition is treated by making healthy lifestyle changes, such as eating healthy foods, exercising more, and reducing your alcohol intake. You may be referred for counseling on a healthy diet and physical activity. Your health care provider may prescribe medicine if lifestyle changes are not enough to get your blood pressure under control and if: Your systolic blood pressure is above 130. Your diastolic blood pressure is above 80. Your personal target blood pressure may vary depending on your medical conditions, your age, and other factors. Follow these instructions at home: Eating and drinking  Eat a diet that is high in fiber and potassium, and low in sodium, added sugar, and fat. An example of this eating plan is called the DASH diet. DASH stands for Dietary Approaches to Stop Hypertension. To eat this way: Eat   plenty of fresh fruits and vegetables. Try to fill one half of your plate at each meal with fruits and vegetables. Eat whole grains, such as whole-wheat pasta, brown rice, or whole-grain bread. Fill about one  fourth of your plate with whole grains. Eat or drink low-fat dairy products, such as skim milk or low-fat yogurt. Avoid fatty cuts of meat, processed or cured meats, and poultry with skin. Fill about one fourth of your plate with lean proteins, such as fish, chicken without skin, beans, eggs, or tofu. Avoid pre-made and processed foods. These tend to be higher in sodium, added sugar, and fat. Reduce your daily sodium intake. Many people with hypertension should eat less than 1,500 mg of sodium a day. Do not drink alcohol if: Your health care provider tells you not to drink. You are pregnant, may be pregnant, or are planning to become pregnant. If you drink alcohol: Limit how much you have to: 0-1 drink a day for women. 0-2 drinks a day for men. Know how much alcohol is in your drink. In the [place] one drink equals one 12 oz bottle of beer (355 mL), one 5 oz glass of wine (148 mL), or one 1 oz glass of hard liquor (44 mL). Lifestyle  Work with your health care provider to maintain a healthy body weight or to lose weight. Ask what an ideal weight is for you. Get at least 30 minutes of exercise that causes your heart to beat faster (aerobic exercise) most days of the week. Activities may include walking, swimming, or biking. Include exercise to strengthen your muscles (resistance exercise), such as Pilates or lifting weights, as part of your weekly exercise routine. Try to do these types of exercises for 30 minutes at least 3 days a week. Do not use any products that contain nicotine or tobacco. These products include cigarettes, chewing tobacco, and vaping devices, such as e-cigarettes. If you need help quitting, ask your health care provider. Monitor your blood pressure at home as told by your health care provider. Keep all follow-up visits. This is important. Medicines Take over-the-counter and prescription medicines only as told by your health care provider. Follow directions carefully. Blood  pressure medicines must be taken as prescribed. Do not skip doses of blood pressure medicine. Doing this puts you at risk for problems and can make the medicine less effective. Ask your health care provider about side effects or reactions to medicines that you should watch for. Contact a health care provider if you: Think you are having a reaction to a medicine you are taking. Have headaches that keep coming back (recurring). Feel dizzy. Have swelling in your ankles. Have trouble with your vision. Get help right away if you: Develop a severe headache or confusion. Have unusual weakness or numbness. Feel faint. Have severe pain in your chest or abdomen. Vomit repeatedly. Have trouble breathing. These symptoms may be an emergency. Get help right away. Call 911. Do not wait to see if the symptoms will go away. Do not drive yourself to the hospital. Summary Hypertension is when the force of blood pumping through your arteries is too strong. If this condition is not controlled, it may put you at risk for serious complications. Your personal target blood pressure may vary depending on your medical conditions, your age, and other factors. For most people, a normal blood pressure is less than 120/80. Hypertension is treated with lifestyle changes, medicines, or a combination of both. Lifestyle changes include losing weight, eating a healthy,   low-sodium diet, exercising more, and limiting alcohol. This information is not intended to replace advice given to you by your health care provider. Make sure you discuss any questions you have with your health care provider. Document Revised: 10/30/2020 Document Reviewed: 10/30/2020 Elsevier Patient Education  2024 Elsevier Inc.  

## 2022-09-05 NOTE — Progress Notes (Signed)
Subjective:    Patient ID: Alison Ramsey, female    DOB: 03-21-1970, 52 y.o.   MRN: [id number] Chief Complaint  Patient presents with   Hypertension   PT presents to the office today for HTN. We increased her lisinopril to 40 mg from 20 mg. We started hydrochlorothiazide 25 mg. 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  Hypertension This is a chronic problem. The current episode started more than 1 year ago. The problem has been waxing and waning since onset. The problem is uncontrolled. Associated symptoms include peripheral edema. Pertinent negatives include no malaise/fatigue or shortness of breath. Risk factors for coronary artery disease include dyslipidemia, obesity and sedentary lifestyle. Past treatments include ACE inhibitors. The current treatment provides moderate improvement.      Review of Systems  Constitutional:  Negative for malaise/fatigue.  Respiratory:  Negative for shortness of breath.   All other systems reviewed and are negative.      Objective:   Physical Exam Vitals reviewed.  Constitutional:      General: She is not in acute distress.    Appearance: She is well-developed.  HENT:     Head: Normocephalic and atraumatic.     Right Ear: Tympanic membrane normal.     Left Ear: Tympanic membrane normal.  Eyes:     Pupils: Pupils are equal, round, and reactive to light.  Neck:     Thyroid: No thyromegaly.  Cardiovascular:     Rate and Rhythm: Normal rate and regular rhythm.     Heart sounds: Normal heart sounds. No murmur heard. Pulmonary:     Effort: Pulmonary effort is normal. No respiratory distress.     Breath sounds: Normal breath sounds. No wheezing.  Abdominal:     General: Bowel sounds are normal. There is no distension.     Palpations: Abdomen is soft.     Tenderness: There is no abdominal tenderness.  Musculoskeletal:        General: No tenderness. Normal range of motion.     Cervical back: Normal range of motion and neck supple.  Skin:     General: Skin is warm and dry.  Neurological:     Mental Status: She is alert and oriented to person, place, and time.     Cranial Nerves: No cranial nerve deficit.     Deep Tendon Reflexes: Reflexes are normal and symmetric.  Psychiatric:        Mood and Affect: Mood is anxious.        Behavior: Behavior normal.        Thought Content: Thought content normal.        Judgment: Judgment normal.      BP (!) 152/103   Pulse 83   Temp 97.7 F (36.5 C) (Temporal)   Ht 5\' 5"  (1.651 m)   Wt 235 lb 9.6 oz (106.9 kg)   SpO2 99%   BMI 39.21 kg/m      Assessment & Plan:  Alison Ramsey comes in today with chief complaint of Hypertension   Diagnosis and orders addressed:  1. Primary hypertension Will stop hydrochlorothiazide 25 mg related to hives  Start Norvasc 10 mg  Pt will monitor BP at home and >140/90 will let me know and will add spirolactone  -Daily blood pressure log given with instructions on how to fill out and told to bring to next visit -Dash diet information given -Exercise encouraged - Stress Management  -Follow up if symptoms worsen or do not improve  -  amLODipine (NORVASC) 10 MG tablet; Take 1 tablet (10 mg total) by mouth daily.  Dispense: 90 tablet; Refill: 1 - BMP8+EGFR   Follow up plan: 2 weeks to recheck HTN  Jannifer Rodney, FNP

## 2022-09-06 LAB — BMP8+EGFR
BUN/Creatinine Ratio: 14 (ref 9–23)
BUN: 13 mg/dL (ref 6–24)
CO2: 24 mmol/L (ref 20–29)
Calcium: 9.9 mg/dL (ref 8.7–10.2)
Chloride: 98 mmol/L (ref 96–106)
Creatinine, Ser: 0.92 mg/dL (ref 0.57–1.00)
Glucose: 121 mg/dL — ABNORMAL HIGH (ref 70–99)
Potassium: 4.5 mmol/L (ref 3.5–5.2)
Sodium: 138 mmol/L (ref 134–144)
eGFR: 75 mL/min/{1.73_m2} (ref >59)

## 2022-09-09 ENCOUNTER — Other Ambulatory Visit: Payer: Self-pay

## 2022-09-09 ENCOUNTER — Inpatient Hospital Stay: Payer: 59 | Attending: Hematology

## 2022-09-09 DIAGNOSIS — Z85038 Personal history of other malignant neoplasm of large intestine: Secondary | ICD-10-CM | POA: Insufficient documentation

## 2022-09-09 DIAGNOSIS — R7309 Other abnormal glucose: Secondary | ICD-10-CM

## 2022-09-09 DIAGNOSIS — Z452 Encounter for adjustment and management of vascular access device: Secondary | ICD-10-CM | POA: Insufficient documentation

## 2022-09-11 ENCOUNTER — Inpatient Hospital Stay: Payer: 59

## 2022-09-11 VITALS — BP 173/109 | HR 87 | Temp 98.3°F | Resp 18

## 2022-09-11 DIAGNOSIS — C186 Malignant neoplasm of descending colon: Secondary | ICD-10-CM

## 2022-09-11 DIAGNOSIS — Z452 Encounter for adjustment and management of vascular access device: Secondary | ICD-10-CM | POA: Diagnosis present

## 2022-09-11 DIAGNOSIS — Z85038 Personal history of other malignant neoplasm of large intestine: Secondary | ICD-10-CM | POA: Diagnosis present

## 2022-09-11 MED ORDER — SODIUM CHLORIDE 0.9% FLUSH
10.0000 mL | Freq: Once | INTRAVENOUS | Status: AC
  Administered 2022-09-11: 10 mL via INTRAVENOUS

## 2022-09-11 MED ORDER — HEPARIN SOD (PORK) LOCK FLUSH 100 UNIT/ML IV SOLN
500.0000 [IU] | Freq: Once | INTRAVENOUS | Status: AC
  Administered 2022-09-11: 500 [IU] via INTRAVENOUS

## 2022-09-11 NOTE — Progress Notes (Signed)
Patients port flushed without difficulty.  Good blood return noted with no bruising or swelling noted at site.  Band aid applied.  VSS with discharge and left in satisfactory condition with no s/s of distress noted.   

## 2022-09-23 ENCOUNTER — Encounter: Payer: Self-pay | Admitting: Family

## 2022-09-23 ENCOUNTER — Ambulatory Visit: Payer: 59 | Admitting: Family

## 2022-09-23 VITALS — BP 146/93 | HR 94 | Temp 97.7°F | Ht 65.0 in | Wt 234.6 lb

## 2022-09-23 DIAGNOSIS — J0100 Acute maxillary sinusitis, unspecified: Secondary | ICD-10-CM | POA: Diagnosis not present

## 2022-09-23 DIAGNOSIS — I10 Essential (primary) hypertension: Secondary | ICD-10-CM | POA: Diagnosis not present

## 2022-09-23 MED ORDER — SPIRONOLACTONE 25 MG PO TABS
12.5000 mg | ORAL_TABLET | Freq: Every day | ORAL | 3 refills | Status: DC
Start: 2022-09-23 — End: 2022-12-15

## 2022-09-23 MED ORDER — AMOXICILLIN-POT CLAVULANATE 875-125 MG PO TABS
1.0000 | ORAL_TABLET | Freq: Two times a day (BID) | ORAL | 0 refills | Status: DC
Start: 2022-09-23 — End: 2022-12-09

## 2022-09-23 NOTE — Progress Notes (Signed)
Subjective:    Patient ID: Alison Ramsey, female    DOB: June 27, 1970, 52 y.o.   MRN: [id number]  Chief Complaint  Patient presents with   Hypertension   URI   PT presents to the office today for HTN. We increased her lisinopril to 40 mg from 20 mg. We started hydrochlorothiazide 25 mg. 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 However, once she stopped the hydrochlorothiazide she continued to get hives. She reports her hives stopped 09/16/22 and she Norvas 10 mg then.    She is complaining of URI symptoms. She did take a decongestant today.  Hypertension This is a chronic problem. The current episode started more than 1 year ago. The problem has been resolved since onset. The problem is controlled. Associated symptoms include headaches. Pertinent negatives include no malaise/fatigue, peripheral edema or shortness of breath.  URI  This is a new problem. The current episode started in the past 7 days. The problem has been waxing and waning. Associated symptoms include congestion, coughing, ear pain, headaches, rhinorrhea, sinus pain, sneezing and a sore throat. She has tried decongestant and increased fluids for the symptoms. The treatment provided mild relief.  Cough This is a new problem. The current episode started 1 to 4 weeks ago. The problem has been gradually worsening. The problem occurs every few minutes. The cough is Productive of brown sputum and productive of purulent sputum. Associated symptoms include ear pain, headaches, rhinorrhea and a sore throat. Pertinent negatives include no shortness of breath. She has tried rest for the symptoms.      Review of Systems  Constitutional:  Negative for malaise/fatigue.  HENT:  Positive for congestion, ear pain, rhinorrhea, sinus pain, sneezing and sore throat.   Respiratory:  Positive for cough. Negative for shortness of breath.   Neurological:  Positive for headaches.  All other systems  reviewed and are negative.      Objective:   Physical Exam Vitals reviewed.  Constitutional:      General: She is not in acute distress.    Appearance: She is well-developed. She is obese.  HENT:     Head: Normocephalic and atraumatic.     Right Ear: A middle ear effusion is present.     Left Ear: A middle ear effusion is present.     Mouth/Throat:     Pharynx: Posterior oropharyngeal erythema present.  Eyes:     Pupils: Pupils are equal, round, and reactive to light.  Neck:     Thyroid: No thyromegaly.  Cardiovascular:     Rate and Rhythm: Normal rate and regular rhythm.     Heart sounds: Normal heart sounds. No murmur heard. Pulmonary:     Effort: Pulmonary effort is normal. No respiratory distress.     Breath sounds: Normal breath sounds. No wheezing.  Abdominal:     General: Bowel sounds are normal. There is no distension.     Palpations: Abdomen is soft.     Tenderness: There is no abdominal tenderness.  Musculoskeletal:        General: No tenderness. Normal range of motion.     Cervical back: Normal range of motion and neck supple.  Skin:    General: Skin is warm and dry.  Neurological:     Mental Status: She is alert and oriented to person, place, and time.     Cranial Nerves: No cranial nerve deficit.     Deep Tendon Reflexes:  Reflexes are normal and symmetric.  Psychiatric:        Behavior: Behavior normal.        Thought Content: Thought content normal.        Judgment: Judgment normal.       BP (!) 148/99   Pulse 94   Temp 97.7 F (36.5 C) (Temporal)   Ht 5\' 5"  (1.651 m)   Wt 234 lb 9.6 oz (106.4 kg)   SpO2 93%   BMI 39.04 kg/m      Assessment & Plan:  Alison Ramsey comes in today with chief complaint of Hypertension and URI   Diagnosis and orders addressed:  1. Primary hypertension Start spironolactone 12.5 mg  -Daily blood pressure log given with instructions on how to fill out and told to bring to next visit -Dash diet information  given -Exercise encouraged - Stress Management  -Continue current meds -RTO in 2 weks  - spironolactone (ALDACTONE) 25 MG tablet; Take 0.5 tablets (12.5 mg total) by mouth daily.  Dispense: 90 tablet; Refill: 3 - BMP8+EGFR  2. Acute non-recurrent maxillary sinusitis - Take meds as prescribed - Use a cool mist humidifier  -Use saline nose sprays frequently -Force fluids -For any cough or congestion  Use plain Mucinex- regular strength or max strength is fine -For fever or aces or pains- take tylenol or ibuprofen. -Throat lozenges if help -Start Augmentin  - amoxicillin-clavulanate (AUGMENTIN) 875-125 MG tablet; Take 1 tablet by mouth 2 (two) times daily.  Dispense: 14 tablet; Refill: 0 - BMP8+EGFR   Labs pending Health Maintenance reviewed Diet and exercise encouraged  Follow up plan: 2 weeks    Jannifer Rodney, FNP

## 2022-09-23 NOTE — Patient Instructions (Signed)
Hypertension, Adult High blood pressure (hypertension) is when the force of blood pumping through the arteries is too strong. The arteries are the blood vessels that carry blood from the heart throughout the body. Hypertension forces the heart to work harder to pump blood and may cause arteries to become narrow or stiff. Untreated or uncontrolled hypertension can lead to a heart attack, heart failure, a stroke, kidney disease, and other problems. A blood pressure reading consists of a higher number over a lower number. Ideally, your blood pressure should be below 120/80. The first ("top") number is called the systolic pressure. It is a measure of the pressure in your arteries as your heart beats. The second ("bottom") number is called the diastolic pressure. It is a measure of the pressure in your arteries as the heart relaxes. What are the causes? The exact cause of this condition is not known. There are some conditions that result in high blood pressure. What increases the risk? Certain factors may make you more likely to develop high blood pressure. Some of these risk factors are under your control, including: Smoking. Not getting enough exercise or physical activity. Being overweight. Having too much fat, sugar, calories, or salt (sodium) in your diet. Drinking too much alcohol. Other risk factors include: Having a personal history of heart disease, diabetes, high cholesterol, or kidney disease. Stress. Having a family history of high blood pressure and high cholesterol. Having obstructive sleep apnea. Age. The risk increases with age. What are the signs or symptoms? High blood pressure may not cause symptoms. Very high blood pressure (hypertensive crisis) may cause: Headache. Fast or irregular heartbeats (palpitations). Shortness of breath. Nosebleed. Nausea and vomiting. Vision changes. Severe chest pain, dizziness, and seizures. How is this diagnosed? This condition is diagnosed by  measuring your blood pressure while you are seated, with your arm resting on a flat surface, your legs uncrossed, and your feet flat on the floor. The cuff of the blood pressure monitor will be placed directly against the skin of your upper arm at the level of your heart. Blood pressure should be measured at least twice using the same arm. Certain conditions can cause a difference in blood pressure between your right and left arms. If you have a high blood pressure reading during one visit or you have normal blood pressure with other risk factors, you may be asked to: Return on a different day to have your blood pressure checked again. Monitor your blood pressure at home for 1 week or longer. If you are diagnosed with hypertension, you may have other blood or imaging tests to help your health care provider understand your overall risk for other conditions. How is this treated? This condition is treated by making healthy lifestyle changes, such as eating healthy foods, exercising more, and reducing your alcohol intake. You may be referred for counseling on a healthy diet and physical activity. Your health care provider may prescribe medicine if lifestyle changes are not enough to get your blood pressure under control and if: Your systolic blood pressure is above 130. Your diastolic blood pressure is above 80. Your personal target blood pressure may vary depending on your medical conditions, your age, and other factors. Follow these instructions at home: Eating and drinking  Eat a diet that is high in fiber and potassium, and low in sodium, added sugar, and fat. An example of this eating plan is called the DASH diet. DASH stands for Dietary Approaches to Stop Hypertension. To eat this way: Eat  plenty of fresh fruits and vegetables. Try to fill one half of your plate at each meal with fruits and vegetables. Eat whole grains, such as whole-wheat pasta, brown rice, or whole-grain bread. Fill about one  fourth of your plate with whole grains. Eat or drink low-fat dairy products, such as skim milk or low-fat yogurt. Avoid fatty cuts of meat, processed or cured meats, and poultry with skin. Fill about one fourth of your plate with lean proteins, such as fish, chicken without skin, beans, eggs, or tofu. Avoid pre-made and processed foods. These tend to be higher in sodium, added sugar, and fat. Reduce your daily sodium intake. Many people with hypertension should eat less than 1,500 mg of sodium a day. Do not drink alcohol if: Your health care provider tells you not to drink. You are pregnant, may be pregnant, or are planning to become pregnant. If you drink alcohol: Limit how much you have to: 0-1 drink a day for women. 0-2 drinks a day for men. Know how much alcohol is in your drink. In the [place] one drink equals one 12 oz bottle of beer (355 mL), one 5 oz glass of wine (148 mL), or one 1 oz glass of hard liquor (44 mL). Lifestyle  Work with your health care provider to maintain a healthy body weight or to lose weight. Ask what an ideal weight is for you. Get at least 30 minutes of exercise that causes your heart to beat faster (aerobic exercise) most days of the week. Activities may include walking, swimming, or biking. Include exercise to strengthen your muscles (resistance exercise), such as Pilates or lifting weights, as part of your weekly exercise routine. Try to do these types of exercises for 30 minutes at least 3 days a week. Do not use any products that contain nicotine or tobacco. These products include cigarettes, chewing tobacco, and vaping devices, such as e-cigarettes. If you need help quitting, ask your health care provider. Monitor your blood pressure at home as told by your health care provider. Keep all follow-up visits. This is important. Medicines Take over-the-counter and prescription medicines only as told by your health care provider. Follow directions carefully. Blood  pressure medicines must be taken as prescribed. Do not skip doses of blood pressure medicine. Doing this puts you at risk for problems and can make the medicine less effective. Ask your health care provider about side effects or reactions to medicines that you should watch for. Contact a health care provider if you: Think you are having a reaction to a medicine you are taking. Have headaches that keep coming back (recurring). Feel dizzy. Have swelling in your ankles. Have trouble with your vision. Get help right away if you: Develop a severe headache or confusion. Have unusual weakness or numbness. Feel faint. Have severe pain in your chest or abdomen. Vomit repeatedly. Have trouble breathing. These symptoms may be an emergency. Get help right away. Call 911. Do not wait to see if the symptoms will go away. Do not drive yourself to the hospital. Summary Hypertension is when the force of blood pumping through your arteries is too strong. If this condition is not controlled, it may put you at risk for serious complications. Your personal target blood pressure may vary depending on your medical conditions, your age, and other factors. For most people, a normal blood pressure is less than 120/80. Hypertension is treated with lifestyle changes, medicines, or a combination of both. Lifestyle changes include losing weight, eating a healthy,  low-sodium diet, exercising more, and limiting alcohol. This information is not intended to replace advice given to you by your health care provider. Make sure you discuss any questions you have with your health care provider. Document Revised: 10/30/2020 Document Reviewed: 10/30/2020 Elsevier Patient Education  2024 [employer].

## 2022-09-30 ENCOUNTER — Telehealth: Payer: 59 | Admitting: Physician Assistant

## 2022-09-30 DIAGNOSIS — B379 Candidiasis, unspecified: Secondary | ICD-10-CM

## 2022-09-30 DIAGNOSIS — T3695XA Adverse effect of unspecified systemic antibiotic, initial encounter: Secondary | ICD-10-CM

## 2022-09-30 MED ORDER — FLUCONAZOLE 150 MG PO TABS
150.0000 mg | ORAL_TABLET | ORAL | 0 refills | Status: DC | PRN
Start: 2022-09-30 — End: 2022-12-29

## 2022-09-30 NOTE — Progress Notes (Signed)

## 2022-10-07 ENCOUNTER — Ambulatory Visit: Payer: 59 | Admitting: Family

## 2022-10-08 ENCOUNTER — Encounter: Payer: Self-pay | Admitting: Family

## 2022-12-01 ENCOUNTER — Inpatient Hospital Stay: Payer: 59

## 2022-12-02 ENCOUNTER — Ambulatory Visit (HOSPITAL_COMMUNITY): Admission: RE | Admit: 2022-12-02 | Payer: 59 | Source: Ambulatory Visit

## 2022-12-02 ENCOUNTER — Inpatient Hospital Stay: Payer: 59

## 2022-12-02 ENCOUNTER — Other Ambulatory Visit: Payer: 59

## 2022-12-02 ENCOUNTER — Ambulatory Visit (HOSPITAL_BASED_OUTPATIENT_CLINIC_OR_DEPARTMENT_OTHER)
Admission: RE | Admit: 2022-12-02 | Discharge: 2022-12-02 | Disposition: A | Discharge status: Home / Self Care | Payer: 59 | Source: Ambulatory Visit | Attending: Hematology | Admitting: Hematology

## 2022-12-02 ENCOUNTER — Inpatient Hospital Stay: Payer: 59 | Attending: Hematology

## 2022-12-02 DIAGNOSIS — Z87891 Personal history of nicotine dependence: Secondary | ICD-10-CM | POA: Insufficient documentation

## 2022-12-02 DIAGNOSIS — C186 Malignant neoplasm of descending colon: Secondary | ICD-10-CM | POA: Insufficient documentation

## 2022-12-02 DIAGNOSIS — Z85038 Personal history of other malignant neoplasm of large intestine: Secondary | ICD-10-CM | POA: Diagnosis present

## 2022-12-02 LAB — CBC WITH DIFFERENTIAL/PLATELET
Abs Immature Granulocytes: 0.08 10*3/uL — ABNORMAL HIGH (ref 0.00–0.07)
Basophils Absolute: 0.1 10*3/uL (ref 0.0–0.1)
Basophils Relative: 1 %
Eosinophils Absolute: 0.2 10*3/uL (ref 0.0–0.5)
Eosinophils Relative: 2 %
HCT: 38.6 % (ref 36.0–46.0)
Hemoglobin: 13.3 g/dL (ref 12.0–15.0)
Immature Granulocytes: 1 %
Lymphocytes Relative: 20 %
Lymphs Abs: 2.1 10*3/uL (ref 0.7–4.0)
MCH: 28.2 pg (ref 26.0–34.0)
MCHC: 34.5 g/dL (ref 30.0–36.0)
MCV: 81.8 fL (ref 80.0–100.0)
Monocytes Absolute: 0.5 10*3/uL (ref 0.1–1.0)
Monocytes Relative: 5 %
Neutro Abs: 7.5 10*3/uL (ref 1.7–7.7)
Neutrophils Relative %: 71 %
Platelets: 244 10*3/uL (ref 150–400)
RBC: 4.72 MIL/uL (ref 3.87–5.11)
RDW: 14.3 % (ref 11.5–15.5)
WBC: 10.3 10*3/uL (ref 4.0–10.5)
nRBC: 0 % (ref 0.0–0.2)

## 2022-12-02 LAB — COMPREHENSIVE METABOLIC PANEL
ALT: 32 U/L (ref 0–44)
AST: 21 U/L (ref 15–41)
Albumin: 4.1 g/dL (ref 3.5–5.0)
Alkaline Phosphatase: 80 U/L (ref 38–126)
Anion gap: 8 (ref 5–15)
BUN: 12 mg/dL (ref 6–20)
CO2: 24 mmol/L (ref 22–32)
Calcium: 8.7 mg/dL — ABNORMAL LOW (ref 8.9–10.3)
Chloride: 101 mmol/L (ref 98–111)
Creatinine, Ser: 0.72 mg/dL (ref 0.44–1.00)
GFR, Estimated: >60 mL/min (ref >60)
Glucose, Bld: 127 mg/dL — ABNORMAL HIGH (ref 70–99)
Potassium: 3.6 mmol/L (ref 3.5–5.1)
Sodium: 133 mmol/L — ABNORMAL LOW (ref 135–145)
Total Bilirubin: 1.2 mg/dL — ABNORMAL HIGH (ref <1.2)
Total Protein: 7.3 g/dL (ref 6.5–8.1)

## 2022-12-02 MED ORDER — IOHEXOL 300 MG/ML  SOLN
100.0000 mL | Freq: Once | INTRAMUSCULAR | Status: AC | PRN
  Administered 2022-12-02: 85 mL via INTRAVENOUS

## 2022-12-02 MED ORDER — HEPARIN SOD (PORK) LOCK FLUSH 100 UNIT/ML IV SOLN: 500.0000 [IU] | Freq: Once | INTRAVENOUS | Status: DC

## 2022-12-02 MED ORDER — SODIUM CHLORIDE 0.9% FLUSH
10.0000 mL | Freq: Once | INTRAVENOUS | Status: AC
  Administered 2022-12-02: 10 mL via INTRAVENOUS

## 2022-12-02 MED ORDER — HEPARIN SOD (PORK) LOCK FLUSH 100 UNIT/ML IV SOLN
500.0000 [IU] | Freq: Once | INTRAVENOUS | Status: AC
  Administered 2022-12-02: 500 [IU] via INTRAVENOUS

## 2022-12-02 NOTE — Progress Notes (Signed)
Patients port flushed without difficulty.  Good blood return noted with no bruising or swelling noted at site.  Band aid applied.  VSS with discharge and left in satisfactory condition with no s/s of distress noted.

## 2022-12-03 LAB — CEA: CEA: 2 ng/mL (ref 0.0–4.7)

## 2022-12-09 ENCOUNTER — Inpatient Hospital Stay: Payer: 59 | Attending: Hematology | Admitting: Hematology

## 2022-12-09 VITALS — BP 181/123 | HR 92 | Temp 98.2°F | Resp 18 | Wt 238.0 lb

## 2022-12-09 DIAGNOSIS — K760 Fatty (change of) liver, not elsewhere classified: Secondary | ICD-10-CM | POA: Insufficient documentation

## 2022-12-09 DIAGNOSIS — Z85038 Personal history of other malignant neoplasm of large intestine: Secondary | ICD-10-CM | POA: Insufficient documentation

## 2022-12-09 DIAGNOSIS — C186 Malignant neoplasm of descending colon: Secondary | ICD-10-CM

## 2022-12-09 DIAGNOSIS — Z87891 Personal history of nicotine dependence: Secondary | ICD-10-CM | POA: Insufficient documentation

## 2022-12-09 NOTE — Progress Notes (Signed)
[facility] [address] [place] [address]    Clinic Day:  12/09/2022  Referring physician: Junie Spencer, FNP  Patient Care Team: Junie Spencer, FNP as PCP - General (Family Medicine) Alison Massed, MD as Medical Oncologist (Medical Oncology) Therese Sarah, RN as Oncology Nurse Navigator (Medical Oncology)   ASSESSMENT & PLAN:   Assessment: 1. Stage IIIb (T3N1B) descending colon cancer: - Presentation with abdominal pain for few weeks to the ER.  History of IBS and had colonoscopy more than 10 years ago.  No weight loss. - CT CAP with contrast (04/12/2021): Obstructive 5 cm distal descending colon neoplasm with associated proximal stercoral colitis with no bowel perforation.  2 indeterminate right upper lobe lung nodules 1 calcified and 1 noncalcified measuring up to 5 mm.  2 approximately 5 mm hyperdense foci along the gallbladder lumen which may represent gallbladder polyp or stone. - Sigmoidoscopy (04/13/2021) fungating infiltrative obstructing large mass found in the descending colon. - Left hemicolectomy (04/15/2021): Moderately differentiated adenocarcinoma, grade 2, 2/35 lymph nodes positive, margins negative, PT3PN1B.  MMR preserved. - Preoperative CEA (04/12/2021): 2.5. - Adjuvant chemotherapy with 3 months of CapeOx was recommended with close monitoring of right lung nodules on subsequent scans. - 4 cycles of CapeOx from 05/20/2021 through 07/22/2021 - Germline mutation testing: Negative   2. Social/family history: - She lives at home with her husband.  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  No chemical exposure.  Quit smoking at age 66. - Maternal aunt had brain tumor.  Mother had breast cancer.  Maternal aunt died of colon cancer in her 21s.  Maternal first cousin died of colon cancer in his 30s.  Maternal uncle had stomach cancer.  Paternal aunt had breast cancer.  Maternal grandfather had lung cancer.    Plan: 1. Stage IIIb (T3N1B)  descending colon cancer, MMR stable: - No change in bowel habits.  No bleeding per rectum or melena. - Reviewed labs from 12/02/2022: Normal LFTs.  CBC was grossly normal.  CEA was 2.0. - CTAP on 12/02/2022: No evidence of recurrence or metastatic disease.  Hepatic steatosis present. - She is having discomfort at the port site.  She may have her port discontinued by contacting Dr. Lovell Sheehan office. - RTC 3 months for follow-up with repeat CEA and Signatera.  2.  Diarrhea: - Continue fiber supplements daily to aid normal stool consistency.  3.  Hypertension: - She had a reaction to lisinopril with lip swelling.  She stopped taking amlodipine also last week.  Blood pressure today is high.  Recommend restarting amlodipine 10 mg daily.  As per patient's request, we will make a referral to [place] primary care.    Orders Placed This Encounter  Procedures   CBC with Differential    Standing Status:   Future    Standing Expiration Date:   12/09/2023   Comprehensive metabolic panel    Standing Status:   Future    Standing Expiration Date:   12/09/2023   CEA    Standing Status:   Future    Standing Expiration Date:   12/09/2023   Signatera    Select as applicable. If patient is on or planning to receive immunotherapy, select drug: Not on Immunotherapy If "Other or Multiple", Write down drug name:  Do not Delete Below This Line   ==========Department Information========== ID: [id number] Department:[facility] [place] - A DEPT OF [place] [address] [place] [address] Dept: [phone] Dept Fax: [phone]    Standing Status:   Future    Standing Expiration Date:   12/09/2023    Order Specific Question:   [name] to follow up with patient for sample collection (mobile phleb, lab, or saliva):    Answer:   No    Order Specific Question:   Surveillance Program draw frequency:    Answer:   Every 3 Months    Order  Specific Question:   Surveillance Program draw count:    Answer:   4    Order Specific Question:   Cancer type:    Answer:   Colon    Order Specific Question:   Stage of diagnosis:    Answer:   III    Order Specific Question:   History of recurrence?    Answer:   No    Order Specific Question:   Current disease status:    Answer:   No evidence of disease    Order Specific Question:   Is the patient receiving or planning to receive immunotherapy?    Answer:   No    Order Specific Question:   Patient status:    Answer:   Outpatient    Order Specific Question:   By placing this electronic order I confirm the testing ordered herein is medically necessary and this patient has been informed of the details of the genetic test(s) ordered, including the risks, benefits, and alternatives, and has consented to testing.    Answer:   Yes    Order Specific Question:   What type of billing?    Answer:   Agricultural engineer as a Neurosurgeon for Alison Massed, MD.,have documented all relevant documentation on the behalf of Alison Massed, MD,as directed by  Alison Massed, MD while in the presence of Alison Massed, MD.  I, Alison Massed MD, have reviewed the above documentation for accuracy and completeness, and I agree with the above.     Alison Massed, MD   12/3/20242:25 PM  CHIEF COMPLAINT:   Diagnosis: descending colon cancer    Cancer Staging  Colon cancer [facility]) Staging form: Colon and Rectum, AJCC 8th Edition - Clinical stage from 05/09/2021: Stage IIIB (cT3, cN1b, cM0) - Unsigned    Prior Therapy: 1. Left hemicolectomy (04/15/2021) 2.  4 cycles of CapeOx completed on 07/22/2021  Current Therapy:  surveillance   HISTORY OF PRESENT ILLNESS:   Oncology History  Colon cancer (HCC)  05/09/2021 Initial Diagnosis   Colon cancer (HCC)   05/20/2021 - 07/22/2021 Chemotherapy   Patient is on Treatment Plan : COLORECTAL Xelox (Capeox)  q21d     07/17/2022 Genetic Testing   Negative genetic testing on the Multi-cancer + RNA panel.  The report date is July 17, 2022.  The Multi-Cancer + RNA Panel offered by [facility] includes sequencing and/or deletion/duplication analysis of the following 70 genes:  AIP*, ALK, APC*, ATM*, AXIN2*, BAP1*, BARD1*, BLM*, BMPR1A*, BRCA1*, BRCA2*, BRIP1*, CDC73*, CDH1*, CDK4, CDKN1B*, CDKN2A, CHEK2*, CTNNA1*, DICER1*, EPCAM (del/dup only), EGFR, FH*, FLCN*, GREM1 (promoter dup only), HOXB13, KIT, LZTR1, MAX*, MBD4, MEN1*, MET, MITF, MLH1*, MSH2*, MSH3*, MSH6*, MUTYH*, NF1*, NF2*, NTHL1*, PALB2*, PDGFRA, PMS2*, POLD1*, POLE*, POT1*, PRKAR1A*, PTCH1*, PTEN*, RAD51C*, RAD51D*, RB1*, RET, SDHA* (sequencing only), SDHAF2*, SDHB*, SDHC*, SDHD*, SMAD4*, SMARCA4*, SMARCB1*, SMARCE1*, STK11*, SUFU*, TMEM127*, TP53*, TSC1*, TSC2*, VHL*. RNA analysis is performed for * genes.      INTERVAL HISTORY:   Leeona  is a 52 y.o. female presenting to clinic today for follow up of descending colon cancer. She was last seen by me on 09/02/22.  Since her last visit, she underwent CT A/P on 12/02/22 that found: no acute findings; no evidence of recurrent or metastatic disease; moderate hepatic steatosis; tiny left renal calculus; no evidence of ureteral calculi or hydronephrosis.  Of note, she presented to the ED at [facility] in [place], [place] on 11/26/22 for angioedema, likely due to taking lisinopril. She was given epinephrine and Benadryl and symptoms improved. She reports she also has a history of an allergic reaction to hydrochlorothiazide.   Today, she states that she is doing well overall. Her appetite level is at 100%. Her energy level is at 100%.   She has not been taking Norvasc since hospitalization. She reports normal BM's and denies any BRBPR or melena. She denies any history of diverticulosis, though she does note she has irritable bowel syndrome.   She reports pain and her skin breaking out around her port  area. She would like to have her port removed.   PAST MEDICAL HISTORY:   Past Medical History: Past Medical History:  Diagnosis Date   Cancer (HCC)    Colon cancer (HCC)    Family history of breast cancer    Family history of colon cancer    Family history of stomach cancer    History of kidney stones    Hypertension    Lyme disease 2018    Surgical History: Past Surgical History:  Procedure Laterality Date   ABDOMINAL HYSTERECTOMY  2013   APPENDECTOMY     BIOPSY  04/13/2021   Procedure: BIOPSY;  Surgeon: Lanelle Bal, DO;  [facility];  Service: Endoscopy;;  descending colon mass   BIOPSY  01/24/2022   Procedure: BIOPSY;  Surgeon: Lanelle Bal, DO;  [facility];  Service: Endoscopy;;   COLONOSCOPY WITH PROPOFOL N/A 01/24/2022   Procedure: COLONOSCOPY WITH PROPOFOL;  Surgeon: Lanelle Bal, DO;  [facility];  Service: Endoscopy;  Laterality: N/A;  1:00 pm   COLONOSCOPY WITH PROPOFOL N/A 08/15/2022   Procedure: COLONOSCOPY WITH PROPOFOL;  Surgeon: Lanelle Bal, DO;  [facility];  Service: Endoscopy;  Laterality: N/A;  11:30AM;ASA 2   FLEXIBLE SIGMOIDOSCOPY N/A 04/13/2021   Procedure: FLEXIBLE SIGMOIDOSCOPY;  Surgeon: Lanelle Bal, DO;  [facility];  Service: Endoscopy;  Laterality: N/A;   HEMOSTASIS CLIP PLACEMENT  01/24/2022   Procedure: HEMOSTASIS CLIP PLACEMENT;  Surgeon: Lanelle Bal, DO;  [facility];  Service: Endoscopy;;   PARTIAL COLECTOMY N/A 04/15/2021   Procedure: EXTENDED RIGHT HEMICOLECTOMY;  Surgeon: [name], MD;  Location: [facility];  Service: General;  Laterality: N/A;   POLYPECTOMY  01/24/2022   Procedure: POLYPECTOMY;  Surgeon: Lanelle Bal, DO;  [facility];  Service: Endoscopy;;   POLYPECTOMY  08/15/2022   Procedure: POLYPECTOMY;  Surgeon: Lanelle Bal, DO;  [facility];  Service: Endoscopy;;   PORTACATH PLACEMENT Left  05/13/2021   Procedure: INSERTION PORT-A-CATH;  Surgeon: [name], MD;  Location: [facility];  Service: General;  Laterality: Left;   SUBMUCOSAL LIFTING INJECTION  01/24/2022   Procedure: SUBMUCOSAL LIFTING INJECTION;  Surgeon: Lanelle Bal, DO;  [facility];  Service: Endoscopy;;   WISDOM TOOTH EXTRACTION      Social History: Social History   Socioeconomic History   Marital status: Married  Spouse name: Not on file   Number of children: Not on file   Years of education: Not on file   Highest education level: Not on file  Occupational History   Not on file  Tobacco Use   Smoking status: Former    Types: Cigarettes    Passive exposure: Past   Smokeless tobacco: Never  Vaping Use   Vaping status: Never Used  Substance and Sexual Activity   Alcohol use: Yes    Comment: occasionally   Drug use: Yes    Types: Marijuana   Sexual activity: Yes  Other Topics Concern   Not on file  Social History Narrative   Not on file   Social Determinants of Health   Financial Resource Strain: Not on file  Food Insecurity: Not on file  Transportation Needs: Not on file  Physical Activity: Not on file  Stress: Not on file  Social Connections: Not on file  Intimate Partner Violence: Not on file    Family History: Family History  Problem Relation Age of Onset   Kidney disease Mother    Hypertension Mother    Breast cancer Mother 49 - 78   Stroke Father    Colon cancer Maternal Aunt 49       d. 70   Other Maternal Aunt 42       pituitary cancer   Breast cancer Maternal Aunt 31   Stomach cancer Maternal Uncle 55   Breast cancer Paternal Aunt    Lung cancer Maternal Grandfather    Colon cancer Cousin        d. 30s; maternal first cousin    Current Medications:  Current Outpatient Medications:    amLODipine (NORVASC) 10 MG tablet, Take 1 tablet (10 mg total) by mouth daily., Disp: 90 tablet, Rfl: 1   cetirizine (ZYRTEC) 10 MG tablet, Take 10 mg by mouth at  bedtime., Disp: , Rfl:    Cyanocobalamin (VITAMIN B-12) 5000 MCG TBDP, Take 5,000 mcg by mouth at bedtime., Disp: , Rfl:    ferrous sulfate 325 (65 FE) MG tablet, Take 325 mg by mouth every evening., Disp: , Rfl:    FIBER PO, Take 5 g by mouth daily., Disp: , Rfl:    fluconazole (DIFLUCAN) 150 MG tablet, Take 1 tablet (150 mg total) by mouth every 3 (three) days as needed., Disp: 2 tablet, Rfl: 0   Lactobacillus (ACIDOPHILUS PO), Take 1 capsule by mouth at bedtime., Disp: , Rfl:    loratadine (CLARITIN) 10 MG tablet, Take 10 mg by mouth at bedtime., Disp: , Rfl:    MAGNESIUM PO, Take 400 mg by mouth every evening., Disp: , Rfl:    Multiple Vitamins-Minerals (ZINC PO), Take 1 tablet by mouth at bedtime. One A Day, Disp: , Rfl:    spironolactone (ALDACTONE) 25 MG tablet, Take 0.5 tablets (12.5 mg total) by mouth daily., Disp: 90 tablet, Rfl: 3   VITAMIN D PO, Take 1,000 Units by mouth at bedtime., Disp: , Rfl:    Allergies: Allergies  Allergen Reactions   Aleve [Naproxen] Hives    Ok with ibuprofen   Hydrochlorothiazide Hives   Lisinopril Hives, Itching and Swelling   Codeine Hives, Nausea And Vomiting and Rash   Latex Rash    REVIEW OF SYSTEMS:   Review of Systems  Constitutional:  Negative for chills, fatigue and fever.  HENT:   Negative for lump/mass, mouth sores, nosebleeds, sore throat and trouble swallowing.   Eyes:  Negative for eye problems.  Respiratory:  Negative for cough and shortness of breath.   Cardiovascular:  Negative for chest pain, leg swelling and palpitations.  Gastrointestinal:  Negative for abdominal pain, constipation, diarrhea, nausea and vomiting.  Genitourinary:  Negative for bladder incontinence, difficulty urinating, dysuria, frequency, hematuria and nocturia.   Musculoskeletal:  Negative for arthralgias, back pain, flank pain, myalgias and neck pain.  Skin:  Negative for itching and rash.  Neurological:  Negative for dizziness, headaches and numbness.   Hematological:  Does not bruise/bleed easily.  Psychiatric/Behavioral:  Negative for depression, sleep disturbance and suicidal ideas. The patient is not nervous/anxious.   All other systems reviewed and are negative.    VITALS:   Blood pressure (!) 181/123, pulse 92, temperature 98.2 F (36.8 C), temperature source Tympanic, resp. rate 18, weight 238 lb (108 kg), SpO2 100%.  Wt Readings from Last 3 Encounters:  12/09/22 238 lb (108 kg)  09/23/22 234 lb 9.6 oz (106.4 kg)  09/05/22 235 lb 9.6 oz (106.9 kg)    Body mass index is 39.61 kg/m.  Performance status (ECOG): 0 - Asymptomatic  PHYSICAL EXAM:   Physical Exam Vitals and nursing note reviewed. Exam conducted with a chaperone present.  Constitutional:      Appearance: Normal appearance.  Cardiovascular:     Rate and Rhythm: Normal rate and regular rhythm.     Pulses: Normal pulses.     Heart sounds: Normal heart sounds.  Pulmonary:     Effort: Pulmonary effort is normal.     Breath sounds: Normal breath sounds.  Abdominal:     Palpations: Abdomen is soft. There is no hepatomegaly, splenomegaly or mass.     Tenderness: There is abdominal tenderness in the left lower quadrant.  Musculoskeletal:     Right lower leg: No edema.     Left lower leg: No edema.  Lymphadenopathy:     Cervical: No cervical adenopathy.     Right cervical: No superficial, deep or posterior cervical adenopathy.    Left cervical: No superficial, deep or posterior cervical adenopathy.     Upper Body:     Right upper body: No supraclavicular or axillary adenopathy.     Left upper body: No supraclavicular or axillary adenopathy.  Neurological:     General: No focal deficit present.     Mental Status: She is alert and oriented to person, place, and time.  Psychiatric:        Mood and Affect: Mood normal.        Behavior: Behavior normal.     LABS:      Latest Ref Rng & Units 12/02/2022    1:50 PM 08/26/2022    2:18 PM 05/01/2022    2:00 PM   CBC  WBC 4.0 - 10.5 K/uL 10.3  9.3  7.3   Hemoglobin 12.0 - 15.0 g/dL 16.1  09.6  04.5   Hematocrit 36.0 - 46.0 % 38.6  39.7  36.1   Platelets 150 - 400 K/uL 244  243  213       Latest Ref Rng & Units 12/02/2022    1:50 PM 09/05/2022   12:44 PM 08/26/2022    2:18 PM  CMP  Glucose 70 - 99 mg/dL 409  811  914   BUN 6 - 20 mg/dL 12  13  12    Creatinine 0.44 - 1.00 mg/dL 7.82  9.56  2.13   Sodium 135 - 145 mmol/L 133  138  136   Potassium 3.5 - 5.1 mmol/L 3.6  4.5  4.0   Chloride 98 - 111 mmol/L 101  98  98   CO2 22 - 32 mmol/L 24  24  29    Calcium 8.9 - 10.3 mg/dL 8.7  9.9  9.5   Total Protein 6.5 - 8.1 g/dL 7.3   7.7   Total Bilirubin <1.2 mg/dL 1.2   1.0   Alkaline Phos 38 - 126 U/L 80   89   AST 15 - 41 U/L 21   23   ALT 0 - 44 U/L 32   35      Lab Results  Component Value Date   CEA1 2.0 12/02/2022   /  CEA  Date Value Ref Range Status  12/02/2022 2.0 0.0 - 4.7 ng/mL Final    Comment:    (NOTE)                             Nonsmokers          <3.9                             Smokers             <5.6 Roche [employer] Electrochemiluminescence Immunoassay (ECLIA) Values obtained with different assay methods or kits cannot be used interchangeably.  Results cannot be interpreted as absolute evidence of the presence or absence of malignant disease. Performed At: [facility] [address] [place], [place] [id number] Jolene Schimke MD HQ:[id number]    No results found for: "PSA1" No results found for: "CAN199" Lab Results  Component Value Date   CAN125 10.2 04/12/2021    No results found for: "TOTALPROTELP", "ALBUMINELP", "A1GS", "A2GS", "BETS", "BETA2SER", "GAMS", "MSPIKE", "SPEI" Lab Results  Component Value Date   TIBC 395 08/26/2022   TIBC 358 10/22/2021   TIBC 464 (H) 07/22/2021   FERRITIN 52 08/26/2022   FERRITIN 47 10/22/2021   FERRITIN 24 07/22/2021   IRONPCTSAT 19 08/26/2022   IRONPCTSAT 19 10/22/2021   IRONPCTSAT 15 07/22/2021   No  results found for: "LDH"   STUDIES:   CT ABDOMEN PELVIS W CONTRAST  Result Date: 12/04/2022 CLINICAL DATA:  Follow-up colon carcinoma. Previous surgery and chemotherapy. Surveillance. * Tracking Code: BO * EXAM: CT ABDOMEN AND PELVIS WITH CONTRAST TECHNIQUE: Multidetector CT imaging of the abdomen and pelvis was performed using the standard protocol following bolus administration of intravenous contrast. RADIATION DOSE REDUCTION: This exam was performed according to the departmental dose-optimization program which includes automated exposure control, adjustment of the mA and/or kV according to patient size and/or use of iterative reconstruction technique. CONTRAST:  85mL OMNIPAQUE IOHEXOL 300 MG/ML  SOLN COMPARISON:  04/11/2022 FINDINGS: Lower Chest: No acute findings. Hepatobiliary: No suspicious hepatic masses identified. Moderate diffuse hepatic steatosis noted. Gallbladder is unremarkable. No evidence of biliary ductal dilatation. Pancreas:  No mass or inflammatory changes. Spleen: Within normal limits in size and appearance. Adrenals/Urinary Tract: Stable tiny benign left adrenal adenoma (No followup imaging is recommended). No suspicious renal masses identified. 2 mm calculus again seen in lower pole of left kidney. No evidence of ureteral calculi or hydronephrosis. Stomach/Bowel: Prior subtotal colectomy again noted. No mass identified. No No evidence of bowel obstruction, inflammatory process, or abnormal fluid collections. Vascular/Lymphatic: No pathologically enlarged lymph nodes. No acute vascular findings. Reproductive: Prior hysterectomy noted. Adnexal regions are unremarkable in appearance. Other:  None. Musculoskeletal:  No suspicious bone lesions identified. IMPRESSION:  No acute findings.  No evidence of recurrent or metastatic disease. Moderate hepatic steatosis. Tiny left renal calculus. No evidence of ureteral calculi or hydronephrosis. Electronically Signed   By: Danae Orleans M.D.   On:  12/04/2022 11:43

## 2022-12-09 NOTE — Patient Instructions (Addendum)
[facility] at [facility] Discharge Instructions   You were seen and examined today by Dr. Ellin Saba.  He reviewed the results of your lab work which are normal/stable. Your CEA (tumor marker) is normal.   He reviewed the results of your CT scan. There is no evidence of cancer seen on this exam.   We will refer you to someone to establish primary care and manage your blood pressure.   We will see you back in 3 months. We will repeat lab work prior to this visit.   Return as scheduled.    Thank you for choosing [facility] at [facility] to provide your oncology and hematology care.  To afford each patient quality time with our provider, please arrive at least 15 minutes before your scheduled appointment time.   If you have a lab appointment with the Cancer Center please come in thru the Main Entrance and check in at the main information desk.  You need to re-schedule your appointment should you arrive 10 or more minutes late.  We strive to give you quality time with our providers, and arriving late affects you and other patients whose appointments are after yours.  Also, if you no show three or more times for appointments you may be dismissed from the clinic at the providers discretion.     Again, thank you for choosing [facility].  Our hope is that these requests will decrease the amount of time that you wait before being seen by our physicians.       _____________________________________________________________  Should you have questions after your visit to [facility], please contact our office at [phone] and follow the prompts.  Our office hours are 8:00 a.m. and 4:30 p.m. Monday - Friday.  Please note that voicemails left after 4:00 p.m. may not be returned until the following business day.  We are closed weekends and major holidays.  You do have access to a nurse 24-7, just call the main number to the  clinic [phone] and do not press any options, hold on the line and a nurse will answer the phone.    For prescription refill requests, have your pharmacy contact our office and allow 72 hours.    Due to Covid, you will need to wear a mask upon entering the hospital. If you do not have a mask, a mask will be given to you at the Main Entrance upon arrival. For doctor visits, patients may have 1 support person age 18 or older with them. For treatment visits, patients can not have anyone with them due to social distancing guidelines and our immunocompromised population.

## 2022-12-11 ENCOUNTER — Telehealth: Payer: Self-pay | Admitting: Family

## 2022-12-11 ENCOUNTER — Other Ambulatory Visit: Payer: Self-pay | Admitting: Nurse Practitioner

## 2022-12-11 DIAGNOSIS — I10 Essential (primary) hypertension: Secondary | ICD-10-CM

## 2022-12-11 MED ORDER — CLONIDINE HCL 0.1 MG PO TABS: 0.1000 mg | ORAL_TABLET | Freq: Once | ORAL | 0 refills | Status: DC

## 2022-12-11 NOTE — Telephone Encounter (Signed)
Patient transferred to nurse triage station. B/P reading 190/120. Patient advised to go to ED.  Patient was reluctant.  She opted for an appointment.  I made appointment with DOD on Monday December 9th.  I reiterated the importance of getting blood pressure under control. I advised that if she stars to take a turn for the worse to contact EMS immediately.

## 2022-12-11 NOTE — Progress Notes (Signed)
Patient transferred to nurse triage station. B/P reading 190/120. Patient advised to go to ED.  Patient was reluctant.  She opted for an appointment.  I made appointment with DOD on Monday December 9th.  I reiterated the importance of getting blood pressure under control. I advised that if she stars to take a turn for the worse to contact EMS immediately.     Will send Clonidine 0.1 mg PRN for BP 150/90 or higher while she is waiting to be seen on Monday I agree if she she becomes symptomatic needs to goes to ED

## 2022-12-12 NOTE — Telephone Encounter (Signed)
Patient aware.

## 2022-12-15 ENCOUNTER — Ambulatory Visit: Payer: 59 | Admitting: Nurse Practitioner

## 2022-12-15 ENCOUNTER — Encounter: Payer: Self-pay | Admitting: Nurse Practitioner

## 2022-12-15 VITALS — BP 162/96 | HR 98 | Temp 97.0°F | Ht 65.0 in | Wt 236.6 lb

## 2022-12-15 DIAGNOSIS — I10 Essential (primary) hypertension: Secondary | ICD-10-CM | POA: Diagnosis not present

## 2022-12-15 MED ORDER — AMLODIPINE BESYLATE 5 MG PO TABS: 5.0000 mg | ORAL_TABLET | Freq: Every day | ORAL | 0 refills | Status: DC

## 2022-12-15 MED ORDER — METOPROLOL TARTRATE 25 MG PO TABS
12.5000 mg | ORAL_TABLET | Freq: Two times a day (BID) | ORAL | 3 refills | Status: DC
Start: 2022-12-15 — End: 2022-12-23

## 2022-12-15 NOTE — Progress Notes (Signed)
Acute Office Visit  Subjective:     Patient ID: Alison Ramsey, female    DOB: 08/26/1970, 52 y.o.   MRN: [id number]  Chief Complaint  Patient presents with   Hypertension    Having issues with high blood pressure     HPI Alison Ramsey 52 year old female present with her husband December 15, 2022 for an acute visit for elevated BP. PMH  HTN, iron deficiency anemia, colon cancer Hypertension, follow-up  BP Readings from Last 3 Encounters:  12/15/22 (!) 162/96  12/09/22 (!) 181/123  09/23/22 (!) 146/93   Wt Readings from Last 3 Encounters:  12/15/22 236 lb 9.6 oz (107.3 kg)  12/09/22 238 lb (108 kg)  09/23/22 234 lb 9.6 oz (106.4 kg)     She was last seen for hypertension 3 months ago.  BP at that visit was 146/93 and Lisinopril was increased to 40 mg  and spironolactone was started.5 mg daily and Amlodipine 10 mg was continue.  Management since that visit includes Lisinopril and spironolactone. In Nov she develop angioedema from Lisinopril. It was d/c  and client was instructed to f/u with PCP for HTN.  She reports good compliance with treatment. She is having side effects. With Amlodipine BLE edema She is following a Regular diet. She is exercising. She does smoke. Use of agents associated with hypertension: none.  Outside blood pressures are 190/101. Symptoms: No chest pain No chest pressure  No palpitations No syncope  No dyspnea No orthopnea  No paroxysmal nocturnal dyspnea No lower extremity edema   Pertinent labs Lab Results  Component Value Date   CHOL 167 04/25/2022   HDL 47 04/25/2022   LDLCALC 91 04/25/2022   TRIG 169 (H) 04/25/2022   CHOLHDL 3.6 04/25/2022   Lab Results  Component Value Date   NA 133 (L) 12/02/2022   K 3.6 12/02/2022   CREATININE 0.72 12/02/2022   GFRNONAA >60 12/02/2022   GLUCOSE 127 (H) 12/02/2022   TSH 2.340 04/25/2022     The 10-year ASCVD risk score (Arnett DK, et al., 2019) is: 3.1% On Friday the triage nurse visit  advised client to go to the ED however she refused saying wanted to be schedule for visit today.  Clonidine 0.1 mg was sent for her to her pharmacy and instructed to take for BP of greater than 19*80/90, she reports that she did not pick up the medication because her husband did not want her to take it.  As such we will DC clonidine today. ROS Negative unless indicated in HPI    Objective:    BP (!) 162/96   Pulse 98   Temp (!) 97 F (36.1 C) (Temporal)   Ht 5\' 5"  (1.651 m)   Wt 236 lb 9.6 oz (107.3 kg)   SpO2 100%   BMI 39.37 kg/m  BP Readings from Last 3 Encounters:  12/15/22 (!) 162/96  12/09/22 (!) 181/123  09/23/22 (!) 146/93   Wt Readings from Last 3 Encounters:  12/15/22 236 lb 9.6 oz (107.3 kg)  12/09/22 238 lb (108 kg)  09/23/22 234 lb 9.6 oz (106.4 kg)      Physical Exam Vitals and nursing note reviewed.  Constitutional:      Appearance: She is obese.  HENT:     Head: Normocephalic and atraumatic.  Eyes:     General: No scleral icterus.    Extraocular Movements: Extraocular movements intact.     Conjunctiva/sclera: Conjunctivae normal.     Pupils: Pupils are  equal, round, and reactive to light.  Neck:     Vascular: No carotid bruit.  Cardiovascular:     Rate and Rhythm: Normal rate and regular rhythm.  Pulmonary:     Effort: Pulmonary effort is normal.     Breath sounds: Normal breath sounds.  Musculoskeletal:     Cervical back: Normal range of motion and neck supple. No rigidity or tenderness.  Lymphadenopathy:     Cervical: No cervical adenopathy.  Skin:    General: Skin is warm and dry.     Findings: No rash.  Neurological:     Mental Status: She is alert and oriented to person, place, and time. Mental status is at baseline.  Psychiatric:        Mood and Affect: Mood normal.        Behavior: Behavior normal.        Thought Content: Thought content normal.        Judgment: Judgment normal.     No results found for any visits on 12/15/22.       Assessment & Plan:  Primary hypertension -     amLODIPine Besylate; Take 1 tablet (5 mg total) by mouth daily.  Dispense: 90 tablet; Refill: 0  Other orders -     Metoprolol Tartrate; Take 0.5 tablets (12.5 mg total) by mouth 2 (two) times daily.  Dispense: 180 tablet; Refill: 3   Alyssamae results 52 year old Caucasian female seen yesterday for elevated blood pressure, no acute distress  HTN not well-controlled with current medication.  Will decrease amlodipine to 5 mg daily due to lower extremity edema; DC spironolactone and started client on metoprolol 25 mg twice daily.  She will follow-up in 1 week for nurse visit for BP check.  Client also provided a BP log instructed to check BP twice a day and to bring it to that follow-up appointment. DC clonidine since client never picked up the prescription that was sent for her on Friday  Future client is interested in weight loss management medications, however she is still under the care of oncology for colon cancer so she wants to wait until her Access Port is removed and discharged from oncology.  encourage healthy lifestyle choices, including diet (rich in fruits, vegetables, and lean proteins, and low in salt and simple carbohydrates) and exercise (at least 30 minutes of moderate physical activity daily).     The above assessment and management plan was discussed with the patient. The patient verbalized understanding of and has agreed to the management plan. Patient is aware to call the clinic if they develop any new symptoms or if symptoms persist or worsen. Patient is aware when to return to the clinic for a follow-up visit. Patient educated on when it is appropriate to go to the emergency department.  Return in about 6 weeks (around 01/26/2023) for follow-up HTN.  Arrie Aran Santa Lighter, [facility] [address] [place], [place] [address] [phone]  Note: This document was prepared by [employer] voice  dictation technology and any errors that results from this process are unintentional.

## 2022-12-22 ENCOUNTER — Ambulatory Visit: Payer: 59

## 2022-12-23 ENCOUNTER — Other Ambulatory Visit: Payer: Self-pay | Admitting: Nurse Practitioner

## 2022-12-23 ENCOUNTER — Ambulatory Visit: Payer: 59

## 2022-12-23 VITALS — BP 162/105 | HR 63

## 2022-12-23 DIAGNOSIS — Z0130 Encounter for examination of blood pressure without abnormal findings: Secondary | ICD-10-CM

## 2022-12-23 DIAGNOSIS — I10 Essential (primary) hypertension: Secondary | ICD-10-CM

## 2022-12-23 MED ORDER — METOPROLOL TARTRATE 25 MG PO TABS: 25.0000 mg | ORAL_TABLET | Freq: Two times a day (BID) | ORAL | 0 refills | Status: DC

## 2022-12-23 NOTE — Progress Notes (Signed)
Patient is in office today for a nurse visit for Blood Pressure Check. Patient blood pressure was 162/105, Patient No chest pain, No shortness of breath, No dyspnea on exertion, No orthopnea, No paroxysmal nocturnal dyspnea, No edema, No palpitations, No syncope.

## 2022-12-25 ENCOUNTER — Ambulatory Visit: Payer: 59 | Admitting: General Surgery

## 2022-12-25 ENCOUNTER — Encounter: Payer: Self-pay | Admitting: General Surgery

## 2022-12-25 VITALS — BP 160/112 | HR 76 | Temp 98.4°F | Resp 18 | Ht 65.0 in | Wt 237.0 lb

## 2022-12-25 DIAGNOSIS — Z85038 Personal history of other malignant neoplasm of large intestine: Secondary | ICD-10-CM

## 2022-12-25 LAB — MOLECULAR PATHOLOGY

## 2022-12-25 NOTE — Progress Notes (Signed)
Alison Ramsey; [id number]; 06/06/1970   HPI Patient is a 52 year old white female who was referred to my care by Dr. Ellin Saba of oncology for Port-A-Cath removal.  She has undergone treatment for her colon cancer and is finished with chemotherapy. Past Medical History:  Diagnosis Date   Cancer [facility])    Colon cancer (HCC)    Family history of breast cancer    Family history of colon cancer    Family history of stomach cancer    History of kidney stones    Hypertension    Lyme disease 2018    Past Surgical History:  Procedure Laterality Date   ABDOMINAL HYSTERECTOMY  2013   APPENDECTOMY     BIOPSY  04/13/2021   Procedure: BIOPSY;  Surgeon: Lanelle Bal, DO;  [facility];  Service: Endoscopy;;  descending colon mass   BIOPSY  01/24/2022   Procedure: BIOPSY;  Surgeon: Lanelle Bal, DO;  [facility];  Service: Endoscopy;;   COLONOSCOPY WITH PROPOFOL N/A 01/24/2022   Procedure: COLONOSCOPY WITH PROPOFOL;  Surgeon: Lanelle Bal, DO;  [facility];  Service: Endoscopy;  Laterality: N/A;  1:00 pm   COLONOSCOPY WITH PROPOFOL N/A 08/15/2022   Procedure: COLONOSCOPY WITH PROPOFOL;  Surgeon: Lanelle Bal, DO;  [facility];  Service: Endoscopy;  Laterality: N/A;  11:30AM;ASA 2   FLEXIBLE SIGMOIDOSCOPY N/A 04/13/2021   Procedure: FLEXIBLE SIGMOIDOSCOPY;  Surgeon: Lanelle Bal, DO;  [facility];  Service: Endoscopy;  Laterality: N/A;   HEMOSTASIS CLIP PLACEMENT  01/24/2022   Procedure: HEMOSTASIS CLIP PLACEMENT;  Surgeon: Lanelle Bal, DO;  [facility];  Service: Endoscopy;;   PARTIAL COLECTOMY N/A 04/15/2021   Procedure: EXTENDED RIGHT HEMICOLECTOMY;  Surgeon: [name], MD;  [facility];  Service: General;  Laterality: N/A;   POLYPECTOMY  01/24/2022   Procedure: POLYPECTOMY;  Surgeon: Lanelle Bal, DO;  [facility];  Service: Endoscopy;;   POLYPECTOMY  08/15/2022    Procedure: POLYPECTOMY;  Surgeon: Lanelle Bal, DO;  [facility];  Service: Endoscopy;;   PORTACATH PLACEMENT Left 05/13/2021   Procedure: INSERTION PORT-A-CATH;  Surgeon: [name], MD;  [facility]: [facility];  Service: General;  Laterality: Left;   SUBMUCOSAL LIFTING INJECTION  01/24/2022   Procedure: SUBMUCOSAL LIFTING INJECTION;  Surgeon: Lanelle Bal, DO;  [facility];  Service: Endoscopy;;   WISDOM TOOTH EXTRACTION      Family History  Problem Relation Age of Onset   Kidney disease Mother    Hypertension Mother    Breast cancer Mother 74 - 67   Stroke Father    Colon cancer Maternal Aunt 49       d. 22   Other Maternal Aunt 6       pituitary cancer   Breast cancer Maternal Aunt 19   Stomach cancer Maternal Uncle 83   Breast cancer Paternal Aunt    Lung cancer Maternal Grandfather    Colon cancer Cousin        d. 30s; maternal first cousin    Current Outpatient Medications on File Prior to Visit  Medication Sig Dispense Refill   amLODipine (NORVASC) 5 MG tablet Take 1 tablet (5 mg total) by mouth daily. 90 tablet 0   fluconazole (DIFLUCAN) 150 MG tablet Take 1 tablet (150 mg total) by mouth every 3 (three) days as needed. (Patient not taking: Reported on 12/25/2022) 2 tablet 0  Lactobacillus (ACIDOPHILUS PO) Take 1 capsule by mouth at bedtime.     MAGNESIUM PO Take 400 mg by mouth every evening.     metoprolol tartrate (LOPRESSOR) 25 MG tablet Take 1 tablet (25 mg total) by mouth 2 (two) times daily. 180 tablet 0   VITAMIN D PO Take 1,000 Units by mouth at bedtime.     [DISCONTINUED] prochlorperazine (COMPAZINE) 10 MG tablet Take 1 tablet (10 mg total) by mouth every 6 (six) hours as needed for nausea or vomiting. 30 tablet 1   No current facility-administered medications on file prior to visit.    Allergies  Allergen Reactions   Aleve [Naproxen] Hives    Ok with ibuprofen   Hydrochlorothiazide Hives   Lisinopril Hives, Itching and  Swelling   Codeine Hives, Nausea And Vomiting and Rash   Latex Rash    Social History   Substance and Sexual Activity  Alcohol Use Yes   Comment: occasionally    Social History   Tobacco Use  Smoking Status Former   Types: Cigarettes   Passive exposure: Past  Smokeless Tobacco Never    Review of Systems  Constitutional: Negative.   HENT: Negative.    Eyes: Negative.   Respiratory: Negative.    Cardiovascular: Negative.   Gastrointestinal: Negative.   Genitourinary: Negative.   Musculoskeletal: Negative.   Skin: Negative.   Neurological: Negative.   Endo/Heme/Allergies: Negative.   Psychiatric/Behavioral: Negative.      Objective   Vitals:   12/25/22 1029  BP: (!) 160/112  Pulse: 76  Resp: 18  Temp: 98.4 F (36.9 C)  SpO2: 96%    Physical Exam Vitals reviewed.  Constitutional:      Appearance: Normal appearance. She is not ill-appearing.  HENT:     Head: Normocephalic and atraumatic.  Cardiovascular:     Rate and Rhythm: Normal rate and regular rhythm.     Heart sounds: Normal heart sounds. No murmur heard.    No friction rub. No gallop.  Pulmonary:     Effort: Pulmonary effort is normal. No respiratory distress.     Breath sounds: Normal breath sounds. No stridor. No wheezing, rhonchi or rales.  Skin:    General: Skin is warm and dry.     Comments: Cath in place left upper chest.  Neurological:     Mental Status: She is alert and oriented to person, place, and time.     Assessment  History of colon insert, finished with chemotherapy Plan  Patient is scheduled for Port-A-Cath removal in the minor procedure room on 12/29/2022.  The risks and benefits of the procedure were fully explained to the patient, who gave informed consent.

## 2022-12-25 NOTE — H&P (Signed)
Alison Ramsey; [id number]; 06/06/1970   HPI Patient is a 52 year old white female who was referred to my care by Dr. Ellin Saba of oncology for Port-A-Cath removal.  She has undergone treatment for her colon cancer and is finished with chemotherapy. Past Medical History:  Diagnosis Date   Cancer [facility])    Colon cancer (HCC)    Family history of breast cancer    Family history of colon cancer    Family history of stomach cancer    History of kidney stones    Hypertension    Lyme disease 2018    Past Surgical History:  Procedure Laterality Date   ABDOMINAL HYSTERECTOMY  2013   APPENDECTOMY     BIOPSY  04/13/2021   Procedure: BIOPSY;  Surgeon: Lanelle Bal, DO;  [facility];  Service: Endoscopy;;  descending colon mass   BIOPSY  01/24/2022   Procedure: BIOPSY;  Surgeon: Lanelle Bal, DO;  [facility];  Service: Endoscopy;;   COLONOSCOPY WITH PROPOFOL N/A 01/24/2022   Procedure: COLONOSCOPY WITH PROPOFOL;  Surgeon: Lanelle Bal, DO;  [facility];  Service: Endoscopy;  Laterality: N/A;  1:00 pm   COLONOSCOPY WITH PROPOFOL N/A 08/15/2022   Procedure: COLONOSCOPY WITH PROPOFOL;  Surgeon: Lanelle Bal, DO;  [facility];  Service: Endoscopy;  Laterality: N/A;  11:30AM;ASA 2   FLEXIBLE SIGMOIDOSCOPY N/A 04/13/2021   Procedure: FLEXIBLE SIGMOIDOSCOPY;  Surgeon: Lanelle Bal, DO;  [facility];  Service: Endoscopy;  Laterality: N/A;   HEMOSTASIS CLIP PLACEMENT  01/24/2022   Procedure: HEMOSTASIS CLIP PLACEMENT;  Surgeon: Lanelle Bal, DO;  [facility];  Service: Endoscopy;;   PARTIAL COLECTOMY N/A 04/15/2021   Procedure: EXTENDED RIGHT HEMICOLECTOMY;  Surgeon: [name], MD;  [facility];  Service: General;  Laterality: N/A;   POLYPECTOMY  01/24/2022   Procedure: POLYPECTOMY;  Surgeon: Lanelle Bal, DO;  [facility];  Service: Endoscopy;;   POLYPECTOMY  08/15/2022    Procedure: POLYPECTOMY;  Surgeon: Lanelle Bal, DO;  [facility];  Service: Endoscopy;;   PORTACATH PLACEMENT Left 05/13/2021   Procedure: INSERTION PORT-A-CATH;  Surgeon: [name], MD;  [facility];  Service: General;  Laterality: Left;   SUBMUCOSAL LIFTING INJECTION  01/24/2022   Procedure: SUBMUCOSAL LIFTING INJECTION;  Surgeon: Lanelle Bal, DO;  [facility];  Service: Endoscopy;;   WISDOM TOOTH EXTRACTION      Family History  Problem Relation Age of Onset   Kidney disease Mother    Hypertension Mother    Breast cancer Mother 74 - 67   Stroke Father    Colon cancer Maternal Aunt 49       d. 22   Other Maternal Aunt 6       pituitary cancer   Breast cancer Maternal Aunt 19   Stomach cancer Maternal Uncle 83   Breast cancer Paternal Aunt    Lung cancer Maternal Grandfather    Colon cancer Cousin        d. 30s; maternal first cousin    Current Outpatient Medications on File Prior to Visit  Medication Sig Dispense Refill   amLODipine (NORVASC) 5 MG tablet Take 1 tablet (5 mg total) by mouth daily. 90 tablet 0   fluconazole (DIFLUCAN) 150 MG tablet Take 1 tablet (150 mg total) by mouth every 3 (three) days as needed. (Patient not taking: Reported on 12/25/2022) 2 tablet 0  Lactobacillus (ACIDOPHILUS PO) Take 1 capsule by mouth at bedtime.     MAGNESIUM PO Take 400 mg by mouth every evening.     metoprolol tartrate (LOPRESSOR) 25 MG tablet Take 1 tablet (25 mg total) by mouth 2 (two) times daily. 180 tablet 0   VITAMIN D PO Take 1,000 Units by mouth at bedtime.     [DISCONTINUED] prochlorperazine (COMPAZINE) 10 MG tablet Take 1 tablet (10 mg total) by mouth every 6 (six) hours as needed for nausea or vomiting. 30 tablet 1   No current facility-administered medications on file prior to visit.    Allergies  Allergen Reactions   Aleve [Naproxen] Hives    Ok with ibuprofen   Hydrochlorothiazide Hives   Lisinopril Hives, Itching and  Swelling   Codeine Hives, Nausea And Vomiting and Rash   Latex Rash    Social History   Substance and Sexual Activity  Alcohol Use Yes   Comment: occasionally    Social History   Tobacco Use  Smoking Status Former   Types: Cigarettes   Passive exposure: Past  Smokeless Tobacco Never    Review of Systems  Constitutional: Negative.   HENT: Negative.    Eyes: Negative.   Respiratory: Negative.    Cardiovascular: Negative.   Gastrointestinal: Negative.   Genitourinary: Negative.   Musculoskeletal: Negative.   Skin: Negative.   Neurological: Negative.   Endo/Heme/Allergies: Negative.   Psychiatric/Behavioral: Negative.      Objective   Vitals:   12/25/22 1029  BP: (!) 160/112  Pulse: 76  Resp: 18  Temp: 98.4 F (36.9 C)  SpO2: 96%    Physical Exam Vitals reviewed.  Constitutional:      Appearance: Normal appearance. She is not ill-appearing.  HENT:     Head: Normocephalic and atraumatic.  Cardiovascular:     Rate and Rhythm: Normal rate and regular rhythm.     Heart sounds: Normal heart sounds. No murmur heard.    No friction rub. No gallop.  Pulmonary:     Effort: Pulmonary effort is normal. No respiratory distress.     Breath sounds: Normal breath sounds. No stridor. No wheezing, rhonchi or rales.  Skin:    General: Skin is warm and dry.     Comments: Cath in place left upper chest.  Neurological:     Mental Status: She is alert and oriented to person, place, and time.     Assessment  History of colon insert, finished with chemotherapy Plan  Patient is scheduled for Port-A-Cath removal in the minor procedure room on 12/29/2022.  The risks and benefits of the procedure were fully explained to the patient, who gave informed consent.

## 2022-12-29 ENCOUNTER — Encounter (HOSPITAL_COMMUNITY)
Admission: RE | Disposition: A | Discharge status: Home / Self Care | Payer: Self-pay | Source: Home / Self Care | Attending: General Surgery

## 2022-12-29 ENCOUNTER — Ambulatory Visit (HOSPITAL_COMMUNITY)
Admission: RE | Admit: 2022-12-29 | Discharge: 2022-12-29 | Disposition: A | Discharge status: Home / Self Care | Payer: 59 | Attending: General Surgery | Admitting: General Surgery

## 2022-12-29 DIAGNOSIS — Z452 Encounter for adjustment and management of vascular access device: Secondary | ICD-10-CM | POA: Diagnosis present

## 2022-12-29 DIAGNOSIS — Z85038 Personal history of other malignant neoplasm of large intestine: Secondary | ICD-10-CM | POA: Insufficient documentation

## 2022-12-29 HISTORY — PX: PORT-A-CATH REMOVAL: SHX5289

## 2022-12-29 SURGERY — MINOR REMOVAL PORT-A-CATH
Anesthesia: LOCAL

## 2022-12-29 MED ORDER — LIDOCAINE HCL (PF) 1 % IJ SOLN
INTRAMUSCULAR | Status: AC
  Filled 2022-12-29: qty 30

## 2022-12-29 MED ORDER — LIDOCAINE HCL (PF) 1 % IJ SOLN
INTRAMUSCULAR | Status: DC | PRN
  Administered 2022-12-29: 7 mL via SUBCUTANEOUS

## 2022-12-29 SURGICAL SUPPLY — 19 items
APPLICATOR CHLORAPREP 10.5 ORG (MISCELLANEOUS) ×1 IMPLANT
CLOTH BEACON ORANGE TIMEOUT ST (SAFETY) ×1 IMPLANT
DECANTER SPIKE VIAL GLASS SM (MISCELLANEOUS) ×1 IMPLANT
DERMABOND ADVANCED .7 DNX12 (GAUZE/BANDAGES/DRESSINGS) ×1 IMPLANT
DRAPE HALF SHEET 40X57 (DRAPES) IMPLANT
ELECT REM PT RETURN 9FT ADLT (ELECTROSURGICAL) ×1
ELECTRODE REM PT RTRN 9FT ADLT (ELECTROSURGICAL) ×1 IMPLANT
GLOVE BIOGEL PI IND STRL 7.0 (GLOVE) ×2 IMPLANT
GLOVE SURG SS PI 7.5 STRL IVOR (GLOVE) ×2 IMPLANT
GOWN STRL REUS W/TWL LRG LVL3 (GOWN DISPOSABLE) IMPLANT
NDL HYPO 25X1 1.5 SAFETY (NEEDLE) ×1 IMPLANT
NEEDLE HYPO 25X1 1.5 SAFETY (NEEDLE) ×1
PENCIL SMOKE EVACUATOR COATED (MISCELLANEOUS) IMPLANT
POSITIONER HEAD 8X9X4 ADT (SOFTGOODS) ×1 IMPLANT
SPONGE GAUZE 2X2 8PLY STRL LF (GAUZE/BANDAGES/DRESSINGS) ×1 IMPLANT
SUT MNCRL AB 4-0 PS2 18 (SUTURE) ×1 IMPLANT
SUT VIC AB 3-0 SH 27X BRD (SUTURE) ×1 IMPLANT
SYR CONTROL 10ML LL (SYRINGE) ×1 IMPLANT
TOWEL OR 17X26 4PK STRL BLUE (TOWEL DISPOSABLE) ×1 IMPLANT

## 2022-12-29 NOTE — Op Note (Signed)
Patient:  Alison Ramsey  DOB:  [birth year]  MRN:  [id number]   Preop Diagnosis: History of colon carcinoma, finished with chemotherapy  Postop Diagnosis: Same  Procedure: Port-A-Cath removal  Surgeon: [name], MD  Anes: Local  Indications: Patient is a 52 year old white female who presents for Port-A-Cath removal.  She is finished with chemotherapy.  The risks and benefits of the procedure including bleeding and infection were fully explained to the patient, who gave informed consent.  Procedure note: The procedure was done in the minor procedure room.  The left upper chest was prepped and draped using the usual sterile technique with ChloraPrep.  Surgical site confirmation was performed.  1% Xylocaine was used for local anesthesia.  An incision was made through the previous surgical scar.  This was taken down to the Port-A-Cath.  The Port-A-Cath was removed in total without difficulty.  It was disposed of.  The subcutaneous layer was reapproximated using a 3-0 Vicryl interrupted suture.  The skin was closed using a 4-0 Monocryl subcuticular suture.  Dermabond was applied.  All tape and needle counts were correct at the end of the procedure.  The patient tolerated the procedure well.  The patient is being discharged from the minor procedure room in good condition.  Complications: None  EBL: Minimal  Specimen: None

## 2022-12-29 NOTE — Interval H&P Note (Signed)
History and Physical Interval Note:  12/29/2022 9:12 AM  Alison Ramsey  has presented today for surgery, with the diagnosis of PORT A CATH IN PLACE.  The various methods of treatment have been discussed with the patient and family. After consideration of risks, benefits and other options for treatment, the patient has consented to  Procedure(s): MINOR REMOVAL PORT-A-CATH (N/A) as a surgical intervention.  The patient's history has been reviewed, patient examined, no change in status, stable for surgery.  I have reviewed the patient's chart and labs.  Questions were answered to the patient's satisfaction.     [name]

## 2023-01-02 ENCOUNTER — Encounter (HOSPITAL_COMMUNITY): Payer: Self-pay | Admitting: General Surgery

## 2023-01-07 ENCOUNTER — Emergency Department (HOSPITAL_COMMUNITY)
Admission: EM | Admit: 2023-01-07 | Discharge: 2023-01-08 | Disposition: A | Discharge status: Home / Self Care | Payer: 59 | Attending: Emergency Medicine | Admitting: Emergency Medicine

## 2023-01-07 ENCOUNTER — Encounter (HOSPITAL_COMMUNITY): Payer: Self-pay

## 2023-01-07 ENCOUNTER — Other Ambulatory Visit: Payer: Self-pay

## 2023-01-07 DIAGNOSIS — Z91040 Latex allergy status: Secondary | ICD-10-CM | POA: Insufficient documentation

## 2023-01-07 DIAGNOSIS — Z7982 Long term (current) use of aspirin: Secondary | ICD-10-CM | POA: Insufficient documentation

## 2023-01-07 DIAGNOSIS — R220 Localized swelling, mass and lump, head: Secondary | ICD-10-CM | POA: Diagnosis present

## 2023-01-07 DIAGNOSIS — T782XXA Anaphylactic shock, unspecified, initial encounter: Secondary | ICD-10-CM | POA: Diagnosis not present

## 2023-01-07 MED ORDER — METHYLPREDNISOLONE SODIUM SUCC 40 MG IJ SOLR
40.0000 mg | Freq: Once | INTRAMUSCULAR | Status: AC
  Administered 2023-01-07: 40 mg via INTRAVENOUS
  Filled 2023-01-07: qty 1

## 2023-01-07 MED ORDER — FAMOTIDINE IN NACL 20-0.9 MG/50ML-% IV SOLN
20.0000 mg | Freq: Once | INTRAVENOUS | Status: AC
  Administered 2023-01-07: 20 mg via INTRAVENOUS
  Filled 2023-01-07: qty 50

## 2023-01-07 MED ORDER — DIPHENHYDRAMINE HCL 50 MG/ML IJ SOLN
50.0000 mg | Freq: Once | INTRAMUSCULAR | Status: AC
  Administered 2023-01-07: 50 mg via INTRAVENOUS
  Filled 2023-01-07: qty 1

## 2023-01-07 NOTE — ED Triage Notes (Signed)
 Pt reports she had an allergic reaction tonight with tongue swelling, hive and itching.  Pt reports she took an Epipen and some benadryl and it has eased some of the swelling.

## 2023-01-07 NOTE — ED Provider Notes (Signed)
 [place] EMERGENCY DEPARTMENT AT [facility] Provider Note   CSN: [id number] Arrival date & time: 01/07/23  2136     History  Chief Complaint  Patient presents with   Allergic Reaction    Alison Ramsey is a 53 y.o. female.  Who presents to the ED for allergic reaction.  Unclear trigger tonight but most likely due to food allergy.  Patient reports eating [employer] earlier tonight around 1730.  She ate some more guacamole around 2030 then fell asleep.  She woke shortly after with tongue swelling, abdominal cramping, diarrhea and an itchy rash.  She administered IM EpiPen  at home and took 12.5 mg Benadryl  prior to arrival.  Husband at bedside notes decreased tongue swelling and states the patient was having difficulty talking due to tongue swelling earlier.  Patient feels as though her symptoms are improving after epi although she still notes very itchy legs.  No shortness of breath or wheezing.  Chest have a history of suspected allergic reaction to lisinopril  but has not taken this medication recently.  No other new exposures tonight.   Allergic Reaction      Home Medications Prior to Admission medications   Medication Sig Start Date End Date Taking? Authorizing Provider  acidophilus (RISAQUAD) CAPS capsule Take 1 capsule by mouth daily.    [provider]  amLODipine  (NORVASC ) 5 MG tablet Take 1 tablet (5 mg total) by mouth daily. 12/15/22   [facility] Pool, NP  aspirin EC 81 MG tablet Take 81 mg by mouth daily. Swallow whole.    [provider]  cetirizine (ZYRTEC) 10 MG tablet Take 10 mg by mouth daily.    [provider]  Lactobacillus (ACIDOPHILUS PO) Take 1 capsule by mouth at bedtime.    [provider]  MAGNESIUM PO Take 400 mg by mouth every evening.    [provider]  metoprolol  tartrate (LOPRESSOR ) 25 MG tablet Take 1 tablet (25 mg total) by mouth 2 (two) times daily. 12/23/22   [facility] Pool, NP   VITAMIN D  PO Take 1,000 Units by mouth at bedtime.    [provider]  prochlorperazine  (COMPAZINE ) 10 MG tablet Take 1 tablet (10 mg total) by mouth every 6 (six) hours as needed for nausea or vomiting. 05/27/21 09/09/21  Lamon Pleasant HERO, PA-C      Allergies    Aleve [naproxen], Hydrochlorothiazide , Lisinopril , Codeine, and Latex    Review of Systems   Review of Systems  Physical Exam Updated Vital Signs BP (!) 144/101   Pulse 100   Temp (!) 97.5 F (36.4 C) (Oral)   Resp 18   Ht 5' 5 (1.651 m)   Wt 106.6 kg   SpO2 93%   BMI 39.11 kg/m  Physical Exam Vitals and nursing note reviewed.  HENT:     Head: Normocephalic and atraumatic.     Mouth/Throat:     Comments: Mild tongue swelling Uvula midline Speaking in full sentences Eyes:     Pupils: Pupils are equal, round, and reactive to light.  Cardiovascular:     Rate and Rhythm: Normal rate and regular rhythm.  Pulmonary:     Effort: Pulmonary effort is normal.     Breath sounds: Normal breath sounds. No stridor. No wheezing.  Abdominal:     Palpations: Abdomen is soft.     Tenderness: There is no abdominal tenderness.  Skin:    General: Skin is warm and dry.     Findings:  Rash present.     Comments: Urticarial rash over legs with excoriations  Neurological:     Mental Status: She is alert.  Psychiatric:        Mood and Affect: Mood normal.     ED Results / Procedures / Treatments   Labs (all labs ordered are listed, but only abnormal results are displayed) Labs Reviewed - No data to display  EKG None  Radiology No results found.  Procedures Procedures    Medications Ordered in ED Medications  diphenhydrAMINE  (BENADRYL ) injection 50 mg (50 mg Intravenous Given 01/07/23 2242)  famotidine  (PEPCID ) IVPB 20 mg premix (0 mg Intravenous Stopped 01/07/23 2321)  methylPREDNISolone  sodium succinate (SOLU-MEDROL ) 40 mg/mL injection 40 mg (40 mg Intravenous Given 01/07/23 2243)    ED Course/ Medical  Decision Making/ A&P Clinical Course as of 01/08/23 0053  Thu Jan 08, 2023  0010 Patient has remained stable up to this point not requiring another dose of epi.  Improvement in her tongue swelling and pruritic rash.  LILLETTE Ozell Marine DO, am transitioning care of this patient to the oncoming provider pending reevaluation and disposition [MP]    Clinical Course User Index [MP] Marine Ozell LABOR, DO                                 Medical Decision Making 53 year old female presenting for allergic reaction.  No known food allergies but food is most likely trigger for her presentation tonight.  She had [employer] guacamole which she does not typically eat.  Administered EpiPen  at home around 2100.  Improvement in her symptoms.  On my exam she has urticarial rash over her legs with excoriations and some tongue swelling but airway is intact.  No wheezing or stridorous breath sounds.  Will give her IV Benadryl , steroids and famotidine  to help with her symptoms and continue to monitor to ascertain need for additional dose of epinephrine .  Will counsel her on food trigger and instruct her to stay away from what she ate tonight.  If she remains stable after period of a few hours here in the ED she can likely go home  Risk Prescription drug management.           Final Clinical Impression(s) / ED Diagnoses Final diagnoses:  Anaphylaxis, initial encounter    Rx / DC Orders ED Discharge Orders     None         Marine Ozell LABOR, DO 01/08/23 [id number]

## 2023-01-07 NOTE — ED Notes (Addendum)
 Pt stated she had an allergic reaction to something unknown. Upper and lower body is red and lower extremities are itchy. Able to communicate clearly no swelling seen on throat. Stated she took her epi and children's benadryl before arrival.

## 2023-01-08 NOTE — ED Provider Notes (Signed)
 Patient feeling improved, no acute distress.  No angioedema.  She.  Will for discharge home.  She does not want to start prednisone at home.  She has up to 3 EpiPen's at home.  Will refer to allergist   Zadie Rhine, MD 01/08/23 [id number]

## 2023-01-10 IMAGING — CT CT ABD-PELV W/ CM
2 of 5 series · 14 of 46 positions shown, 16 images · IV contrast (Omnipaque or Isovue)
Comparison: None.

CLINICAL DATA: Sob, chest pain, general abd pain/cramping x 2
weeks, worse in last couple days. Elevated d-dimer.



[Series 2: abd/pel w/ · axial · 0.86mm/px · z∈[+827,+1262]mm · 11 of 103 slices shown, 13 images]
[im 8/103  soft-tissue]
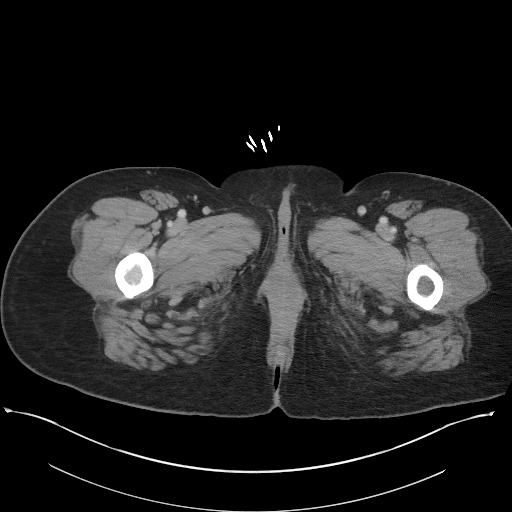
[im 8/103  bone]
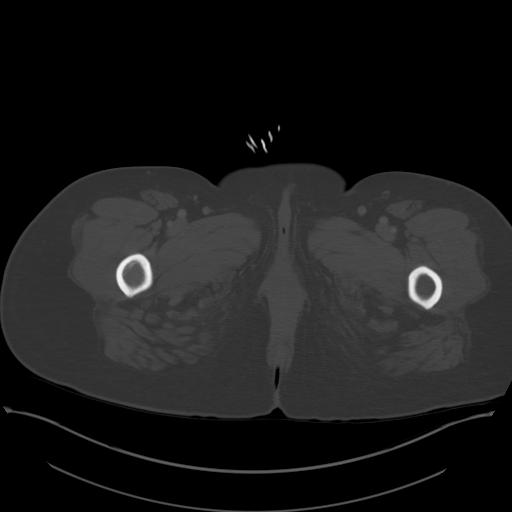
[im 15/103  soft-tissue]
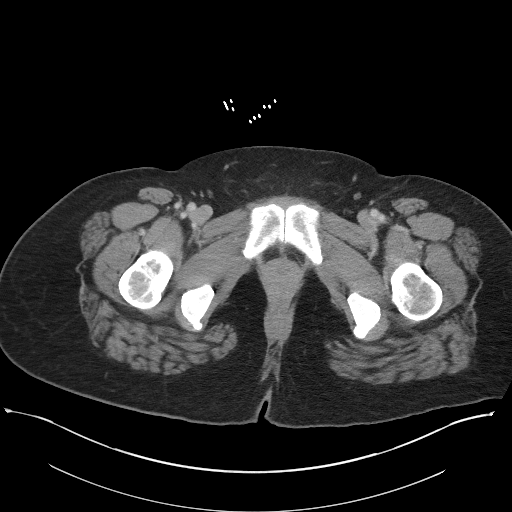
[im 22/103  soft-tissue]
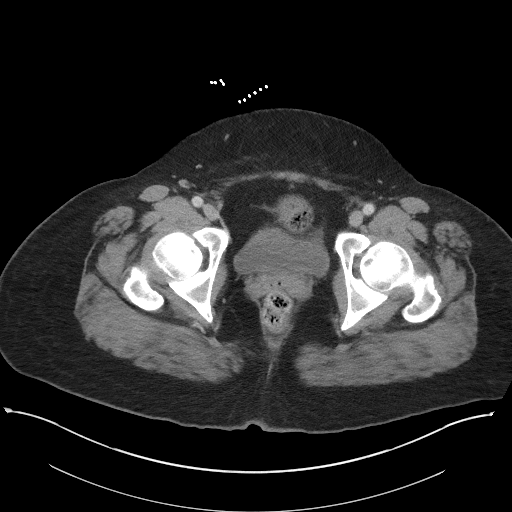
[im 37/103  soft-tissue]
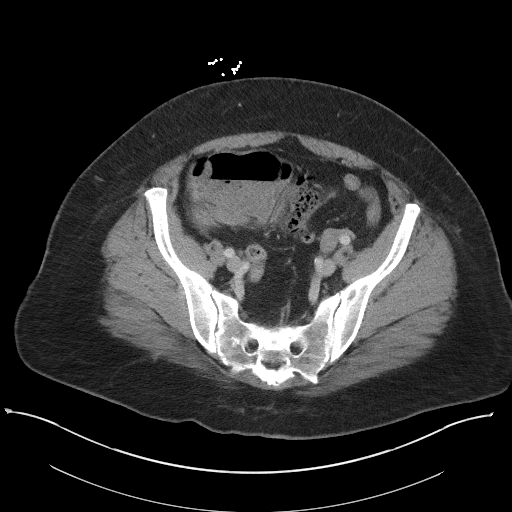
[im 44/103  soft-tissue]
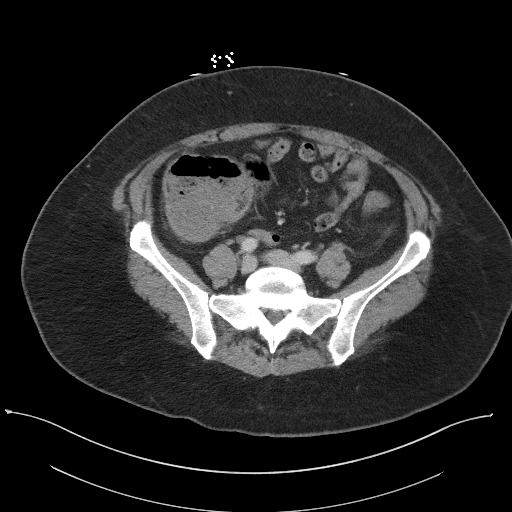
[im 52/103  soft-tissue]
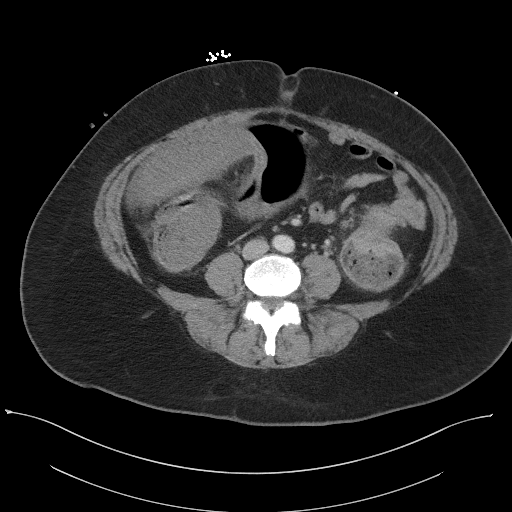
[im 59/103  soft-tissue]
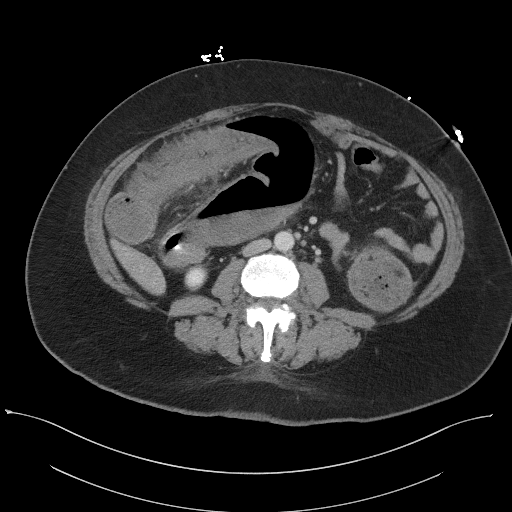
[im 66/103  soft-tissue]
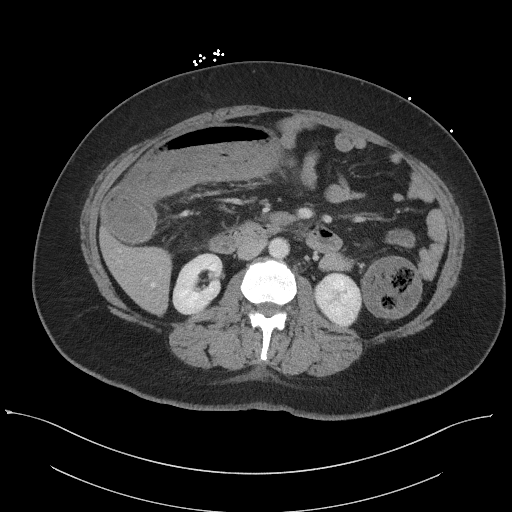
[im 81/103  soft-tissue]
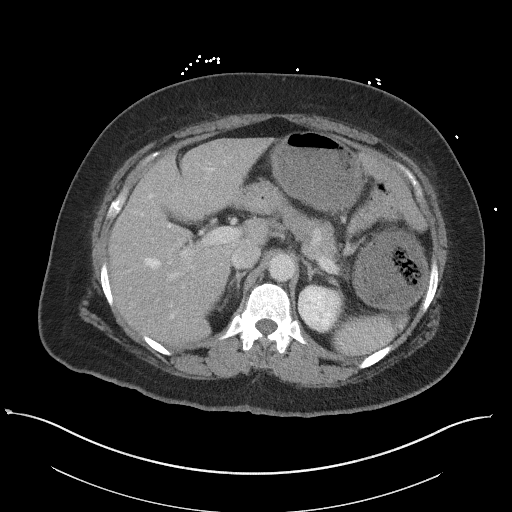
[im 81/103  bone]
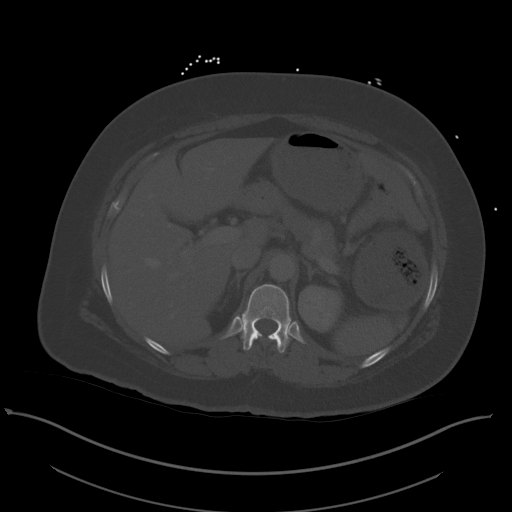
[im 88/103  soft-tissue]
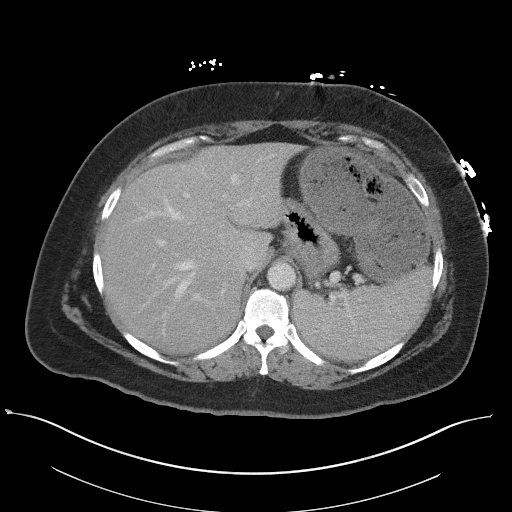
[im 95/103  soft-tissue]
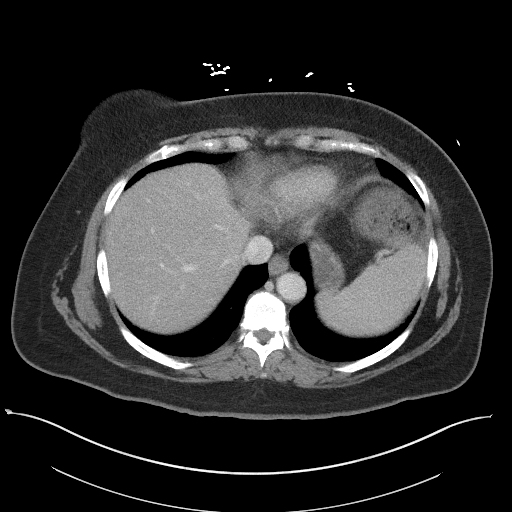

[Series 4: coronal · coronal · 0.86mm/px · 3 of 117 slices shown]
[im 39/117  soft-tissue]
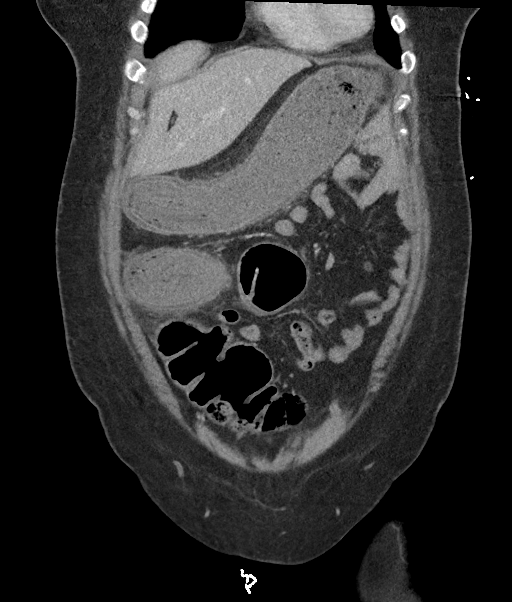
[im 52/117  soft-tissue]
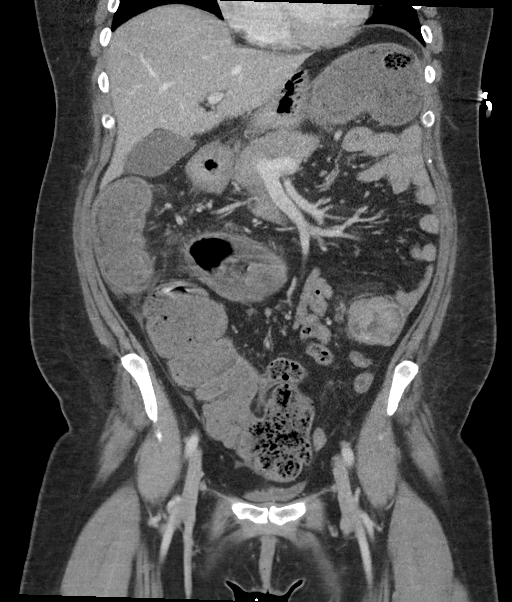
[im 65/117  soft-tissue]
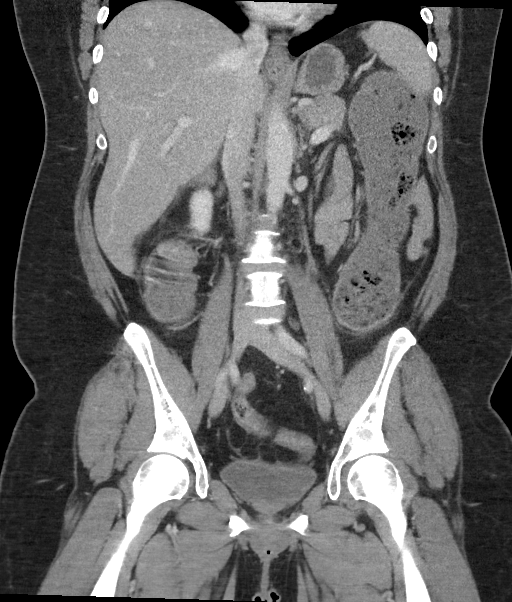

[14 of 46 positions shown; findings below may reference images not displayed]

RADIATION DOSE REDUCTION: This exam was performed according to the
departmental dose-optimization program which includes automated
exposure control, adjustment of the mA and/or kV according to
patient size and/or use of iterative reconstruction technique.

CONTRAST:  100mL OMNIPAQUE IOHEXOL 350 MG/ML SOLN
FINDINGS: CTA CHEST FINDINGS

Cardiovascular: Satisfactory opacification of the pulmonary arteries
to the segmental level. No evidence of pulmonary embolism. Normal
heart size. No significant pericardial effusion. The thoracic aorta
is normal in caliber. No atherosclerotic plaque of the thoracic
aorta. No coronary artery calcifications.

Mediastinum/Nodes: No enlarged mediastinal, hilar, or axillary lymph
nodes. Thyroid gland, trachea, and esophagus demonstrate no
significant findings. Pre-vascular 1.2 cm fluid density likely
benign etiology.

Lungs/Pleura: Expiratory phase of respiration. No focal
consolidation. There is a 4 mm right upper lobe calcified nodule
([DATE]). There is a 5 mm right upper lobe pulmonary nodule ([DATE]). No
pulmonary mass. No pleural effusion. No pneumothorax.

Musculoskeletal:

No chest wall abnormality.

No suspicious lytic or blastic osseous lesions. No acute displaced
fracture.

Review of the MIP images confirms the above findings.

CT ABDOMEN and PELVIS FINDINGS

Hepatobiliary: No focal liver abnormality. Couple of approximately 5
mm hyperdense foci along the gallbladder lumen ([DATE], 30). No
gallbladder wall thickening or pericholecystic fluid. No biliary
dilatation.

Pancreas: No focal lesion. Normal pancreatic contour. No surrounding
inflammatory changes. No main pancreatic ductal dilatation.

Spleen: Normal in size without focal abnormality.

Adrenals/Urinary Tract:

No adrenal nodule bilaterally.

Bilateral kidneys enhance symmetrically.

No definite nephrolithiasis.  No nephroureterolithiasis.

No hydronephrosis. No hydroureter.

The urinary bladder is unremarkable.

Stomach/Bowel: Stomach is within normal limits. No evidence of small
bowel wall thickening or dilatation. Fecalization material within
the lumen of the terminal ileum consistent with a slow transition
state. There is a 5 cm in length irregular bowel wall thickening of
distal descending colon with associated proximal increased stool
burden throughout the colon as well as bowel wall thickening and
pericolonic fat stranding. Appendix appears normal.

Vascular/Lymphatic: No abdominal aorta or iliac aneurysm. Prominent
but nonenlarged left lower quadrant cyst pericolonic mesenteric
lymph nodes ([DATE] 5-59). No abdominal, pelvic, or inguinal
lymphadenopathy.

Reproductive: Status post hysterectomy. No adnexal masses.

Other: Trace free fluid within the pelvis. No intraperitoneal free
gas. No organized fluid collection.

Musculoskeletal:

No abdominal wall hernia or abnormality.

No suspicious lytic or blastic osseous lesions. No acute displaced
fracture.

Review of the MIP images confirms the above findings.
IMPRESSION: 1. Obstructive 5 cm distal descending colon neoplasm with associated
proximal stercoral colitis. No bowel perforation.
2. Couple of indeterminate right upper lobe pulmonary nodules one
calcified and one noncalcified measuring up to 5 mm. Recommend
attention on follow-up.
3. Couple of approximately 5 mm hyperdense foci along the
gallbladder lumen may represent gallbladder polyp or stone.
Recommend right upper quadrant ultrasound further evaluation.
4. No pulmonary embolus.

## 2023-01-10 IMAGING — DX DG CHEST 1V PORT
1 series · 1 of 1 positions shown · non-contrast
Comparison: None.

CLINICAL DATA: Chest pain.

EXAM:
PORTABLE CHEST 1 VIEW

[chest ap grid]
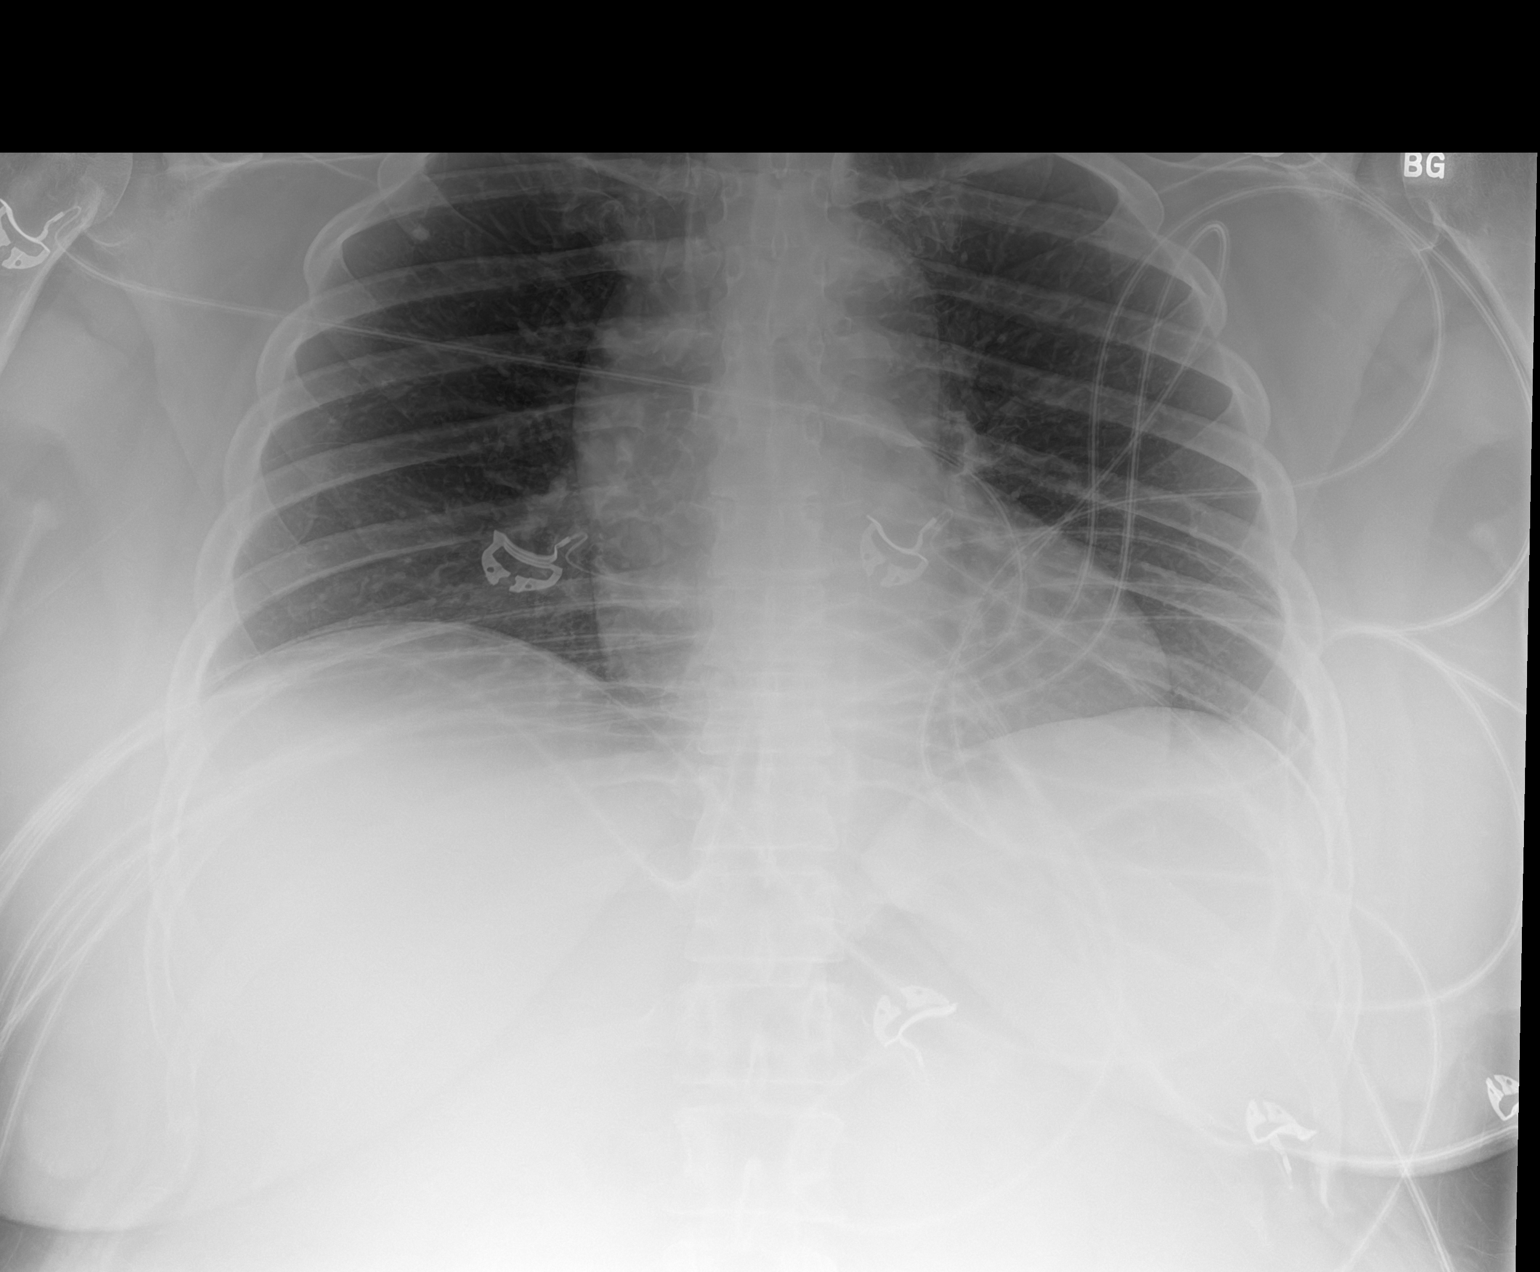

[1 of 1 positions shown; findings below may reference images not displayed]

FINDINGS: The heart size and mediastinal contours are within normal limits.
Both lungs are clear. Small calcified granuloma in the right upper
lobe. The visualized skeletal structures are unremarkable.
IMPRESSION: No active disease.

## 2023-01-12 ENCOUNTER — Telehealth: Payer: 59 | Admitting: Physician Assistant

## 2023-01-12 DIAGNOSIS — B3731 Acute candidiasis of vulva and vagina: Secondary | ICD-10-CM | POA: Diagnosis not present

## 2023-01-12 MED ORDER — FLUCONAZOLE 150 MG PO TABS: 150.0000 mg | ORAL_TABLET | ORAL | 0 refills | Status: DC | PRN

## 2023-01-12 NOTE — Progress Notes (Signed)

## 2023-01-27 ENCOUNTER — Encounter (HOSPITAL_COMMUNITY): Payer: Self-pay | Admitting: General Surgery

## 2023-02-03 ENCOUNTER — Telehealth: Payer: 59 | Admitting: Physician Assistant

## 2023-02-03 DIAGNOSIS — B9689 Other specified bacterial agents as the cause of diseases classified elsewhere: Secondary | ICD-10-CM

## 2023-02-03 DIAGNOSIS — J208 Acute bronchitis due to other specified organisms: Secondary | ICD-10-CM | POA: Diagnosis not present

## 2023-02-03 MED ORDER — AZITHROMYCIN 250 MG PO TABS: ORAL_TABLET | ORAL | 0 refills | Status: AC

## 2023-02-03 MED ORDER — ALBUTEROL SULFATE HFA 108 (90 BASE) MCG/ACT IN AERS: 2.0000 | INHALATION_SPRAY | Freq: Four times a day (QID) | RESPIRATORY_TRACT | 0 refills | Status: AC | PRN

## 2023-02-03 MED ORDER — BENZONATATE 100 MG PO CAPS: 100.0000 mg | ORAL_CAPSULE | Freq: Three times a day (TID) | ORAL | 0 refills | Status: AC | PRN

## 2023-02-03 NOTE — Progress Notes (Signed)
I have spent 5 minutes in review of e-visit questionnaire, review and updating patient chart, medical decision making and response to patient.   Piedad Climes, PA-C

## 2023-02-03 NOTE — Progress Notes (Signed)
E-Visit for Cough   We are sorry that you are not feeling well.  Here is how we plan to help!  Based on your presentation I believe you most likely have A cough due to bacteria.  When patients have a productive cough with a change in color or increased sputum production, we are concerned about bacterial bronchitis.  If left untreated it can progress to pneumonia.  If your symptoms do not improve with your treatment plan it is important that you contact your provider.   I have prescribed Azithromyin 250 mg: two tablets now and then one tablet daily for 4 additonal days    In addition you may use A prescription cough medication called Tessalon Perles 100mg . You may take 1-2 capsules every 8 hours as needed for your cough. I have also sent in an albuterol inhaler to help relax your airway. Please use as directed.   From your responses in the eVisit questionnaire you describe inflammation in the upper respiratory tract which is causing a significant cough.  This is commonly called Bronchitis and has four common causes:   Allergies Viral Infections Acid Reflux Bacterial Infection Allergies, viruses and acid reflux are treated by controlling symptoms or eliminating the cause. An example might be a cough caused by taking certain blood pressure medications. You stop the cough by changing the medication. Another example might be a cough caused by acid reflux. Controlling the reflux helps control the cough.  USE OF BRONCHODILATOR ("RESCUE") INHALERS: There is a risk from using your bronchodilator too frequently.  The risk is that over-reliance on a medication which only relaxes the muscles surrounding the breathing tubes can reduce the effectiveness of medications prescribed to reduce swelling and congestion of the tubes themselves.  Although you feel brief relief from the bronchodilator inhaler, your asthma may actually be worsening with the tubes becoming more swollen and filled with mucus.  This can  delay other crucial treatments, such as oral steroid medications. If you need to use a bronchodilator inhaler daily, several times per day, you should discuss this with your provider.  There are probably better treatments that could be used to keep your asthma under control.     HOME CARE Only take medications as instructed by your medical team. Complete the entire course of an antibiotic. Drink plenty of fluids and get plenty of rest. Avoid close contacts especially the very young and the elderly Cover your mouth if you cough or cough into your sleeve. Always remember to wash your hands A steam or ultrasonic humidifier can help congestion.   GET HELP RIGHT AWAY IF: You develop worsening fever. You become short of breath You cough up blood. Your symptoms persist after you have completed your treatment plan MAKE SURE YOU  Understand these instructions. Will watch your condition. Will get help right away if you are not doing well or get worse.    Thank you for choosing an e-visit.  Your e-visit answers were reviewed by a board certified advanced clinical practitioner to complete your personal care plan. Depending upon the condition, your plan could have included both over the counter or prescription medications.  Please review your pharmacy choice. Make sure the pharmacy is open so you can pick up prescription now. If there is a problem, you may contact your provider through [employer] and have the prescription routed to another pharmacy.  Your safety is important to [place]. If you have drug allergies check your prescription carefully.   For the next  24 hours you can use MyChart to ask questions about today's visit, request a non-urgent call back, or ask for a work or school excuse. You will get an email in the next two days asking about your experience. I hope that your e-visit has been valuable and will speed your recovery.

## 2023-02-10 ENCOUNTER — Ambulatory Visit: Payer: 59 | Admitting: Nurse Practitioner

## 2023-02-10 IMAGING — DX DG CHEST 1V PORT
1 series · 1 of 1 positions shown · non-contrast
Comparison: Chest x-ray dated April 12, 2021.

CLINICAL DATA: Port placement.

EXAM:
PORTABLE CHEST 1 VIEW

[chest ap]
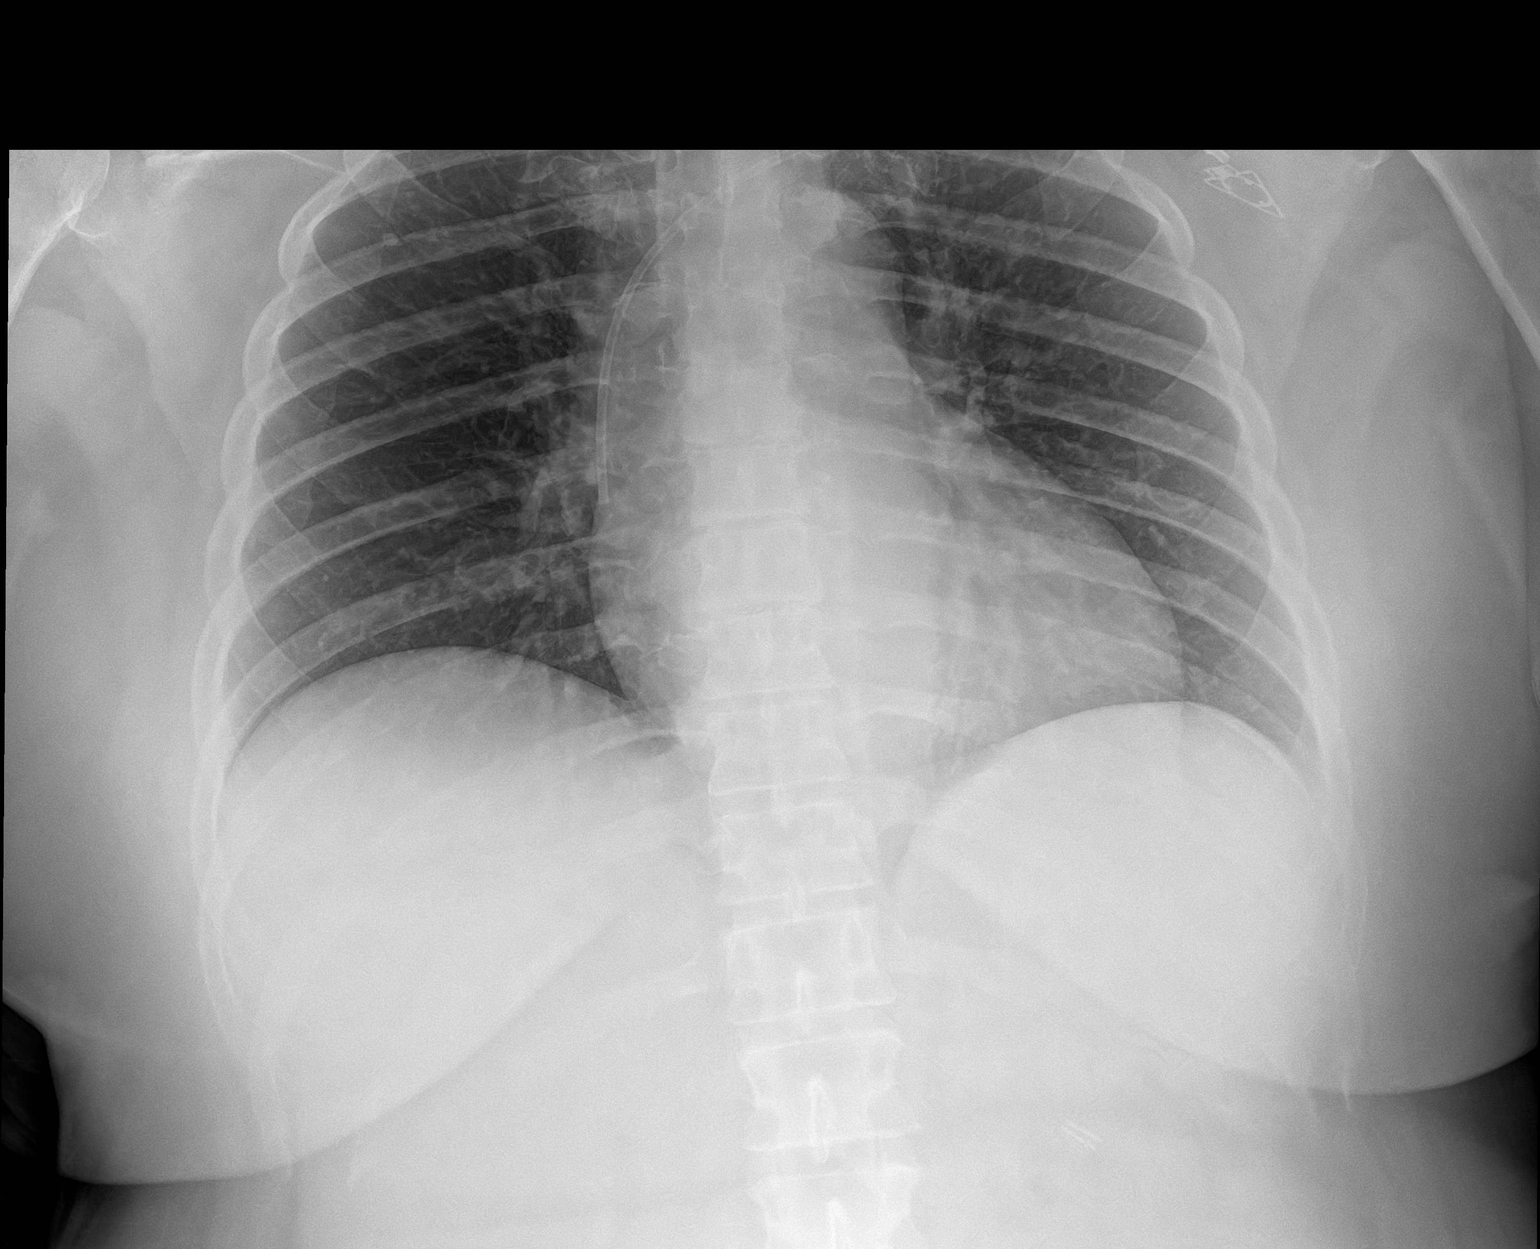

[1 of 1 positions shown; findings below may reference images not displayed]

FINDINGS: New left chest wall port catheter with tip near the cavoatrial
junction. The heart size and mediastinal contours are within normal
limits. Normal pulmonary vascularity. No focal consolidation,
pleural effusion, or pneumothorax. Unchanged tiny calcified
granuloma in the right upper lobe. No acute osseous abnormality.
IMPRESSION: 1. New left chest wall port catheter without complication.

## 2023-02-11 ENCOUNTER — Encounter: Payer: Self-pay | Admitting: Family Medicine

## 2023-02-11 ENCOUNTER — Ambulatory Visit: Payer: 59 | Admitting: Family Medicine

## 2023-02-11 VITALS — BP 160/106 | HR 93 | Temp 98.2°F | Ht 65.0 in | Wt 241.0 lb

## 2023-02-11 DIAGNOSIS — R0981 Nasal congestion: Secondary | ICD-10-CM | POA: Diagnosis not present

## 2023-02-11 DIAGNOSIS — Z87892 Personal history of anaphylaxis: Secondary | ICD-10-CM

## 2023-02-11 DIAGNOSIS — I10 Essential (primary) hypertension: Secondary | ICD-10-CM | POA: Diagnosis not present

## 2023-02-11 NOTE — Patient Instructions (Signed)

## 2023-02-11 NOTE — Progress Notes (Signed)
 Subjective:  Patient ID: Alison Ramsey, female    DOB: [birth year], 53 y.o.   MRN: [id number]  Patient Care Team: Deitra Morton Hummer, [name], NP as PCP - General (Nurse Practitioner) Rogers Hai, MD as Medical Oncologist (Medical Oncology) Celestia Joesph SQUIBB, RN as Oncology Nurse Navigator (Medical Oncology)   Chief Complaint:  ear/sinus infection (X 1 week)   HPI: Alison Ramsey is a 53 y.o. female presenting on 02/11/2023 for ear/sinus infection (X 1 week) States that she has had sinus pressure. States that it started on 1.5 weeks ago. States that symptoms improved except for her ears. She has PND and nasal sinus pressure. Denies fever. Using inhaler a few times per week. She is not taking anything OTC.   In addition, she is requesting permission to return to work. She reports that she was taken out of work for anaphylaxis and HTN. She had 3 different episodes of anaphylaxis in the 6 months. First time in November in [place] . Believed at that time that it was a reaction to lisinopril , so it was stopped then. She was then started on amlodipine  and metoprolol . States that the last episode she had gradual itching, hives, and tongue swelling. States that she stayed out of work and took benadryl  all day. She did not use her epipen  at this time. Reports that she stopped taking BP medication as she was fearful this was what was causing her symptoms. States that she was taking it at night and would wake up with symptoms and take benadryl . She states that for a time her BP was well controlled without medication, then started increasing as she started having cold symptoms. She had a an ED visit on January 1st for anaphylaxis believed to be due to food trigger. She has not followed up with Allergy or with Cardiology. Reports that she has history of hives with hydralazine  as well.    Relevant past medical, surgical, family, and social history reviewed and updated as indicated.  Allergies and  medications reviewed and updated. Data reviewed: Chart in Epic.   Past Medical History:  Diagnosis Date   Cancer [facility])    Colon cancer (HCC)    Family history of breast cancer    Family history of colon cancer    Family history of stomach cancer    History of kidney stones    Hypertension    Lyme disease 2018    Past Surgical History:  Procedure Laterality Date   ABDOMINAL HYSTERECTOMY  2013   APPENDECTOMY     BIOPSY  04/13/2021   Procedure: BIOPSY;  Surgeon: Cindie Carlin POUR, DO;  [facility];  Service: Endoscopy;;  descending colon mass   BIOPSY  01/24/2022   Procedure: BIOPSY;  Surgeon: Cindie Carlin POUR, DO;  [facility];  Service: Endoscopy;;   COLONOSCOPY WITH PROPOFOL  N/A 01/24/2022   Procedure: COLONOSCOPY WITH PROPOFOL ;  Surgeon: Cindie Carlin POUR, DO;  [facility];  Service: Endoscopy;  Laterality: N/A;  1:00 pm   COLONOSCOPY WITH PROPOFOL  N/A 08/15/2022   Procedure: COLONOSCOPY WITH PROPOFOL ;  Surgeon: Cindie Carlin POUR, DO;  [facility];  Service: Endoscopy;  Laterality: N/A;  11:30AM;ASA 2   FLEXIBLE SIGMOIDOSCOPY N/A 04/13/2021   Procedure: FLEXIBLE SIGMOIDOSCOPY;  Surgeon: Cindie Carlin POUR, DO;  [facility];  Service: Endoscopy;  Laterality: N/A;   HEMOSTASIS CLIP PLACEMENT  01/24/2022   Procedure: HEMOSTASIS CLIP PLACEMENT;  Surgeon: Cindie Carlin POUR, DO;  [facility];  Service: Endoscopy;;   PARTIAL COLECTOMY N/A 04/15/2021   Procedure: EXTENDED RIGHT HEMICOLECTOMY;  Surgeon: Mavis Anes, MD;  [facility];  Service: General;  Laterality: N/A;   POLYPECTOMY  01/24/2022   Procedure: POLYPECTOMY;  Surgeon: Cindie Carlin POUR, DO;  [facility];  Service: Endoscopy;;   POLYPECTOMY  08/15/2022   Procedure: POLYPECTOMY;  Surgeon: Cindie Carlin POUR, DO;  [facility];  Service: Endoscopy;;   PORT-A-CATH REMOVAL N/A 12/29/2022   Procedure: MINOR REMOVAL PORT-A-CATH;   Surgeon: Mavis Anes, MD;  [facility];  Service: General;  Laterality: N/A;   PORTACATH PLACEMENT Left 05/13/2021   Procedure: INSERTION PORT-A-CATH;  Surgeon: Mavis Anes, MD;  [facility];  Service: General;  Laterality: Left;   SUBMUCOSAL LIFTING INJECTION  01/24/2022   Procedure: SUBMUCOSAL LIFTING INJECTION;  Surgeon: Cindie Carlin POUR, DO;  [facility];  Service: Endoscopy;;   WISDOM TOOTH EXTRACTION      Social History   Socioeconomic History   Marital status: Married    Spouse name: Not on file   Number of children: Not on file   Years of education: Not on file   Highest education level: Associate degree: occupational, scientist, product/process development, or vocational program  Occupational History   Not on file  Tobacco Use   Smoking status: Former    Types: Cigarettes    Passive exposure: Past   Smokeless tobacco: Never  Vaping Use   Vaping status: Never Used  Substance and Sexual Activity   Alcohol use: Yes    Comment: occasionally   Drug use: Yes    Types: Marijuana   Sexual activity: Yes  Other Topics Concern   Not on file  Social History Narrative   Not on file   Social Drivers of Health   Financial Resource Strain: Low Risk  (02/10/2023)   Overall Financial Resource Strain (CARDIA)    Difficulty of Paying Living Expenses: Not very hard  Food Insecurity: No Food Insecurity (02/10/2023)   Hunger Vital Sign    Worried About Running Out of Food in the Last Year: Never true    Ran Out of Food in the Last Year: Never true  Transportation Needs: No Transportation Needs (02/10/2023)   PRAPARE - Administrator, Civil Service (Medical): No    Lack of Transportation (Non-Medical): No  Physical Activity: Sufficiently Active (02/10/2023)   Exercise Vital Sign    Days of Exercise per Week: 7 days    Minutes of Exercise per Session: 30 min  Stress: Patient Declined (02/10/2023)   [employer] of Occupational Health - Occupational Stress Questionnaire     Feeling of Stress : Patient declined  Social Connections: Moderately Integrated (02/10/2023)   Social Connection and Isolation Panel [NHANES]    Frequency of Communication with Friends and Family: More than three times a week    Frequency of Social Gatherings with Friends and Family: More than three times a week    Attends Religious Services: More than 4 times per year    Active Member of [employer] or Organizations: No    Attends Banker Meetings: Not on file    Marital Status: Married  Intimate Partner Violence: Not on file    Outpatient Encounter Medications as of 02/11/2023  Medication Sig   acidophilus (RISAQUAD) CAPS capsule Take 1 capsule by mouth daily.   albuterol  (VENTOLIN  HFA) 108 (90 Base) MCG/ACT inhaler Inhale 2 puffs into the lungs every 6 (six)  hours as needed for wheezing or shortness of breath.   amLODipine  (NORVASC ) 5 MG tablet Take 1 tablet (5 mg total) by mouth daily.   aspirin EC 81 MG tablet Take 81 mg by mouth daily. Swallow whole.   benzonatate  (TESSALON ) 100 MG capsule Take 1 capsule (100 mg total) by mouth 3 (three) times daily as needed for cough.   cetirizine (ZYRTEC) 10 MG tablet Take 10 mg by mouth daily.   fluconazole  (DIFLUCAN ) 150 MG tablet Take 1 tablet (150 mg total) by mouth every 3 (three) days as needed.   Lactobacillus (ACIDOPHILUS PO) Take 1 capsule by mouth at bedtime.   MAGNESIUM PO Take 400 mg by mouth every evening.   metoprolol  tartrate (LOPRESSOR ) 25 MG tablet Take 1 tablet (25 mg total) by mouth 2 (two) times daily.   spironolactone  (ALDACTONE ) 25 MG tablet Take 25 mg by mouth 2 (two) times daily.   VITAMIN D  PO Take 1,000 Units by mouth at bedtime.   [DISCONTINUED] prochlorperazine  (COMPAZINE ) 10 MG tablet Take 1 tablet (10 mg total) by mouth every 6 (six) hours as needed for nausea or vomiting.   No facility-administered encounter medications on file as of 02/11/2023.    Allergies  Allergen Reactions   Aleve [Naproxen] Hives    Ok  with ibuprofen   Hydrochlorothiazide  Hives   Lisinopril  Hives, Itching and Swelling   Codeine Hives, Nausea And Vomiting and Rash   Latex Rash    Review of Systems As per HPI  Objective:  BP (!) 160/106   Pulse 93   Temp 98.2 F (36.8 C)   Ht 5' 5 (1.651 m)   Wt 241 lb (109.3 kg)   SpO2 99%   BMI 40.10 kg/m    Wt Readings from Last 3 Encounters:  02/11/23 241 lb (109.3 kg)  01/07/23 235 lb (106.6 kg)  12/25/22 237 lb (107.5 kg)   Physical Exam Constitutional:      General: She is awake. She is not in acute distress.    Appearance: Normal appearance. She is well-developed and well-groomed. She is morbidly obese. She is not ill-appearing, toxic-appearing or diaphoretic.  HENT:     Right Ear: A middle ear effusion is present.     Left Ear: A middle ear effusion is present.     Nose: Congestion and rhinorrhea present. Rhinorrhea is clear.     Right Sinus: Maxillary sinus tenderness and frontal sinus tenderness present.     Left Sinus: Maxillary sinus tenderness and frontal sinus tenderness present.     Mouth/Throat:     Lips: Pink. No lesions.     Mouth: Mucous membranes are moist.     Tongue: No lesions.     Palate: No mass.     Pharynx: No pharyngeal swelling, oropharyngeal exudate, posterior oropharyngeal erythema, uvula swelling or postnasal drip.     Tonsils: No tonsillar exudate or tonsillar abscesses.  Cardiovascular:     Rate and Rhythm: Normal rate and regular rhythm.     Pulses: Normal pulses.          Radial pulses are 2+ on the right side and 2+ on the left side.       Posterior tibial pulses are 2+ on the right side and 2+ on the left side.     Heart sounds: Normal heart sounds. No murmur heard.    No gallop.  Pulmonary:     Effort: Pulmonary effort is normal. No respiratory distress.     Breath sounds: Normal  breath sounds. No stridor. No wheezing, rhonchi or rales.  Musculoskeletal:     Cervical back: Full passive range of motion without pain and neck  supple.     Right lower leg: No edema.     Left lower leg: No edema.  Lymphadenopathy:     Head:     Right side of head: No submental, submandibular, tonsillar or preauricular adenopathy.     Left side of head: No submental, submandibular, tonsillar or preauricular adenopathy.     Cervical:     Right cervical: No superficial or deep cervical adenopathy.    Left cervical: No superficial or deep cervical adenopathy.  Skin:    General: Skin is warm.     Capillary Refill: Capillary refill takes less than 2 seconds.  Neurological:     General: No focal deficit present.     Mental Status: She is alert, oriented to person, place, and time and easily aroused. Mental status is at baseline.     GCS: GCS eye subscore is 4. GCS verbal subscore is 5. GCS motor subscore is 6.     Motor: No weakness.  Psychiatric:        Attention and Perception: Attention and perception normal.        Mood and Affect: Mood and affect normal.        Speech: Speech normal.        Behavior: Behavior normal. Behavior is cooperative.        Thought Content: Thought content normal. Thought content does not include homicidal or suicidal ideation. Thought content does not include homicidal or suicidal plan.        Cognition and Memory: Cognition and memory normal.        Judgment: Judgment normal.     Results for orders placed or performed in visit on 12/02/22  Comprehensive metabolic panel   Collection Time: 12/02/22  1:50 PM  Result Value Ref Range   Sodium 133 (L) 135 - 145 mmol/L   Potassium 3.6 3.5 - 5.1 mmol/L   Chloride 101 98 - 111 mmol/L   CO2 24 22 - 32 mmol/L   Glucose, Bld 127 (H) 70 - 99 mg/dL   BUN 12 6 - 20 mg/dL   Creatinine, Ser 9.27 0.44 - 1.00 mg/dL   Calcium 8.7 (L) 8.9 - 10.3 mg/dL   Total Protein 7.3 6.5 - 8.1 g/dL   Albumin 4.1 3.5 - 5.0 g/dL   AST 21 15 - 41 U/L   ALT 32 0 - 44 U/L   Alkaline Phosphatase 80 38 - 126 U/L   Total Bilirubin 1.2 (H) <1.2 mg/dL   GFR, Estimated >39 >39  mL/min   Anion gap 8 5 - 15  CBC with Differential/Platelet   Collection Time: 12/02/22  1:50 PM  Result Value Ref Range   WBC 10.3 4.0 - 10.5 K/uL   RBC 4.72 3.87 - 5.11 MIL/uL   Hemoglobin 13.3 12.0 - 15.0 g/dL   HCT 61.3 63.9 - 53.9 %   MCV 81.8 80.0 - 100.0 fL   MCH 28.2 26.0 - 34.0 pg   MCHC 34.5 30.0 - 36.0 g/dL   RDW 85.6 88.4 - 84.4 %   Platelets 244 150 - 400 K/uL   nRBC 0.0 0.0 - 0.2 %   Neutrophils Relative % 71 %   Neutro Abs 7.5 1.7 - 7.7 K/uL   Lymphocytes Relative 20 %   Lymphs Abs 2.1 0.7 - 4.0 K/uL   Monocytes Relative 5 %  Monocytes Absolute 0.5 0.1 - 1.0 K/uL   Eosinophils Relative 2 %   Eosinophils Absolute 0.2 0.0 - 0.5 K/uL   Basophils Relative 1 %   Basophils Absolute 0.1 0.0 - 0.1 K/uL   Immature Granulocytes 1 %   Abs Immature Granulocytes 0.08 (H) 0.00 - 0.07 K/uL  CEA   Collection Time: 12/02/22  1:50 PM  Result Value Ref Range   CEA 2.0 0.0 - 4.7 ng/mL       02/11/2023   10:46 AM 12/15/2022    1:49 PM 09/23/2022    2:59 PM 06/12/2022    9:30 AM 05/13/2022    3:13 PM  Depression screen PHQ 2/9  Decreased Interest 0 0 0 0 0  Down, Depressed, Hopeless 0 0 0 0 0  PHQ - 2 Score 0 0 0 0 0  Altered sleeping 0 0 1 1 0  Tired, decreased energy 3 0 0 0 1  Change in appetite 0 0 0 0 0  Feeling bad or failure about yourself  0 0 0 0 0  Trouble concentrating 0 0 0 0 0  Moving slowly or fidgety/restless 0 0 0 0 0  Suicidal thoughts 0 0 0 0 0  PHQ-9 Score 3 0 1 1 1   Difficult doing work/chores Somewhat difficult Not difficult at all Not difficult at all Not difficult at all Not difficult at all       02/11/2023   10:46 AM 12/15/2022    1:49 PM 05/13/2022    3:14 PM 04/25/2022    2:32 PM  GAD 7 : Generalized Anxiety Score  Nervous, Anxious, on Edge 0 0 1 0  Control/stop worrying 0 0 0 0  Worry too much - different things 0 0 0 0  Trouble relaxing 0 0 1 0  Restless 0 0 0 0  Easily annoyed or irritable 0 0 0 0  Afraid - awful might happen 0 0 0 0   Total GAD 7 Score 0 0 2 0  Anxiety Difficulty Not difficult at all Not difficult at all Not difficult at all Not difficult at all   Pertinent labs & imaging results that were available during my care of the patient were reviewed by me and considered in my medical decision making.  Assessment & Plan:  Amour was seen today for ear/sinus infection.  Diagnoses and all orders for this visit:  History of anaphylaxis Did not clear patient to return to work. Recommend that she follow up with Allergy and cardiology. Discussed red flag symptoms with patient.  -     Ambulatory referral to Allergy  Primary hypertension BP elevated in office today. Unable to prescribe medication as she has complicated allergies. Recommend that patient follow up with PCP and Cardiology. Referral placed. Encouraged patient to decrease OTC medications that can increase BP. Discussed at length red flag symptoms and when to follow up.  -     Ambulatory referral to Cardiology  Sinus congestion Encouraged patient to continue antihistamine and start nasal corticosteroid. Patient declined prednisone burst due to allergy reaction. She is currently using tessalon  perles.    Continue all other maintenance medications.  Follow up plan: Return in about 1 week (around 02/18/2023).   Continue healthy lifestyle choices, including diet (rich in fruits, vegetables, and lean proteins, and low in salt and simple carbohydrates) and exercise (at least 30 minutes of moderate physical activity daily).  Written and verbal instructions provided   The above assessment and management plan was discussed  with the patient. The patient verbalized understanding of and has agreed to the management plan. Patient is aware to call the clinic if they develop any new symptoms or if symptoms persist or worsen. Patient is aware when to return to the clinic for a follow-up visit. Patient educated on when it is appropriate to go to the emergency  department.   Alison Kins, DNP-FNP [facility] [address] [place], [place] [address] [phone]

## 2023-02-18 ENCOUNTER — Encounter: Payer: Self-pay | Admitting: Allergy & Immunology

## 2023-02-18 ENCOUNTER — Other Ambulatory Visit: Payer: Self-pay

## 2023-02-18 ENCOUNTER — Ambulatory Visit: Payer: 59 | Admitting: Allergy & Immunology

## 2023-02-18 VITALS — BP 150/96 | HR 115 | Temp 98.2°F | Ht 66.0 in | Wt 241.8 lb

## 2023-02-18 DIAGNOSIS — J4520 Mild intermittent asthma, uncomplicated: Secondary | ICD-10-CM

## 2023-02-18 DIAGNOSIS — Z888 Allergy status to other drugs, medicaments and biological substances status: Secondary | ICD-10-CM | POA: Diagnosis not present

## 2023-02-18 DIAGNOSIS — T783XXD Angioneurotic edema, subsequent encounter: Secondary | ICD-10-CM | POA: Diagnosis not present

## 2023-02-18 DIAGNOSIS — L508 Other urticaria: Secondary | ICD-10-CM

## 2023-02-18 DIAGNOSIS — T464X5D Adverse effect of angiotensin-converting-enzyme inhibitors, subsequent encounter: Secondary | ICD-10-CM

## 2023-02-18 NOTE — Progress Notes (Deleted)
 Established Patient Office Visit  Subjective  Patient ID: Alison Ramsey, female    DOB: 1970-07-03  Age: 53 y.o. MRN: [id number]  No chief complaint on file.   HPI  Patient Active Problem List   Diagnosis Date Noted   History of colon cancer 12/29/2022   Genetic testing 07/18/2022   Family history of colon cancer 07/09/2022   Family history of breast cancer 07/09/2022   Family history of stomach cancer 07/09/2022   Iron deficiency anemia 07/22/2021   Colon cancer (HCC) 05/09/2021   Bowel obstruction (HCC) 04/12/2021   Hypertension 07/04/2016   Past Medical History:  Diagnosis Date   Angio-edema    Asthma    Cancer (HCC)    Colon cancer (HCC)    Eczema    Family history of breast cancer    Family history of colon cancer    Family history of stomach cancer    History of kidney stones    Hypertension    Lyme disease 2018   Urticaria    Past Surgical History:  Procedure Laterality Date   ABDOMINAL HYSTERECTOMY  2013   APPENDECTOMY     BIOPSY  04/13/2021   Procedure: BIOPSY;  Surgeon: Lanelle Bal, DO;  [facility];  Service: Endoscopy;;  descending colon mass   BIOPSY  01/24/2022   Procedure: BIOPSY;  Surgeon: Lanelle Bal, DO;  [facility];  Service: Endoscopy;;   COLONOSCOPY WITH PROPOFOL N/A 01/24/2022   Procedure: COLONOSCOPY WITH PROPOFOL;  Surgeon: Lanelle Bal, DO;  [facility];  Service: Endoscopy;  Laterality: N/A;  1:00 pm   COLONOSCOPY WITH PROPOFOL N/A 08/15/2022   Procedure: COLONOSCOPY WITH PROPOFOL;  Surgeon: Lanelle Bal, DO;  [facility];  Service: Endoscopy;  Laterality: N/A;  11:30AM;ASA 2   FLEXIBLE SIGMOIDOSCOPY N/A 04/13/2021   Procedure: FLEXIBLE SIGMOIDOSCOPY;  Surgeon: Lanelle Bal, DO;  [facility];  Service: Endoscopy;  Laterality: N/A;   HEMOSTASIS CLIP PLACEMENT  01/24/2022   Procedure: HEMOSTASIS CLIP PLACEMENT;  Surgeon: Lanelle Bal, DO;  [facility];  Service: Endoscopy;;   PARTIAL COLECTOMY N/A 04/15/2021   Procedure: EXTENDED RIGHT HEMICOLECTOMY;  Surgeon: [name], MD;  [facility];  Service: General;  Laterality: N/A;   POLYPECTOMY  01/24/2022   Procedure: POLYPECTOMY;  Surgeon: Lanelle Bal, DO;  [facility];  Service: Endoscopy;;   POLYPECTOMY  08/15/2022   Procedure: POLYPECTOMY;  Surgeon: Lanelle Bal, DO;  [facility];  Service: Endoscopy;;   PORT-A-CATH REMOVAL N/A 12/29/2022   Procedure: MINOR REMOVAL PORT-A-CATH;  Surgeon: [name], MD;  [facility];  Service: General;  Laterality: N/A;   PORTACATH PLACEMENT Left 05/13/2021   Procedure: INSERTION PORT-A-CATH;  Surgeon: [name], MD;  [facility];  Service: General;  Laterality: Left;   SUBMUCOSAL LIFTING INJECTION  01/24/2022   Procedure: SUBMUCOSAL LIFTING INJECTION;  Surgeon: Lanelle Bal, DO;  [facility];  Service: Endoscopy;;   WISDOM TOOTH EXTRACTION     Social History   Tobacco Use   Smoking status: Former    Types: Cigarettes    Passive exposure: Past   Smokeless tobacco: Never  Vaping Use   Vaping status: Never Used  Substance Use Topics   Alcohol use: Yes    Comment: occasionally   Drug use: Yes    Types: Marijuana   Social History   Socioeconomic History  Marital status: Married    Spouse name: Not on file   Number of children: Not on file   Years of education: Not on file   Highest education level: Associate degree: occupational, Scientist, product/process development, or vocational program  Occupational History   Not on file  Tobacco Use   Smoking status: Former    Types: Cigarettes    Passive exposure: Past   Smokeless tobacco: Never  Vaping Use   Vaping status: Never Used  Substance and Sexual Activity   Alcohol use: Yes    Comment: occasionally   Drug use: Yes    Types: Marijuana   Sexual activity: Yes  Other Topics Concern   Not on file  Social History Narrative    Not on file   Social Drivers of Health   Financial Resource Strain: Low Risk  (02/10/2023)   Overall Financial Resource Strain (CARDIA)    Difficulty of Paying Living Expenses: Not very hard  Food Insecurity: No Food Insecurity (02/10/2023)   Hunger Vital Sign    Worried About Running Out of Food in the Last Year: Never true    Ran Out of Food in the Last Year: Never true  Transportation Needs: No Transportation Needs (02/10/2023)   PRAPARE - Administrator, Civil Service (Medical): No    Lack of Transportation (Non-Medical): No  Physical Activity: Sufficiently Active (02/10/2023)   Exercise Vital Sign    Days of Exercise per Week: 7 days    Minutes of Exercise per Session: 30 min  Stress: Patient Declined (02/10/2023)   [employer] of Occupational Health - Occupational Stress Questionnaire    Feeling of Stress : Patient declined  Social Connections: Moderately Integrated (02/10/2023)   Social Connection and Isolation Panel [NHANES]    Frequency of Communication with Friends and Family: More than three times a week    Frequency of Social Gatherings with Friends and Family: More than three times a week    Attends Religious Services: More than 4 times per year    Active Member of [employer] or Organizations: No    Attends Engineer, structural: Not on file    Marital Status: Married  Catering manager Violence: Not on file   Family Status  Relation Name Status   Mother  Deceased   Father  Deceased   Brother  Alive   Mat Aunt  Deceased   Mat Aunt  Other   Mat Uncle  Deceased   Pat Aunt x2 Deceased   MGF  Deceased   Cousin  Deceased  No partnership data on file   Family History  Problem Relation Age of Onset   Kidney disease Mother    Hypertension Mother    Breast cancer Mother 1 - 28   Stroke Father    Colon cancer Maternal Aunt 49       d. 15   Other Maternal Aunt 36       pituitary cancer   Breast cancer Maternal Aunt 73   Stomach cancer Maternal Uncle  83   Breast cancer Paternal Aunt    Lung cancer Maternal Grandfather    Colon cancer Cousin        d. 30s; maternal first cousin   Allergies  Allergen Reactions   Aleve [Naproxen] Hives    Ok with ibuprofen   Hydrochlorothiazide Hives   Lisinopril Hives, Itching and Swelling   Prednisolone Hives   Codeine Hives, Nausea And Vomiting and Rash   Latex Rash  ROS Negative unless indicated in HPI   Objective:     There were no vitals taken for this visit. BP Readings from Last 3 Encounters:  02/18/23 (!) 150/96  02/11/23 (!) 160/106  01/07/23 (!) 144/101   Wt Readings from Last 3 Encounters:  02/18/23 241 lb 12.8 oz (109.7 kg)  02/11/23 241 lb (109.3 kg)  01/07/23 235 lb (106.6 kg)      Physical Exam   No results found for any visits on 02/19/23.  Last CBC Lab Results  Component Value Date   WBC 10.3 12/02/2022   HGB 13.3 12/02/2022   HCT 38.6 12/02/2022   MCV 81.8 12/02/2022   MCH 28.2 12/02/2022   RDW 14.3 12/02/2022   PLT 244 12/02/2022   Last metabolic panel Lab Results  Component Value Date   GLUCOSE 127 (H) 12/02/2022   NA 133 (L) 12/02/2022   K 3.6 12/02/2022   CL 101 12/02/2022   CO2 24 12/02/2022   BUN 12 12/02/2022   CREATININE 0.72 12/02/2022   GFRNONAA >60 12/02/2022   CALCIUM 8.7 (L) 12/02/2022   PHOS 3.5 04/16/2021   PROT 7.3 12/02/2022   ALBUMIN 4.1 12/02/2022   LABGLOB 2.7 04/25/2022   AGRATIO 1.7 04/25/2022   BILITOT 1.2 (H) 12/02/2022   ALKPHOS 80 12/02/2022   AST 21 12/02/2022   ALT 32 12/02/2022   ANIONGAP 8 12/02/2022   Last lipids Lab Results  Component Value Date   CHOL 167 04/25/2022   HDL 47 04/25/2022   LDLCALC 91 04/25/2022   TRIG 169 (H) 04/25/2022   CHOLHDL 3.6 04/25/2022   Last hemoglobin A1c No results found for: "HGBA1C" Last thyroid functions Lab Results  Component Value Date   TSH 2.340 04/25/2022        Assessment & Plan:  There are no diagnoses linked to this encounter. Continue healthy  lifestyle choices, including diet (rich in fruits, vegetables, and lean proteins, and low in salt and simple carbohydrates) and exercise (at least 30 minutes of moderate physical activity daily).     The above assessment and management plan was discussed with the patient. The patient verbalized understanding of and has agreed to the management plan. Patient is aware to call the clinic if they develop any new symptoms or if symptoms persist or worsen. Patient is aware when to return to the clinic for a follow-up visit. Patient educated on when it is appropriate to go to the emergency department.  No follow-ups on file.   Arrie Aran Santa Lighter, [facility] [address] [place], [place] [address] [phone]    Note: This document was prepared by [employer]ubin Milan voice dictation technology and any errors that results from this process are unintentional.

## 2023-02-18 NOTE — Addendum Note (Signed)
Addended by: Elsworth Soho on: 02/18/2023 04:54 PM   Modules accepted: Orders

## 2023-02-18 NOTE — Progress Notes (Signed)
NEW PATIENT  Date of Service/Encounter:  02/18/23  Consult requested by: Martina Sinner, NP   Assessment:   ACEI induced angioedema -checking labs for HAE to be on a safe side  Chronic urticaria - checking labs for alpha gal syndrome and others  White Coat Syndrome   Stage 3 colon cancer - s/p surgery + chemotherapy and rechecks every 3 months  Fatty liver  Preference for holistic/naturopathic treatments   Plan/Recommendations:   1. Angiotensin converting enzyme inhibitor-aggravated angioedema - I would definitely avoid ACE inhibitors for now. - Monitor your BP at home and write down the values for your PCP to manage your high blood pressure.  - I am going to get some labs to look for weird causes of swelling (like hereditary angioedema). - I would think that this is unlikely, but we will see.  - FMLA forms provided today.   2. Chronic urticaria - Your history does not have any "red flags" such as fevers, joint pains, or permanent skin changes that would be concerning for a more serious cause of hives.  - We will get some labs to rule out serious causes of hives: alpha gal panel, complete blood count, tryptase level, chronic urticaria panel, CMP, ESR, and CRP. - Chronic hives are often times a self limited process and will "burn themselves out" over 6-12 months, although this is not always the case.  - In the meantime, start suppressive dosing of antihistamines:   - Morning: Claritin (loratadine) 10mg  + Pepcid (famotidine) 20mg   - Evening: Zyrtec (cetirizine) 10mg  + Pepcid (famotidine) 20mg  - You can change this dosing at home, decreasing the dose as needed or increasing the dosing as needed.  - If you are not tolerating the medications or are tired of taking them every day, we can start treatment with a monthly injectable medication called Xolair.   3. Mild intermittent asthma, uncomplicated - Lung testing looks today. - I do not think that we need a daily  controller medication.   4. Return in about 3 months (around 05/18/2023). You can have the follow up appointment with Dr. Dellis Anes or a Nurse Practicioner (our Nurse Practitioners are excellent and always have Physician oversight!).    This note in its entirety was forwarded to the Provider who requested this consultation.  Subjective:   ALVEDA VANHORNE is a 53 y.o. female presenting today for evaluation of  Chief Complaint  Patient presents with   Urticaria   Pruritus   Allergic Reaction    Unknown cause of allergic reactions.   Angioedema    ANAE HAMS has a history of the following: Patient Active Problem List   Diagnosis Date Noted   History of colon cancer 12/29/2022   Genetic testing 07/18/2022   Family history of colon cancer 07/09/2022   Family history of breast cancer 07/09/2022   Family history of stomach cancer 07/09/2022   Iron deficiency anemia 07/22/2021   Colon cancer (HCC) 05/09/2021   Bowel obstruction (HCC) 04/12/2021   Hypertension 07/04/2016    History obtained from: chart review and patient.  Discussed the use of AI scribe software for clinical note transcription with the patient and/or guardian, who gave verbal consent to proceed.  NANAMI WHITELAW was referred by Martina Sinner, NP.     Elanor is a 53 y.o. female presenting for an evaluation of angioedema and urticaria .  Discussed the use of AI scribe software for clinical note transcription with the patient, who gave  verbal consent to proceed.  History of Present Illness   BRIENA SWINGLER is a 53 year old female with hypertension and colon cancer who presents with recurrent allergic reactions and medication allergies.  She experiences recurrent allergic reactions, initially suspected to be due to lisinopril. The first reaction occurred in November, followed by a second in January, which resulted in an emergency room visit for anaphylaxis. A third episode occurred while she was  awake, during which she experienced tongue swelling and managed the symptoms with Benadryl. She has since stopped taking lisinopril and is not currently on any blood pressure medication.  1 note reveals that she was on amlodipine 10 mg daily at 1 point as well.  As he is at her first emergency room visit for this was in November 2024.  Per the note, she presented with angioedema only.  She had been on lisinopril for 1 year at this time.  She was given epinephrine and Benadryl with significant improvement per the note.  Vitals were stable during the ER visit.  She received a dose of Decadron.  She received a burst of steroids as well as an EpiPen.  She had another ER visit in January 2025 on New Year's Day.  She apparently per the note was eating [employer] earlier in the evening.  She woke up with tongue swelling as well as cramping, diarrhea, and a rash.  This did not seem to be related to lisinopril as she had not taken it for a while at that point.  She has a history of multiple medication allergies, including reactions to prednisone, codeine, and hydrocodone, which cause hives and headaches. She also reports being highly allergic to various everyday products, including toothpaste and shampoo.   She takes Zyrtec and Claritin daily for allergies and has albuterol for respiratory issues, which she uses sparingly due to its potential to raise blood pressure. She experiences respiratory symptoms primarily in cold air or during brisk exercise.  She has a history of hypertension, which has been difficult to manage due to allergic reactions to multiple antihypertensive medications, including lisinopril and hydrochlorothiazide. Blood pressure readings at home are slightly elevated, but significantly higher in clinical settings, suggesting possible white coat hypertension. Currently, she is not on any prescribed antihypertensive medication and is managing with supplements like magnesium and beetroot. She has not  talked to her PCP about this regimen. Her blood pressure is elevated today.   She has a history of colon cancer, diagnosed at stage three, which was discovered following symptoms of bowel obstruction and vomiting. She underwent surgery and six months of chemotherapy, resulting in a fatty liver. She is currently not on any cancer medications and undergoes regular check-ups.  She follows with Dr. Ellin Saba here in [place].  At the last visit in December 2024, she was doing well.  She was status post chemotherapy from May through July of 2023. She was on capecitabine and oxaliplatin for that 57-month period in 2023.  She had her Port-A-Cath removed shortly after that visit.  Her family history includes her mother having high blood pressure and her son having Lyme disease and alpha-gal syndrome. She notes feeling unwell after consuming meat, similar to her son's condition.     Otherwise, there is no history of other atopic diseases, including drug allergies, stinging insect allergies, or contact dermatitis. There is no significant infectious history. Vaccinations are up to date.    Past Medical History: Patient Active Problem List   Diagnosis Date Noted  History of colon cancer 12/29/2022   Genetic testing 07/18/2022   Family history of colon cancer 07/09/2022   Family history of breast cancer 07/09/2022   Family history of stomach cancer 07/09/2022   Iron deficiency anemia 07/22/2021   Colon cancer (HCC) 05/09/2021   Bowel obstruction (HCC) 04/12/2021   Hypertension 07/04/2016    Medication List:  Allergies as of 02/18/2023       Reactions   Aleve [naproxen] Hives   Ok with ibuprofen   Hydrochlorothiazide Hives   Lisinopril Hives, Itching, Swelling   Prednisolone Hives   Codeine Hives, Nausea And Vomiting, Rash   Latex Rash        Medication List        Accurate as of February 18, 2023  2:23 PM. If you have any questions, ask your nurse or doctor.          STOP taking  these medications    fluconazole 150 MG tablet Commonly known as: DIFLUCAN Stopped by: Alfonse Spruce       TAKE these medications    acidophilus Caps capsule Take 1 capsule by mouth daily.   ACIDOPHILUS PO Take 1 capsule by mouth at bedtime.   albuterol 108 (90 Base) MCG/ACT inhaler Commonly known as: VENTOLIN HFA Inhale 2 puffs into the lungs every 6 (six) hours as needed for wheezing or shortness of breath.   amLODipine 5 MG tablet Commonly known as: NORVASC Take 1 tablet (5 mg total) by mouth daily.   aspirin EC 81 MG tablet Take 81 mg by mouth daily. Swallow whole.   benzonatate 100 MG capsule Commonly known as: TESSALON Take 1 capsule (100 mg total) by mouth 3 (three) times daily as needed for cough.   cetirizine 10 MG tablet Commonly known as: ZYRTEC Take 10 mg by mouth daily.   loratadine 10 MG tablet Commonly known as: CLARITIN Take 10 mg by mouth daily.   MAGNESIUM PO Take 400 mg by mouth every evening.   metoprolol tartrate 25 MG tablet Commonly known as: LOPRESSOR Take 1 tablet (25 mg total) by mouth 2 (two) times daily.   spironolactone 25 MG tablet Commonly known as: ALDACTONE Take 25 mg by mouth 2 (two) times daily.   VITAMIN D PO Take 1,000 Units by mouth at bedtime.        Birth History: non-contributory  Developmental History: non-contributory  Past Surgical History: Past Surgical History:  Procedure Laterality Date   ABDOMINAL HYSTERECTOMY  2013   APPENDECTOMY     BIOPSY  04/13/2021   Procedure: BIOPSY;  Surgeon: Lanelle Bal, DO;  [facility];  Service: Endoscopy;;  descending colon mass   BIOPSY  01/24/2022   Procedure: BIOPSY;  Surgeon: Lanelle Bal, DO;  [facility];  Service: Endoscopy;;   COLONOSCOPY WITH PROPOFOL N/A 01/24/2022   Procedure: COLONOSCOPY WITH PROPOFOL;  Surgeon: Lanelle Bal, DO;  [facility];  Service: Endoscopy;  Laterality: N/A;  1:00 pm    COLONOSCOPY WITH PROPOFOL N/A 08/15/2022   Procedure: COLONOSCOPY WITH PROPOFOL;  Surgeon: Lanelle Bal, DO;  [facility];  Service: Endoscopy;  Laterality: N/A;  11:30AM;ASA 2   FLEXIBLE SIGMOIDOSCOPY N/A 04/13/2021   Procedure: FLEXIBLE SIGMOIDOSCOPY;  Surgeon: Lanelle Bal, DO;  [facility];  Service: Endoscopy;  Laterality: N/A;   HEMOSTASIS CLIP PLACEMENT  01/24/2022   Procedure: HEMOSTASIS CLIP PLACEMENT;  Surgeon: Lanelle Bal, DO;  [facility];  Service: Endoscopy;;   PARTIAL COLECTOMY N/A 04/15/2021   Procedure: EXTENDED RIGHT HEMICOLECTOMY;  Surgeon: [name], MD;  Location: [facility];  Service: General;  Laterality: N/A;   POLYPECTOMY  01/24/2022   Procedure: POLYPECTOMY;  Surgeon: Lanelle Bal, DO;  [facility];  Service: Endoscopy;;   POLYPECTOMY  08/15/2022   Procedure: POLYPECTOMY;  Surgeon: Lanelle Bal, DO;  [facility];  Service: Endoscopy;;   PORT-A-CATH REMOVAL N/A 12/29/2022   Procedure: MINOR REMOVAL PORT-A-CATH;  Surgeon: [name], MD;  Location: [facility];  Service: General;  Laterality: N/A;   PORTACATH PLACEMENT Left 05/13/2021   Procedure: INSERTION PORT-A-CATH;  Surgeon: [name], MD;  Location: [facility];  Service: General;  Laterality: Left;   SUBMUCOSAL LIFTING INJECTION  01/24/2022   Procedure: SUBMUCOSAL LIFTING INJECTION;  Surgeon: Lanelle Bal, DO;  [facility];  Service: Endoscopy;;   WISDOM TOOTH EXTRACTION       Family History: Family History  Problem Relation Age of Onset   Kidney disease Mother    Hypertension Mother    Breast cancer Mother 42 - 78   Stroke Father    Colon cancer Maternal Aunt 49       d. 70   Other Maternal Aunt 31       pituitary cancer   Breast cancer Maternal Aunt 54   Stomach cancer Maternal Uncle 83   Breast cancer Paternal Aunt    Lung cancer Maternal Grandfather    Colon cancer Cousin        d. 30s; maternal  first cousin     Social History: Annaleigha lives at home with her family.  They live in an apartment of unknown age.  There is laminate in the main living areas and carpeting in the bedroom.  They have electric heating and central cooling.  There is 1 cat and 2 dogs in the home.  There are dust mite covers on the bed, but not the pillows.  There is no tobacco exposure.  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   Review of systems otherwise negative other than that mentioned in the HPI.    Objective:   Blood pressure (!) 150/96, pulse (!) 115, temperature 98.2 F (36.8 C), height 5\' 6"  (1.676 m), weight 241 lb 12.8 oz (109.7 kg), SpO2 96%. Body mass index is 39.03 kg/m.     Physical Exam Vitals reviewed.  Constitutional:      Appearance: She is well-developed.     Comments: Talkative.  Cooperative.  HENT:     Head: Normocephalic and atraumatic.     Right Ear: Tympanic membrane, ear canal and external ear normal. No drainage, swelling or tenderness. Tympanic membrane is not injected, scarred, erythematous, retracted or bulging.     Left Ear: Tympanic membrane, ear canal and external ear normal. No drainage, swelling or tenderness. Tympanic membrane is not injected, scarred, erythematous, retracted or bulging.     Nose: No nasal deformity, septal deviation, mucosal edema or rhinorrhea.     Right Turbinates: Enlarged, swollen and pale.     Left Turbinates: Enlarged, swollen and pale.     Right Sinus: No maxillary sinus tenderness or frontal sinus tenderness.     Left Sinus: No maxillary sinus tenderness or frontal sinus tenderness.     Mouth/Throat:     Mouth: Mucous membranes are not pale and not dry.     Pharynx: Uvula midline.  Eyes:  General:        Right eye: No discharge.        Left eye: No discharge.     Conjunctiva/sclera: Conjunctivae normal.     Right eye: Right conjunctiva is not injected. No chemosis.    Left eye: Left conjunctiva is not  injected. No chemosis.    Pupils: Pupils are equal, round, and reactive to light.  Cardiovascular:     Rate and Rhythm: Normal rate and regular rhythm.     Heart sounds: Normal heart sounds.  Pulmonary:     Effort: Pulmonary effort is normal. No tachypnea, accessory muscle usage or respiratory distress.     Breath sounds: Normal breath sounds. No wheezing, rhonchi or rales.  Chest:     Chest wall: No tenderness.  Abdominal:     Tenderness: There is no abdominal tenderness. There is no guarding or rebound.  Lymphadenopathy:     Head:     Right side of head: No submandibular, tonsillar or occipital adenopathy.     Left side of head: No submandibular, tonsillar or occipital adenopathy.     Cervical: No cervical adenopathy.  Skin:    Coloration: Skin is not pale.     Findings: No abrasion, erythema, petechiae or rash. Rash is not papular, urticarial or vesicular.  Neurological:     Mental Status: She is alert.  Psychiatric:        Behavior: Behavior is cooperative.      Diagnostic studies:    Spirometry: results normal (FEV1: 2.51/93%, FVC: 3.30/99%, FEV1/FVC: 76%).    Spirometry consistent with normal pattern.   Allergy Studies: labs sent instead          Malachi Bonds, MD [facility]

## 2023-02-18 NOTE — Patient Instructions (Addendum)
1. Angiotensin converting enzyme inhibitor-aggravated angioedema - I would definitely avoid ACE inhibitors for now. - Monitor your BP at home and write down the values for your PCP to manage your high blood pressure.  - I am going to get some labs to look for weird causes of swelling (like hereditary angioedema). - I would think that this is unlikely, but we will see.  - FMLA forms provided today.   2. Chronic urticaria - Your history does not have any "red flags" such as fevers, joint pains, or permanent skin changes that would be concerning for a more serious cause of hives.  - We will get some labs to rule out serious causes of hives: alpha gal panel, complete blood count, tryptase level, chronic urticaria panel, CMP, ESR, and CRP. - Chronic hives are often times a self limited process and will "burn themselves out" over 6-12 months, although this is not always the case.  - In the meantime, start suppressive dosing of antihistamines:   - Morning: Claritin (loratadine) 10mg  + Pepcid (famotidine) 20mg   - Evening: Zyrtec (cetirizine) 10mg  + Pepcid (famotidine) 20mg  - You can change this dosing at home, decreasing the dose as needed or increasing the dosing as needed.  - If you are not tolerating the medications or are tired of taking them every day, we can start treatment with a monthly injectable medication called Xolair.   3. Mild intermittent asthma, uncomplicated - Lung testing looks today. - I do not think that we need a daily controller medication.   4. Return in about 3 months (around 05/18/2023). You can have the follow up appointment with Dr. Dellis Anes or a Nurse Practicioner (our Nurse Practitioners are excellent and always have Physician oversight!).    Please inform us of any Emergency Department visits, hospitalizations, or changes in symptoms. Call us before going to the ED for breathing or allergy symptoms since we might be able to fit you in for a sick visit. Feel free to  contact us anytime with any questions, problems, or concerns.  It was a pleasure to meet you today!  Websites that have reliable patient information: 1. American Academy of Asthma, Allergy, and Immunology: www.aaaai.org 2. Food Allergy Research and Education (FARE): foodallergy.org 3. Mothers of Asthmatics: http://www.asthmacommunitynetwork.org 4. American College of Allergy, Asthma, and Immunology: www.acaai.org      "Like" [place] on Facebook and Instagram for our latest updates!      A healthy democracy works best when [employer] participate! Make sure you are registered to vote! If you have moved or changed any of your contact information, you will need to get this updated before voting! Scan the QR codes below to learn more!

## 2023-02-19 ENCOUNTER — Ambulatory Visit: Payer: 59 | Admitting: Nurse Practitioner

## 2023-02-20 ENCOUNTER — Encounter: Payer: Self-pay | Admitting: Allergy & Immunology

## 2023-02-20 LAB — ALPHA-GAL PANEL
Allergen Lamb IgE: 1.6 kU/L — AB
Beef IgE: 2.07 kU/L — AB
IgE (Immunoglobulin E), Serum: 81 [IU]/mL (ref 6–495)
O215-IgE Alpha-Gal: 2.96 kU/L — AB
Pork IgE: 1.12 kU/L — AB

## 2023-02-27 LAB — CMP14+EGFR
ALT: 38 [IU]/L — ABNORMAL HIGH (ref 0–32)
AST: 27 [IU]/L (ref 0–40)
Albumin: 4.5 g/dL (ref 3.8–4.9)
Alkaline Phosphatase: 129 [IU]/L — ABNORMAL HIGH (ref 44–121)
BUN/Creatinine Ratio: 19 (ref 9–23)
BUN: 16 mg/dL (ref 6–24)
Bilirubin Total: 0.6 mg/dL (ref 0.0–1.2)
CO2: 22 mmol/L (ref 20–29)
Calcium: 9.6 mg/dL (ref 8.7–10.2)
Chloride: 100 mmol/L (ref 96–106)
Creatinine, Ser: 0.84 mg/dL (ref 0.57–1.00)
Globulin, Total: 2.5 g/dL (ref 1.5–4.5)
Glucose: 283 mg/dL — ABNORMAL HIGH (ref 70–99)
Potassium: 4.3 mmol/L (ref 3.5–5.2)
Sodium: 138 mmol/L (ref 134–144)
Total Protein: 7 g/dL (ref 6.0–8.5)
eGFR: 84 mL/min/{1.73_m2} (ref >59)

## 2023-02-27 LAB — C1 ESTERASE INHIBITOR: C1INH SerPl-mCnc: 33 mg/dL (ref 21–39)

## 2023-02-27 LAB — COMPLEMENT COMPONENT C1Q: Complement C1Q: 18.7 mg/dL (ref 10.3–20.5)

## 2023-02-27 LAB — THYROID ANTIBODIES (THYROPEROXIDASE & THYROGLOBULIN)
Thyroglobulin Antibody: <1 [IU]/mL (ref 0.0–0.9)
Thyroperoxidase Ab SerPl-aCnc: 12 [IU]/mL (ref 0–34)

## 2023-02-27 LAB — CHRONIC URTICARIA: cu index: 5.2 (ref <10)

## 2023-02-27 LAB — C-REACTIVE PROTEIN: CRP: 8 mg/L (ref 0–10)

## 2023-02-27 LAB — ANTINUCLEAR ANTIBODIES, IFA: ANA Titer 1: NEGATIVE

## 2023-02-27 LAB — C3 AND C4
Complement C3, Serum: 140 mg/dL (ref 82–167)
Complement C4, Serum: 27 mg/dL (ref 12–38)

## 2023-02-27 LAB — TRYPTASE: Tryptase: 6.5 ug/L (ref 2.2–13.2)

## 2023-02-27 LAB — SEDIMENTATION RATE: Sed Rate: 10 mm/h (ref 0–40)

## 2023-02-27 LAB — C1 ESTERASE INHIBITOR, FUNCTIONAL: C1INH Functional/C1INH Total MFr SerPl: 96 %{normal}

## 2023-03-09 ENCOUNTER — Ambulatory Visit: Payer: 59 | Admitting: Nurse Practitioner

## 2023-03-10 ENCOUNTER — Inpatient Hospital Stay: Payer: Self-pay | Attending: Hematology

## 2023-03-10 DIAGNOSIS — Z87891 Personal history of nicotine dependence: Secondary | ICD-10-CM | POA: Insufficient documentation

## 2023-03-10 DIAGNOSIS — Z79899 Other long term (current) drug therapy: Secondary | ICD-10-CM | POA: Insufficient documentation

## 2023-03-10 DIAGNOSIS — Z9221 Personal history of antineoplastic chemotherapy: Secondary | ICD-10-CM | POA: Insufficient documentation

## 2023-03-10 DIAGNOSIS — C186 Malignant neoplasm of descending colon: Secondary | ICD-10-CM

## 2023-03-10 DIAGNOSIS — Z85038 Personal history of other malignant neoplasm of large intestine: Secondary | ICD-10-CM | POA: Insufficient documentation

## 2023-03-10 LAB — COMPREHENSIVE METABOLIC PANEL
ALT: 40 U/L (ref 0–44)
AST: 28 U/L (ref 15–41)
Albumin: 4.1 g/dL (ref 3.5–5.0)
Alkaline Phosphatase: 88 U/L (ref 38–126)
Anion gap: 8 (ref 5–15)
BUN: 11 mg/dL (ref 6–20)
CO2: 26 mmol/L (ref 22–32)
Calcium: 9.3 mg/dL (ref 8.9–10.3)
Chloride: 99 mmol/L (ref 98–111)
Creatinine, Ser: 0.77 mg/dL (ref 0.44–1.00)
GFR, Estimated: >60 mL/min (ref >60)
Glucose, Bld: 174 mg/dL — ABNORMAL HIGH (ref 70–99)
Potassium: 3.9 mmol/L (ref 3.5–5.1)
Sodium: 133 mmol/L — ABNORMAL LOW (ref 135–145)
Total Bilirubin: 0.9 mg/dL (ref 0.0–1.2)
Total Protein: 7.1 g/dL (ref 6.5–8.1)

## 2023-03-10 LAB — CBC WITH DIFFERENTIAL/PLATELET
Abs Immature Granulocytes: 0.05 10*3/uL (ref 0.00–0.07)
Basophils Absolute: 0.1 10*3/uL (ref 0.0–0.1)
Basophils Relative: 1 %
Eosinophils Absolute: 0.1 10*3/uL (ref 0.0–0.5)
Eosinophils Relative: 1 %
HCT: 39.2 % (ref 36.0–46.0)
Hemoglobin: 13.4 g/dL (ref 12.0–15.0)
Immature Granulocytes: 1 %
Lymphocytes Relative: 24 %
Lymphs Abs: 2.3 10*3/uL (ref 0.7–4.0)
MCH: 27.9 pg (ref 26.0–34.0)
MCHC: 34.2 g/dL (ref 30.0–36.0)
MCV: 81.5 fL (ref 80.0–100.0)
Monocytes Absolute: 0.6 10*3/uL (ref 0.1–1.0)
Monocytes Relative: 7 %
Neutro Abs: 6.2 10*3/uL (ref 1.7–7.7)
Neutrophils Relative %: 66 %
Platelets: 244 10*3/uL (ref 150–400)
RBC: 4.81 MIL/uL (ref 3.87–5.11)
RDW: 14.1 % (ref 11.5–15.5)
WBC: 9.3 10*3/uL (ref 4.0–10.5)
nRBC: 0 % (ref 0.0–0.2)

## 2023-03-11 LAB — CEA: CEA: 2.4 ng/mL (ref 0.0–4.7)

## 2023-03-17 ENCOUNTER — Inpatient Hospital Stay: Payer: Self-pay | Admitting: Hematology

## 2023-03-19 ENCOUNTER — Encounter (HOSPITAL_COMMUNITY): Payer: Self-pay | Admitting: Hematology

## 2023-03-20 LAB — SIGNATERA
SIGNATERA MTM READOUT: 0 MTM/ml
SIGNATERA TEST RESULT: NEGATIVE

## 2023-03-23 NOTE — Progress Notes (Incomplete)
 [facility] [address], [place] [address]    Clinic Day:  03/23/2023  Referring physician: Junie Spencer, FNP  Patient Care Team: Evern Bio, Dois Davenport, NP as PCP - General (Nurse Practitioner) [name], MD as Medical Oncologist (Medical Oncology) Therese Sarah, RN as Oncology Nurse Navigator (Medical Oncology)   ASSESSMENT & PLAN:   Assessment: 1. Stage IIIb (T3N1B) descending colon cancer: - Presentation with abdominal pain for few weeks to the ER.  History of IBS and had colonoscopy more than 10 years ago.  No weight loss. - CT CAP with contrast (04/12/2021): Obstructive 5 cm distal descending colon neoplasm with associated proximal stercoral colitis with no bowel perforation.  2 indeterminate right upper lobe lung nodules 1 calcified and 1 noncalcified measuring up to 5 mm.  2 approximately 5 mm hyperdense foci along the gallbladder lumen which may represent gallbladder polyp or stone. - Sigmoidoscopy (04/13/2021) fungating infiltrative obstructing large mass found in the descending colon. - Left hemicolectomy (04/15/2021): Moderately differentiated adenocarcinoma, grade 2, 2/35 lymph nodes positive, margins negative, PT3PN1B.  MMR preserved. - Preoperative CEA (04/12/2021): 2.5. - Adjuvant chemotherapy with 3 months of CapeOx was recommended with close monitoring of right lung nodules on subsequent scans. - 4 cycles of CapeOx from 05/20/2021 through 07/22/2021 - Germline mutation testing: Negative   2. Social/family history: - She lives at home with her husband.  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  No chemical exposure.  Quit smoking at age 61. - Maternal aunt had brain tumor.  Mother had breast cancer.  Maternal aunt died of colon cancer in her 32s.  Maternal first cousin died of colon cancer in his 30s.  Maternal uncle had stomach cancer.  Paternal aunt had breast cancer.  Maternal grandfather had lung cancer.    Plan: 1. Stage IIIb  (T3N1B) descending colon cancer, MMR stable: - No change in bowel habits.  No bleeding per rectum or melena. - Reviewed labs from 12/02/2022: Normal LFTs.  CBC was grossly normal.  CEA was 2.0. - CTAP on 12/02/2022: No evidence of recurrence or metastatic disease.  Hepatic steatosis present. - She is having discomfort at the port site.  She may have her port discontinued by contacting Dr. Lovell Sheehan office. - RTC 3 months for follow-up with repeat CEA and Signatera.  2.  Diarrhea: - Continue fiber supplements daily to aid normal stool consistency.  3.  Hypertension: - She had a reaction to lisinopril with lip swelling.  She stopped taking amlodipine also last week.  Blood pressure today is high.  Recommend restarting amlodipine 10 mg daily.  As per patient's request, we will make a referral to [facility].    No orders of the defined types were placed in this encounter.     Alben Deeds Teague,acting as a Neurosurgeon for [name], MD.,have documented all relevant documentation on the behalf of [name], MD,as directed by  [name], MD while in the presence of [name], MD.  ***    [place] R Teague   3/17/20252:37 PM  CHIEF COMPLAINT:   Diagnosis: descending colon cancer    Cancer Staging  Colon cancer [facility]) Staging form: Colon and Rectum, AJCC 8th Edition - Clinical stage from 05/09/2021: Stage IIIB (cT3, cN1b, cM0) - Unsigned    Prior Therapy: 1. Left hemicolectomy (04/15/2021) 2.  4 cycles of CapeOx completed on 07/22/2021  Current Therapy:  surveillance   HISTORY OF PRESENT ILLNESS:   Oncology History  Colon cancer (HCC)  05/09/2021 Initial Diagnosis   Colon cancer (HCC)   05/20/2021 - 07/22/2021 Chemotherapy   Patient is on Treatment Plan : COLORECTAL Xelox (Capeox) q21d     07/17/2022 Genetic Testing   Negative genetic testing on the Multi-cancer + RNA panel.  The report date is July 17, 2022.  The Multi-Cancer + RNA  Panel offered by [facility] includes sequencing and/or deletion/duplication analysis of the following 70 genes:  AIP*, ALK, APC*, ATM*, AXIN2*, BAP1*, BARD1*, BLM*, BMPR1A*, BRCA1*, BRCA2*, BRIP1*, CDC73*, CDH1*, CDK4, CDKN1B*, CDKN2A, CHEK2*, CTNNA1*, DICER1*, EPCAM (del/dup only), EGFR, FH*, FLCN*, GREM1 (promoter dup only), HOXB13, KIT, LZTR1, MAX*, MBD4, MEN1*, MET, MITF, MLH1*, MSH2*, MSH3*, MSH6*, MUTYH*, NF1*, NF2*, NTHL1*, PALB2*, PDGFRA, PMS2*, POLD1*, POLE*, POT1*, PRKAR1A*, PTCH1*, PTEN*, RAD51C*, RAD51D*, RB1*, RET, SDHA* (sequencing only), SDHAF2*, SDHB*, SDHC*, SDHD*, SMAD4*, SMARCA4*, SMARCB1*, SMARCE1*, STK11*, SUFU*, TMEM127*, TP53*, TSC1*, TSC2*, VHL*. RNA analysis is performed for * genes.      INTERVAL HISTORY:   Alison Ramsey is a 53 y.o. female presenting to clinic today for follow up of descending colon cancer. She was last seen by me on 12/09/22.  Since her last visit, she had her port removed on 12/29/22. She was seen in the ED on 01/07/23 for anaphylaxis that improved with self administered Epi.   Today, she states that she is doing well overall. Her appetite level is at ***%. Her energy level is at ***%.    PAST MEDICAL HISTORY:   Past Medical History: Past Medical History:  Diagnosis Date   Angio-edema    Asthma    Cancer (HCC)    Colon cancer (HCC)    Eczema    Family history of breast cancer    Family history of colon cancer    Family history of stomach cancer    History of kidney stones    Hypertension    Lyme disease 2018   Urticaria     Surgical History: Past Surgical History:  Procedure Laterality Date   ABDOMINAL HYSTERECTOMY  2013   APPENDECTOMY     BIOPSY  04/13/2021   Procedure: BIOPSY;  Surgeon: Lanelle Bal, DO;  [facility];  Service: Endoscopy;;  descending colon mass   BIOPSY  01/24/2022   Procedure: BIOPSY;  Surgeon: Lanelle Bal, DO;  [facility];  Service: Endoscopy;;   COLONOSCOPY WITH PROPOFOL N/A  01/24/2022   Procedure: COLONOSCOPY WITH PROPOFOL;  Surgeon: Lanelle Bal, DO;  [facility];  Service: Endoscopy;  Laterality: N/A;  1:00 pm   COLONOSCOPY WITH PROPOFOL N/A 08/15/2022   Procedure: COLONOSCOPY WITH PROPOFOL;  Surgeon: Lanelle Bal, DO;  [facility];  Service: Endoscopy;  Laterality: N/A;  11:30AM;ASA 2   FLEXIBLE SIGMOIDOSCOPY N/A 04/13/2021   Procedure: FLEXIBLE SIGMOIDOSCOPY;  Surgeon: Lanelle Bal, DO;  [facility];  Service: Endoscopy;  Laterality: N/A;   HEMOSTASIS CLIP PLACEMENT  01/24/2022   Procedure: HEMOSTASIS CLIP PLACEMENT;  Surgeon: Lanelle Bal, DO;  [facility];  Service: Endoscopy;;   PARTIAL COLECTOMY N/A 04/15/2021   Procedure: EXTENDED RIGHT HEMICOLECTOMY;  Surgeon: [name], MD;  Location: [facility];  Service: General;  Laterality: N/A;   POLYPECTOMY  01/24/2022   Procedure: POLYPECTOMY;  Surgeon: Lanelle Bal, DO;  [facility];  Service: Endoscopy;;   POLYPECTOMY  08/15/2022   Procedure: POLYPECTOMY;  Surgeon: Lanelle Bal, DO;  [facility];  Service: Endoscopy;;  PORT-A-CATH REMOVAL N/A 12/29/2022   Procedure: MINOR REMOVAL PORT-A-CATH;  Surgeon: [name], MD;  Location: [facility];  Service: General;  Laterality: N/A;   PORTACATH PLACEMENT Left 05/13/2021   Procedure: INSERTION PORT-A-CATH;  Surgeon: [name], MD;  Location: [facility];  Service: General;  Laterality: Left;   SUBMUCOSAL LIFTING INJECTION  01/24/2022   Procedure: SUBMUCOSAL LIFTING INJECTION;  Surgeon: Lanelle Bal, DO;  [facility];  Service: Endoscopy;;   WISDOM TOOTH EXTRACTION      Social History: Social History   Socioeconomic History   Marital status: Married    Spouse name: Not on file   Number of children: Not on file   Years of education: Not on file   Highest education level: Associate degree: occupational, Scientist, product/process development, or vocational program  Occupational  History   Not on file  Tobacco Use   Smoking status: Former    Types: Cigarettes    Passive exposure: Past   Smokeless tobacco: Never  Vaping Use   Vaping status: Never Used  Substance and Sexual Activity   Alcohol use: Yes    Comment: occasionally   Drug use: Yes    Types: Marijuana   Sexual activity: Yes  Other Topics Concern   Not on file  Social History Narrative   Not on file   Social Drivers of Health   Financial Resource Strain: Low Risk  (02/10/2023)   Overall Financial Resource Strain (CARDIA)    Difficulty of Paying Living Expenses: Not very hard  Food Insecurity: No Food Insecurity (02/10/2023)   Hunger Vital Sign    Worried About Running Out of Food in the Last Year: Never true    Ran Out of Food in the Last Year: Never true  Transportation Needs: No Transportation Needs (02/10/2023)   PRAPARE - Administrator, Civil Service (Medical): No    Lack of Transportation (Non-Medical): No  Physical Activity: Sufficiently Active (02/10/2023)   Exercise Vital Sign    Days of Exercise per Week: 7 days    Minutes of Exercise per Session: 30 min  Stress: Patient Declined (02/10/2023)   [employer] of Occupational Health - Occupational Stress Questionnaire    Feeling of Stress : Patient declined  Social Connections: Moderately Integrated (02/10/2023)   Social Connection and Isolation Panel [NHANES]    Frequency of Communication with Friends and Family: More than three times a week    Frequency of Social Gatherings with Friends and Family: More than three times a week    Attends Religious Services: More than 4 times per year    Active Member of [employer] or Organizations: No    Attends Engineer, structural: Not on file    Marital Status: Married  Catering manager Violence: Not on file    Family History: Family History  Problem Relation Age of Onset   Kidney disease Mother    Hypertension Mother    Breast cancer Mother 70 - 68   Stroke Father    Colon  cancer Maternal Aunt 49       d. 40   Other Maternal Aunt 19       pituitary cancer   Breast cancer Maternal Aunt 12   Stomach cancer Maternal Uncle 35   Breast cancer Paternal Aunt    Lung cancer Maternal Grandfather    Colon cancer Cousin        d. 30s; maternal first cousin    Current Medications:  Current Outpatient Medications:  acidophilus (RISAQUAD) CAPS capsule, Take 1 capsule by mouth daily., Disp: , Rfl:    albuterol (VENTOLIN HFA) 108 (90 Base) MCG/ACT inhaler, Inhale 2 puffs into the lungs every 6 (six) hours as needed for wheezing or shortness of breath., Disp: 8 g, Rfl: 0   amLODipine (NORVASC) 5 MG tablet, Take 1 tablet (5 mg total) by mouth daily., Disp: 90 tablet, Rfl: 0   aspirin EC 81 MG tablet, Take 81 mg by mouth daily. Swallow whole., Disp: , Rfl:    benzonatate (TESSALON) 100 MG capsule, Take 1 capsule (100 mg total) by mouth 3 (three) times daily as needed for cough., Disp: 30 capsule, Rfl: 0   cetirizine (ZYRTEC) 10 MG tablet, Take 10 mg by mouth daily., Disp: , Rfl:    Lactobacillus (ACIDOPHILUS PO), Take 1 capsule by mouth at bedtime., Disp: , Rfl:    loratadine (CLARITIN) 10 MG tablet, Take 10 mg by mouth daily., Disp: , Rfl:    MAGNESIUM PO, Take 400 mg by mouth every evening., Disp: , Rfl:    metoprolol tartrate (LOPRESSOR) 25 MG tablet, Take 1 tablet (25 mg total) by mouth 2 (two) times daily., Disp: 180 tablet, Rfl: 0   spironolactone (ALDACTONE) 25 MG tablet, Take 25 mg by mouth 2 (two) times daily., Disp: , Rfl:    VITAMIN D PO, Take 1,000 Units by mouth at bedtime., Disp: , Rfl:    Allergies: Allergies  Allergen Reactions   Aleve [Naproxen] Hives    Ok with ibuprofen   Hydrochlorothiazide Hives   Lisinopril Hives, Itching and Swelling   Prednisolone Hives   Codeine Hives, Nausea And Vomiting and Rash   Latex Rash    REVIEW OF SYSTEMS:   Review of Systems  Constitutional:  Negative for chills, fatigue and fever.  HENT:   Negative for  lump/mass, mouth sores, nosebleeds, sore throat and trouble swallowing.   Eyes:  Negative for eye problems.  Respiratory:  Negative for cough and shortness of breath.   Cardiovascular:  Negative for chest pain, leg swelling and palpitations.  Gastrointestinal:  Negative for abdominal pain, constipation, diarrhea, nausea and vomiting.  Genitourinary:  Negative for bladder incontinence, difficulty urinating, dysuria, frequency, hematuria and nocturia.   Musculoskeletal:  Negative for arthralgias, back pain, flank pain, myalgias and neck pain.  Skin:  Negative for itching and rash.  Neurological:  Negative for dizziness, headaches and numbness.  Hematological:  Does not bruise/bleed easily.  Psychiatric/Behavioral:  Negative for depression, sleep disturbance and suicidal ideas. The patient is not nervous/anxious.   All other systems reviewed and are negative.    VITALS:   There were no vitals taken for this visit.  Wt Readings from Last 3 Encounters:  02/18/23 241 lb 12.8 oz (109.7 kg)  02/11/23 241 lb (109.3 kg)  01/07/23 235 lb (106.6 kg)    There is no height or weight on file to calculate BMI.  Performance status (ECOG): 0 - Asymptomatic  PHYSICAL EXAM:   Physical Exam Vitals and nursing note reviewed. Exam conducted with a chaperone present.  Constitutional:      Appearance: Normal appearance.  Cardiovascular:     Rate and Rhythm: Normal rate and regular rhythm.     Pulses: Normal pulses.     Heart sounds: Normal heart sounds.  Pulmonary:     Effort: Pulmonary effort is normal.     Breath sounds: Normal breath sounds.  Abdominal:     Palpations: Abdomen is soft. There is no hepatomegaly, splenomegaly or mass.  Tenderness: There is no abdominal tenderness.  Musculoskeletal:     Right lower leg: No edema.     Left lower leg: No edema.  Lymphadenopathy:     Cervical: No cervical adenopathy.     Right cervical: No superficial, deep or posterior cervical adenopathy.     Left cervical: No superficial, deep or posterior cervical adenopathy.     Upper Body:     Right upper body: No supraclavicular or axillary adenopathy.     Left upper body: No supraclavicular or axillary adenopathy.  Neurological:     General: No focal deficit present.     Mental Status: She is alert and oriented to person, place, and time.  Psychiatric:        Mood and Affect: Mood normal.        Behavior: Behavior normal.     LABS:      Latest Ref Rng & Units 03/10/2023    2:13 PM 12/02/2022    1:50 PM 08/26/2022    2:18 PM  CBC  WBC 4.0 - 10.5 K/uL 9.3  10.3  9.3   Hemoglobin 12.0 - 15.0 g/dL 16.1  09.6  04.5   Hematocrit 36.0 - 46.0 % 39.2  38.6  39.7   Platelets 150 - 400 K/uL 244  244  243       Latest Ref Rng & Units 03/10/2023    2:13 PM 02/18/2023   12:04 PM 12/02/2022    1:50 PM  CMP  Glucose 70 - 99 mg/dL 409  811  914   BUN 6 - 20 mg/dL 11  16  12    Creatinine 0.44 - 1.00 mg/dL 7.82  9.56  2.13   Sodium 135 - 145 mmol/L 133  138  133   Potassium 3.5 - 5.1 mmol/L 3.9  4.3  3.6   Chloride 98 - 111 mmol/L 99  100  101   CO2 22 - 32 mmol/L 26  22  24    Calcium 8.9 - 10.3 mg/dL 9.3  9.6  8.7   Total Protein 6.5 - 8.1 g/dL 7.1  7.0  7.3   Total Bilirubin 0.0 - 1.2 mg/dL 0.9  0.6  1.2   Alkaline Phos 38 - 126 U/L 88  129  80   AST 15 - 41 U/L 28  27  21    ALT 0 - 44 U/L 40  38  32      Lab Results  Component Value Date   CEA1 2.4 03/10/2023   /  CEA  Date Value Ref Range Status  03/10/2023 2.4 0.0 - 4.7 ng/mL Final    Comment:    (NOTE)                             Nonsmokers          <3.9                             Smokers             <5.6 Roche [employer] Electrochemiluminescence Immunoassay (ECLIA) Values obtained with different assay methods or kits cannot be used interchangeably.  Results cannot be interpreted as absolute evidence of the presence or absence of malignant disease. Performed At: [facility] [address] [place], [place]  [id number] Jolene Schimke MD GE:[id number]    No results found for: "PSA1" No results found for: "WNU272" Lab  Results  Component Value Date   CAN125 10.2 04/12/2021    No results found for: "TOTALPROTELP", "ALBUMINELP", "A1GS", "A2GS", "BETS", "BETA2SER", "GAMS", "MSPIKE", "SPEI" Lab Results  Component Value Date   TIBC 395 08/26/2022   TIBC 358 10/22/2021   TIBC 464 (H) 07/22/2021   FERRITIN 52 08/26/2022   FERRITIN 47 10/22/2021   FERRITIN 24 07/22/2021   IRONPCTSAT 19 08/26/2022   IRONPCTSAT 19 10/22/2021   IRONPCTSAT 15 07/22/2021   No results found for: "LDH"   STUDIES:   No results found.

## 2023-03-24 ENCOUNTER — Inpatient Hospital Stay: Payer: Self-pay | Admitting: Hematology

## 2023-03-29 NOTE — Progress Notes (Signed)
 [facility] [address], [place] [address]    Clinic Day:  03/30/2023  Referring physician: Junie Spencer, FNP  Patient Care Team: Evern Bio, Dois Davenport, NP as PCP - General (Nurse Practitioner) [name], MD as Medical Oncologist (Medical Oncology) Therese Sarah, RN as Oncology Nurse Navigator (Medical Oncology)   ASSESSMENT & PLAN:   Assessment: 1. Stage IIIb (T3N1B) descending colon cancer: - Presentation with abdominal pain for few weeks to the ER.  History of IBS and had colonoscopy more than 10 years ago.  No weight loss. - CT CAP with contrast (04/12/2021): Obstructive 5 cm distal descending colon neoplasm with associated proximal stercoral colitis with no bowel perforation.  2 indeterminate right upper lobe lung nodules 1 calcified and 1 noncalcified measuring up to 5 mm.  2 approximately 5 mm hyperdense foci along the gallbladder lumen which may represent gallbladder polyp or stone. - Sigmoidoscopy (04/13/2021) fungating infiltrative obstructing large mass found in the descending colon. - Left hemicolectomy (04/15/2021): Moderately differentiated adenocarcinoma, grade 2, 2/35 lymph nodes positive, margins negative, PT3PN1B.  MMR preserved. - Preoperative CEA (04/12/2021): 2.5. - Adjuvant chemotherapy with 3 months of CapeOx was recommended with close monitoring of right lung nodules on subsequent scans. - 4 cycles of CapeOx from 05/20/2021 through 07/22/2021 - Germline mutation testing: Negative   2. Social/family history: - She lives at home with her husband.  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  No chemical exposure.  Quit smoking at age 67. - Maternal aunt had brain tumor.  Mother had breast cancer.  Maternal aunt died of colon cancer in her 27s.  Maternal first cousin died of colon cancer in his 30s.  Maternal uncle had stomach cancer.  Paternal aunt had breast cancer.  Maternal grandfather had lung cancer.    Plan: 1. Stage IIIb  (T3N1B) descending colon cancer, MMR stable: - Denies any change in bowel habits.  Denies any bleeding per rectum or melena. - Labs today: Normal LFTs and creatinine.  CBC was normal.  CEA was normal at 2.4.  Signatera test was negative. - Recommend follow-up in 3 months with repeat CTAP with contrast.  Will also repeat CEA and LFTs.  Will repeat Signatera in 6 months.  2.  Diarrhea: - She is not taking fiber supplements.  If she takes fiber supplements, stool is more solid.  3.  Hypertension: - She is not on any antihypertensives due to reactions with ACE inhibitors.  Amlodipine is also on hold.  Blood pressure today is high.  She is seeing cardiology for further management.    Orders Placed This Encounter  Procedures   CT ABDOMEN PELVIS W CONTRAST    Standing Status:   Future    Expected Date:   06/30/2023    Expiration Date:   03/29/2024    If indicated for the ordered procedure, I authorize the administration of contrast media per Radiology protocol:   Yes    Does the patient have a contrast media/X-ray dye allergy?:   No    Preferred imaging location?:   [facility]    If indicated for the ordered procedure, I authorize the administration of oral contrast media per Radiology protocol:   Yes   CBC with Differential    Standing Status:   Future    Expected Date:   06/30/2023    Expiration Date:   03/29/2024   Comprehensive metabolic panel    Standing Status:   Future  Expected Date:   06/30/2023    Expiration Date:   03/29/2024   CEA    Standing Status:   Future    Expected Date:   06/30/2023    Expiration Date:   03/29/2024      I,Katie Daubenspeck,acting as a scribe for [name], MD.,have documented all relevant documentation on the behalf of [name], MD,as directed by  [name], MD while in the presence of [name], MD.   I, [name] MD, have reviewed the above documentation for accuracy and completeness, and I  agree with the above.   [name], MD   3/24/202512:48 PM  CHIEF COMPLAINT:   Diagnosis: descending colon cancer    Cancer Staging  Colon cancer [facility]) Staging form: Colon and Rectum, AJCC 8th Edition - Clinical stage from 05/09/2021: Stage IIIB (cT3, cN1b, cM0) - Unsigned    Prior Therapy: 1. Left hemicolectomy (04/15/2021) 2.  4 cycles of CapeOx completed on 07/22/2021  Current Therapy:  surveillance    HISTORY OF PRESENT ILLNESS:   Oncology History  Colon cancer (HCC)  05/09/2021 Initial Diagnosis   Colon cancer (HCC)   05/20/2021 - 07/22/2021 Chemotherapy   Patient is on Treatment Plan : COLORECTAL Xelox (Capeox) q21d     07/17/2022 Genetic Testing   Negative genetic testing on the Multi-cancer + RNA panel.  The report date is July 17, 2022.  The Multi-Cancer + RNA Panel offered by [facility] includes sequencing and/or deletion/duplication analysis of the following 70 genes:  AIP*, ALK, APC*, ATM*, AXIN2*, BAP1*, BARD1*, BLM*, BMPR1A*, BRCA1*, BRCA2*, BRIP1*, CDC73*, CDH1*, CDK4, CDKN1B*, CDKN2A, CHEK2*, CTNNA1*, DICER1*, EPCAM (del/dup only), EGFR, FH*, FLCN*, GREM1 (promoter dup only), HOXB13, KIT, LZTR1, MAX*, MBD4, MEN1*, MET, MITF, MLH1*, MSH2*, MSH3*, MSH6*, MUTYH*, NF1*, NF2*, NTHL1*, PALB2*, PDGFRA, PMS2*, POLD1*, POLE*, POT1*, PRKAR1A*, PTCH1*, PTEN*, RAD51C*, RAD51D*, RB1*, RET, SDHA* (sequencing only), SDHAF2*, SDHB*, SDHC*, SDHD*, SMAD4*, SMARCA4*, SMARCB1*, SMARCE1*, STK11*, SUFU*, TMEM127*, TP53*, TSC1*, TSC2*, VHL*. RNA analysis is performed for * genes.      INTERVAL HISTORY:   Alison Ramsey is a 53 y.o. female presenting to clinic today for follow up of descending colon cancer. She was last seen by me on 12/09/22.  Today, she states that she is doing well overall. Her appetite level is at 100%. Her energy level is at 25%.  PAST MEDICAL HISTORY:   Past Medical History: Past Medical History:  Diagnosis Date   Angio-edema    Asthma    Cancer (HCC)     Colon cancer (HCC)    Eczema    Family history of breast cancer    Family history of colon cancer    Family history of stomach cancer    History of kidney stones    Hypertension    Lyme disease 2018   Urticaria     Surgical History: Past Surgical History:  Procedure Laterality Date   ABDOMINAL HYSTERECTOMY  2013   APPENDECTOMY     BIOPSY  04/13/2021   Procedure: BIOPSY;  Surgeon: Lanelle Bal, DO;  [facility];  Service: Endoscopy;;  descending colon mass   BIOPSY  01/24/2022   Procedure: BIOPSY;  Surgeon: Lanelle Bal, DO;  [facility];  Service: Endoscopy;;   COLONOSCOPY WITH PROPOFOL N/A 01/24/2022   Procedure: COLONOSCOPY WITH PROPOFOL;  Surgeon: Lanelle Bal, DO;  [facility];  Service: Endoscopy;  Laterality: N/A;  1:00 pm   COLONOSCOPY WITH PROPOFOL N/A 08/15/2022   Procedure: COLONOSCOPY WITH  PROPOFOL;  Surgeon: Lanelle Bal, DO;  [facility];  Service: Endoscopy;  Laterality: N/A;  11:30AM;ASA 2   FLEXIBLE SIGMOIDOSCOPY N/A 04/13/2021   Procedure: FLEXIBLE SIGMOIDOSCOPY;  Surgeon: Lanelle Bal, DO;  [facility];  Service: Endoscopy;  Laterality: N/A;   HEMOSTASIS CLIP PLACEMENT  01/24/2022   Procedure: HEMOSTASIS CLIP PLACEMENT;  Surgeon: Lanelle Bal, DO;  [facility];  Service: Endoscopy;;   PARTIAL COLECTOMY N/A 04/15/2021   Procedure: EXTENDED RIGHT HEMICOLECTOMY;  Surgeon: [name], MD;  Location: [facility];  Service: General;  Laterality: N/A;   POLYPECTOMY  01/24/2022   Procedure: POLYPECTOMY;  Surgeon: Lanelle Bal, DO;  [facility];  Service: Endoscopy;;   POLYPECTOMY  08/15/2022   Procedure: POLYPECTOMY;  Surgeon: Lanelle Bal, DO;  [facility];  Service: Endoscopy;;   PORT-A-CATH REMOVAL N/A 12/29/2022   Procedure: MINOR REMOVAL PORT-A-CATH;  Surgeon: [name], MD;  Location: [facility];  Service: General;  Laterality: N/A;    PORTACATH PLACEMENT Left 05/13/2021   Procedure: INSERTION PORT-A-CATH;  Surgeon: [name], MD;  Location: [facility];  Service: General;  Laterality: Left;   SUBMUCOSAL LIFTING INJECTION  01/24/2022   Procedure: SUBMUCOSAL LIFTING INJECTION;  Surgeon: Lanelle Bal, DO;  [facility];  Service: Endoscopy;;   WISDOM TOOTH EXTRACTION      Social History: Social History   Socioeconomic History   Marital status: Married    Spouse name: Not on file   Number of children: Not on file   Years of education: Not on file   Highest education level: Associate degree: occupational, Scientist, product/process development, or vocational program  Occupational History   Not on file  Tobacco Use   Smoking status: Former    Types: Cigarettes    Passive exposure: Past   Smokeless tobacco: Never  Vaping Use   Vaping status: Never Used  Substance and Sexual Activity   Alcohol use: Yes    Comment: occasionally   Drug use: Yes    Types: Marijuana   Sexual activity: Yes  Other Topics Concern   Not on file  Social History Narrative   Not on file   Social Drivers of Health   Financial Resource Strain: Low Risk  (02/10/2023)   Overall Financial Resource Strain (CARDIA)    Difficulty of Paying Living Expenses: Not very hard  Food Insecurity: No Food Insecurity (02/10/2023)   Hunger Vital Sign    Worried About Running Out of Food in the Last Year: Never true    Ran Out of Food in the Last Year: Never true  Transportation Needs: No Transportation Needs (02/10/2023)   PRAPARE - Administrator, Civil Service (Medical): No    Lack of Transportation (Non-Medical): No  Physical Activity: Sufficiently Active (02/10/2023)   Exercise Vital Sign    Days of Exercise per Week: 7 days    Minutes of Exercise per Session: 30 min  Stress: Patient Declined (02/10/2023)   [employer] of Occupational Health - Occupational Stress Questionnaire    Feeling of Stress : Patient declined  Social Connections: Moderately  Integrated (02/10/2023)   Social Connection and Isolation Panel [NHANES]    Frequency of Communication with Friends and Family: More than three times a week    Frequency of Social Gatherings with Friends and Family: More than three times a week    Attends Religious Services: More than 4 times per year    Active Member  of Clubs or Organizations: No    Attends Engineer, structural: Not on file    Marital Status: Married  Catering manager Violence: Not on file    Family History: Family History  Problem Relation Age of Onset   Kidney disease Mother    Hypertension Mother    Breast cancer Mother 64 - 34   Stroke Father    Colon cancer Maternal Aunt 49       d. 70   Other Maternal Aunt 77       pituitary cancer   Breast cancer Maternal Aunt 12   Stomach cancer Maternal Uncle 73   Breast cancer Paternal Aunt    Lung cancer Maternal Grandfather    Colon cancer Cousin        d. 30s; maternal first cousin    Current Medications:  Current Outpatient Medications:    acidophilus (RISAQUAD) CAPS capsule, Take 1 capsule by mouth daily., Disp: , Rfl:    albuterol (VENTOLIN HFA) 108 (90 Base) MCG/ACT inhaler, Inhale 2 puffs into the lungs every 6 (six) hours as needed for wheezing or shortness of breath., Disp: 8 g, Rfl: 0   amLODipine (NORVASC) 5 MG tablet, Take 1 tablet (5 mg total) by mouth daily., Disp: 90 tablet, Rfl: 0   aspirin EC 81 MG tablet, Take 81 mg by mouth daily. Swallow whole., Disp: , Rfl:    benzonatate (TESSALON) 100 MG capsule, Take 1 capsule (100 mg total) by mouth 3 (three) times daily as needed for cough., Disp: 30 capsule, Rfl: 0   cetirizine (ZYRTEC) 10 MG tablet, Take 10 mg by mouth daily., Disp: , Rfl:    Lactobacillus (ACIDOPHILUS PO), Take 1 capsule by mouth at bedtime., Disp: , Rfl:    loratadine (CLARITIN) 10 MG tablet, Take 10 mg by mouth daily., Disp: , Rfl:    MAGNESIUM PO, Take 400 mg by mouth every evening., Disp: , Rfl:    metoprolol tartrate  (LOPRESSOR) 25 MG tablet, Take 1 tablet (25 mg total) by mouth 2 (two) times daily., Disp: 180 tablet, Rfl: 0   spironolactone (ALDACTONE) 25 MG tablet, Take 25 mg by mouth 2 (two) times daily., Disp: , Rfl:    VITAMIN D PO, Take 1,000 Units by mouth at bedtime., Disp: , Rfl:    Allergies: Allergies  Allergen Reactions   Aleve [Naproxen] Hives    Ok with ibuprofen   Hydrochlorothiazide Hives   Lisinopril Hives, Itching and Swelling   Prednisolone Hives   Codeine Hives, Nausea And Vomiting and Rash   Latex Rash    REVIEW OF SYSTEMS:   Review of Systems  Constitutional:  Negative for chills, fatigue and fever.  HENT:   Negative for lump/mass, mouth sores, nosebleeds, sore throat and trouble swallowing.   Eyes:  Negative for eye problems.  Respiratory:  Positive for shortness of breath. Negative for cough.   Cardiovascular:  Negative for chest pain, leg swelling and palpitations.  Gastrointestinal:  Negative for abdominal pain, constipation, diarrhea, nausea and vomiting.  Genitourinary:  Negative for bladder incontinence, difficulty urinating, dysuria, frequency, hematuria and nocturia.   Musculoskeletal:  Negative for arthralgias, back pain, flank pain, myalgias and neck pain.  Skin:  Negative for itching and rash.  Neurological:  Negative for dizziness, headaches and numbness.  Hematological:  Does not bruise/bleed easily.  Psychiatric/Behavioral:  Positive for sleep disturbance. Negative for depression and suicidal ideas. The patient is not nervous/anxious.   All other systems reviewed and are negative.  VITALS:   Blood pressure (!) 153/111, pulse 100, temperature 98 F (36.7 C), temperature source Oral, resp. rate 18, weight 240 lb 4.8 oz (109 kg), SpO2 100%.  Wt Readings from Last 3 Encounters:  03/30/23 240 lb 4.8 oz (109 kg)  02/18/23 241 lb 12.8 oz (109.7 kg)  02/11/23 241 lb (109.3 kg)    Body mass index is 38.79 kg/m.  Performance status (ECOG): 1 - Symptomatic  but completely ambulatory  PHYSICAL EXAM:   Physical Exam Vitals and nursing note reviewed. Exam conducted with a chaperone present.  Constitutional:      Appearance: Normal appearance.  Cardiovascular:     Rate and Rhythm: Normal rate and regular rhythm.     Pulses: Normal pulses.     Heart sounds: Normal heart sounds.  Pulmonary:     Effort: Pulmonary effort is normal.     Breath sounds: Normal breath sounds.  Abdominal:     Palpations: Abdomen is soft. There is no hepatomegaly, splenomegaly or mass.     Tenderness: There is no abdominal tenderness.  Musculoskeletal:     Right lower leg: No edema.     Left lower leg: No edema.  Lymphadenopathy:     Cervical: No cervical adenopathy.     Right cervical: No superficial, deep or posterior cervical adenopathy.    Left cervical: No superficial, deep or posterior cervical adenopathy.     Upper Body:     Right upper body: No supraclavicular or axillary adenopathy.     Left upper body: No supraclavicular or axillary adenopathy.  Neurological:     General: No focal deficit present.     Mental Status: She is alert and oriented to person, place, and time.  Psychiatric:        Mood and Affect: Mood normal.        Behavior: Behavior normal.     LABS:   CBC     Component Value Date/Time   WBC 9.3 03/10/2023 1413   RBC 4.81 03/10/2023 1413   HGB 13.4 03/10/2023 1413   HGB 14.2 07/17/2016 1118   HCT 39.2 03/10/2023 1413   HCT 40.6 07/17/2016 1118   PLT 244 03/10/2023 1413   PLT 262 07/17/2016 1118   MCV 81.5 03/10/2023 1413   MCV 85 07/17/2016 1118   MCH 27.9 03/10/2023 1413   MCHC 34.2 03/10/2023 1413   RDW 14.1 03/10/2023 1413   RDW 13.9 07/17/2016 1118   LYMPHSABS 2.3 03/10/2023 1413   LYMPHSABS 1.6 07/17/2016 1118   MONOABS 0.6 03/10/2023 1413   EOSABS 0.1 03/10/2023 1413   EOSABS 0.1 07/17/2016 1118   BASOSABS 0.1 03/10/2023 1413   BASOSABS 0.0 07/17/2016 1118    CMP      Component Value Date/Time   NA 133  (L) 03/10/2023 1413   NA 138 02/18/2023 1204   K 3.9 03/10/2023 1413   CL 99 03/10/2023 1413   CO2 26 03/10/2023 1413   GLUCOSE 174 (H) 03/10/2023 1413   BUN 11 03/10/2023 1413   BUN 16 02/18/2023 1204   CREATININE 0.77 03/10/2023 1413   CALCIUM 9.3 03/10/2023 1413   PROT 7.1 03/10/2023 1413   PROT 7.0 02/18/2023 1204   ALBUMIN 4.1 03/10/2023 1413   ALBUMIN 4.5 02/18/2023 1204   AST 28 03/10/2023 1413   ALT 40 03/10/2023 1413   ALKPHOS 88 03/10/2023 1413   BILITOT 0.9 03/10/2023 1413   BILITOT 0.6 02/18/2023 1204   GFRNONAA >60 03/10/2023 1413   GFRAA 68 05/19/2017  1605     Lab Results  Component Value Date   CEA1 2.4 03/10/2023   /  CEA  Date Value Ref Range Status  03/10/2023 2.4 0.0 - 4.7 ng/mL Final    Comment:    (NOTE)                             Nonsmokers          <3.9                             Smokers             <5.6 Roche [employer] Electrochemiluminescence Immunoassay (ECLIA) Values obtained with different assay methods or kits cannot be used interchangeably.  Results cannot be interpreted as absolute evidence of the presence or absence of malignant disease. Performed At: [facility] [address] [place], [place] [id number] Jolene Schimke MD YQ:[id number]    No results found for: "PSA1" No results found for: "CAN199" Lab Results  Component Value Date   CAN125 10.2 04/12/2021    No results found for: "TOTALPROTELP", "ALBUMINELP", "A1GS", "A2GS", "BETS", "BETA2SER", "GAMS", "MSPIKE", "SPEI" Lab Results  Component Value Date   TIBC 395 08/26/2022   TIBC 358 10/22/2021   TIBC 464 (H) 07/22/2021   FERRITIN 52 08/26/2022   FERRITIN 47 10/22/2021   FERRITIN 24 07/22/2021   IRONPCTSAT 19 08/26/2022   IRONPCTSAT 19 10/22/2021   IRONPCTSAT 15 07/22/2021   No results found for: "LDH"   STUDIES:   No results found.

## 2023-03-30 ENCOUNTER — Inpatient Hospital Stay (HOSPITAL_BASED_OUTPATIENT_CLINIC_OR_DEPARTMENT_OTHER): Payer: Self-pay | Admitting: Hematology

## 2023-03-30 ENCOUNTER — Encounter (HOSPITAL_COMMUNITY): Payer: Self-pay | Admitting: Hematology

## 2023-03-30 VITALS — BP 153/111 | HR 100 | Temp 98.0°F | Resp 18 | Wt 240.3 lb

## 2023-03-30 DIAGNOSIS — C186 Malignant neoplasm of descending colon: Secondary | ICD-10-CM

## 2023-03-30 NOTE — Patient Instructions (Addendum)
 [facility] at [facility] Discharge Instructions   You were seen and examined today by Dr. Ellin Saba.  He reviewed the results of your lab work which are normal/stable.   We will see you back in 3 months. We will repeat lab work and a CT scan prior to this visit.   Return as scheduled.    Thank you for choosing [facility] at [facility] to provide your oncology and hematology care.  To afford each patient quality time with our provider, please arrive at least 15 minutes before your scheduled appointment time.   If you have a lab appointment with the Cancer Center please come in thru the Main Entrance and check in at the main information desk.  You need to re-schedule your appointment should you arrive 10 or more minutes late.  We strive to give you quality time with our providers, and arriving late affects you and other patients whose appointments are after yours.  Also, if you no show three or more times for appointments you may be dismissed from the clinic at the providers discretion.     Again, thank you for choosing [facility].  Our hope is that these requests will decrease the amount of time that you wait before being seen by our physicians.       _____________________________________________________________  Should you have questions after your visit to [facility], please contact our office at [phone] and follow the prompts.  Our office hours are 8:00 a.m. and 4:30 p.m. Monday - Friday.  Please note that voicemails left after 4:00 p.m. may not be returned until the following business day.  We are closed weekends and major holidays.  You do have access to a nurse 24-7, just call the main number to the clinic [phone] and do not press any options, hold on the line and a nurse will answer the phone.    For prescription refill requests, have your pharmacy contact our office and allow 72 hours.    For  doctor visits, patients may have 1 support person age 53 or older with them. For treatment visits, patients can not have anyone with them due to social distancing guidelines and our immunocompromised population.

## 2023-05-20 ENCOUNTER — Ambulatory Visit: Payer: 59 | Admitting: Internal Medicine

## 2023-05-20 ENCOUNTER — Ambulatory Visit: Payer: 59 | Admitting: Allergy & Immunology

## 2023-05-20 DIAGNOSIS — J309 Allergic rhinitis, unspecified: Secondary | ICD-10-CM

## 2023-06-26 ENCOUNTER — Ambulatory Visit (HOSPITAL_COMMUNITY)
Admission: RE | Admit: 2023-06-26 | Discharge: 2023-06-26 | Disposition: A | Discharge status: Home / Self Care | Payer: MEDICAID | Source: Ambulatory Visit | Attending: Hematology | Admitting: Hematology

## 2023-06-26 ENCOUNTER — Inpatient Hospital Stay: Payer: Self-pay | Attending: Hematology

## 2023-06-26 DIAGNOSIS — C186 Malignant neoplasm of descending colon: Secondary | ICD-10-CM | POA: Insufficient documentation

## 2023-06-26 DIAGNOSIS — Z85038 Personal history of other malignant neoplasm of large intestine: Secondary | ICD-10-CM | POA: Insufficient documentation

## 2023-06-26 DIAGNOSIS — N1832 Chronic kidney disease, stage 3b: Secondary | ICD-10-CM | POA: Insufficient documentation

## 2023-06-26 LAB — COMPREHENSIVE METABOLIC PANEL WITH GFR
ALT: 30 U/L (ref 0–44)
AST: 23 U/L (ref 15–41)
Albumin: 3.8 g/dL (ref 3.5–5.0)
Alkaline Phosphatase: 93 U/L (ref 38–126)
Anion gap: 8 (ref 5–15)
BUN: 13 mg/dL (ref 6–20)
CO2: 25 mmol/L (ref 22–32)
Calcium: 8.9 mg/dL (ref 8.9–10.3)
Chloride: 104 mmol/L (ref 98–111)
Creatinine, Ser: 0.82 mg/dL (ref 0.44–1.00)
GFR, Estimated: >60 mL/min (ref >60)
Glucose, Bld: 245 mg/dL — ABNORMAL HIGH (ref 70–99)
Potassium: 3.8 mmol/L (ref 3.5–5.1)
Sodium: 137 mmol/L (ref 135–145)
Total Bilirubin: 0.9 mg/dL (ref 0.0–1.2)
Total Protein: 6.9 g/dL (ref 6.5–8.1)

## 2023-06-26 LAB — CBC WITH DIFFERENTIAL/PLATELET
Abs Immature Granulocytes: 0.03 10*3/uL (ref 0.00–0.07)
Basophils Absolute: 0 10*3/uL (ref 0.0–0.1)
Basophils Relative: 0 %
Eosinophils Absolute: 0.1 10*3/uL (ref 0.0–0.5)
Eosinophils Relative: 2 %
HCT: 37.9 % (ref 36.0–46.0)
Hemoglobin: 12.9 g/dL (ref 12.0–15.0)
Immature Granulocytes: 0 %
Lymphocytes Relative: 19 %
Lymphs Abs: 1.5 10*3/uL (ref 0.7–4.0)
MCH: 28.2 pg (ref 26.0–34.0)
MCHC: 34 g/dL (ref 30.0–36.0)
MCV: 82.8 fL (ref 80.0–100.0)
Monocytes Absolute: 0.5 10*3/uL (ref 0.1–1.0)
Monocytes Relative: 6 %
Neutro Abs: 5.9 10*3/uL (ref 1.7–7.7)
Neutrophils Relative %: 73 %
Platelets: 230 10*3/uL (ref 150–400)
RBC: 4.58 MIL/uL (ref 3.87–5.11)
RDW: 14.7 % (ref 11.5–15.5)
WBC: 8 10*3/uL (ref 4.0–10.5)
nRBC: 0 % (ref 0.0–0.2)

## 2023-06-26 MED ORDER — IOHEXOL 9 MG/ML PO SOLN
ORAL | Status: AC
  Filled 2023-06-26: qty 1000

## 2023-06-26 MED ORDER — BARIUM SULFATE 2 % PO SUSP
450.0000 mL | Freq: Once | ORAL | Status: AC
  Administered 2023-06-26: 450 mL via ORAL

## 2023-06-26 MED ORDER — IOHEXOL 9 MG/ML PO SOLN: 500.0000 mL | ORAL | Status: AC

## 2023-06-26 MED ORDER — IOHEXOL 300 MG/ML  SOLN
100.0000 mL | Freq: Once | INTRAMUSCULAR | Status: AC | PRN
  Administered 2023-06-26: 100 mL via INTRAVENOUS

## 2023-06-27 LAB — CEA: CEA: 2.3 ng/mL (ref 0.0–4.7)

## 2023-07-06 ENCOUNTER — Inpatient Hospital Stay: Payer: Self-pay | Admitting: Hematology

## 2023-07-06 NOTE — Progress Notes (Incomplete)
 [facility] [address], [place] [address]    Clinic Day:  07/06/2023  Referring physician: Deitra Morton Sebastian Thurmon*  Patient Care Team: [facility], [name], NP as PCP - General (Nurse Practitioner) Rogers Hai, MD as Medical Oncologist (Medical Oncology) Celestia Joesph SQUIBB, RN as Oncology Nurse Navigator (Medical Oncology)   ASSESSMENT & PLAN:   Assessment: 1. Stage IIIb (T3N1B) descending colon cancer: - Presentation with abdominal pain for few weeks to the ER.  History of IBS and had colonoscopy more than 10 years ago.  No weight loss. - CT CAP with contrast (04/12/2021): Obstructive 5 cm distal descending colon neoplasm with associated proximal stercoral colitis with no bowel perforation.  2 indeterminate right upper lobe lung nodules 1 calcified and 1 noncalcified measuring up to 5 mm.  2 approximately 5 mm hyperdense foci along the gallbladder lumen which may represent gallbladder polyp or stone. - Sigmoidoscopy (04/13/2021) fungating infiltrative obstructing large mass found in the descending colon. - Left hemicolectomy (04/15/2021): Moderately differentiated adenocarcinoma, grade 2, 2/35 lymph nodes positive, margins negative, PT3PN1B.  MMR preserved. - Preoperative CEA (04/12/2021): 2.5. - Adjuvant chemotherapy with 3 months of CapeOx was recommended with close monitoring of right lung nodules on subsequent scans. - 4 cycles of CapeOx from 05/20/2021 through 07/22/2021 - Germline mutation testing: Negative   2. Social/family history: - She lives at home with her husband.  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  No chemical exposure.  Quit smoking at age 88. - Maternal aunt had brain tumor.  Mother had breast cancer.  Maternal aunt died of colon cancer in her 51s.  Maternal first cousin died of colon cancer in his 30s.  Maternal uncle had stomach cancer.  Paternal aunt had breast cancer.  Maternal grandfather had lung cancer.    Plan: 1. Stage  IIIb (T3N1B) descending colon cancer, MMR stable: - Denies any change in bowel habits.  Denies any bleeding per rectum or melena. - Labs today: Normal LFTs and creatinine.  CBC was normal.  CEA was normal at 2.4.  Signatera test was negative. - Recommend follow-up in 3 months with repeat CTAP with contrast.  Will also repeat CEA and LFTs.  Will repeat Signatera in 6 months.  2.  Diarrhea: - She is not taking fiber supplements.  If she takes fiber supplements, stool is more solid.  3.  Hypertension: - She is not on any antihypertensives due to reactions with ACE inhibitors.  Amlodipine  is also on hold.  Blood pressure today is high.  She is seeing cardiology for further management.    No orders of the defined types were placed in this encounter.     LILLETTE Verneta SAUNDERS Teague,acting as a Neurosurgeon for Hai Rogers, MD.,have documented all relevant documentation on the behalf of Hai Rogers, MD,as directed by  Hai Rogers, MD while in the presence of Hai Rogers, MD.  ***   Dennison R Teague   6/30/20257:13 AM  CHIEF COMPLAINT:   Diagnosis: descending colon cancer    Cancer Staging  Colon cancer [facility]) Staging form: Colon and Rectum, AJCC 8th Edition - Clinical stage from 05/09/2021: Stage IIIB (cT3, cN1b, cM0) - Unsigned    Prior Therapy: 1. Left hemicolectomy (04/15/2021) 2.  4 cycles of CapeOx completed on 07/22/2021  Current Therapy:  surveillance    HISTORY OF PRESENT ILLNESS:   Oncology History  Colon cancer (HCC)  05/09/2021 Initial Diagnosis   Colon cancer (HCC)   05/20/2021 - 07/22/2021 Chemotherapy  Patient is on Treatment Plan : COLORECTAL Xelox (Capeox) q21d     07/17/2022 Genetic Testing   Negative genetic testing on the Multi-cancer + RNA panel.  The report date is July 17, 2022.  The Multi-Cancer + RNA Panel offered by [facility] includes sequencing and/or deletion/duplication analysis of the following 70 genes:  AIP*, ALK, APC*, ATM*, AXIN2*,  BAP1*, BARD1*, BLM*, BMPR1A*, BRCA1*, BRCA2*, BRIP1*, CDC73*, CDH1*, CDK4, CDKN1B*, CDKN2A, CHEK2*, CTNNA1*, DICER1*, EPCAM (del/dup only), EGFR, FH*, FLCN*, GREM1 (promoter dup only), HOXB13, KIT, LZTR1, MAX*, MBD4, MEN1*, MET, MITF, MLH1*, MSH2*, MSH3*, MSH6*, MUTYH*, NF1*, NF2*, NTHL1*, PALB2*, PDGFRA, PMS2*, POLD1*, POLE*, POT1*, PRKAR1A*, PTCH1*, PTEN*, RAD51C*, RAD51D*, RB1*, RET, SDHA* (sequencing only), SDHAF2*, SDHB*, SDHC*, SDHD*, SMAD4*, SMARCA4*, SMARCB1*, SMARCE1*, STK11*, SUFU*, TMEM127*, TP53*, TSC1*, TSC2*, VHL*. RNA analysis is performed for * genes.      INTERVAL HISTORY:   Lynett is a 53 y.o. female presenting to clinic today for follow up of descending colon cancer. She was last seen by me on 03/30/23.  Since her last visit, she underwent CT AP on 06/26/23.   Today, she states that she is doing well overall. Her appetite level is at ***%. Her energy level is at ***%.  PAST MEDICAL HISTORY:   Past Medical History: Past Medical History:  Diagnosis Date   Angio-edema    Asthma    Cancer (HCC)    Colon cancer (HCC)    Eczema    Family history of breast cancer    Family history of colon cancer    Family history of stomach cancer    History of kidney stones    Hypertension    Lyme disease 2018   Urticaria     Surgical History: Past Surgical History:  Procedure Laterality Date   ABDOMINAL HYSTERECTOMY  2013   APPENDECTOMY     BIOPSY  04/13/2021   Procedure: BIOPSY;  Surgeon: Cindie Carlin POUR, DO;  [facility];  Service: Endoscopy;;  descending colon mass   BIOPSY  01/24/2022   Procedure: BIOPSY;  Surgeon: Cindie Carlin POUR, DO;  [facility];  Service: Endoscopy;;   COLONOSCOPY WITH PROPOFOL  N/A 01/24/2022   Procedure: COLONOSCOPY WITH PROPOFOL ;  Surgeon: Cindie Carlin POUR, DO;  [facility];  Service: Endoscopy;  Laterality: N/A;  1:00 pm   COLONOSCOPY WITH PROPOFOL  N/A 08/15/2022   Procedure: COLONOSCOPY WITH PROPOFOL ;   Surgeon: Cindie Carlin POUR, DO;  [facility];  Service: Endoscopy;  Laterality: N/A;  11:30AM;ASA 2   FLEXIBLE SIGMOIDOSCOPY N/A 04/13/2021   Procedure: FLEXIBLE SIGMOIDOSCOPY;  Surgeon: Cindie Carlin POUR, DO;  [facility];  Service: Endoscopy;  Laterality: N/A;   HEMOSTASIS CLIP PLACEMENT  01/24/2022   Procedure: HEMOSTASIS CLIP PLACEMENT;  Surgeon: Cindie Carlin POUR, DO;  [facility];  Service: Endoscopy;;   PARTIAL COLECTOMY N/A 04/15/2021   Procedure: EXTENDED RIGHT HEMICOLECTOMY;  Surgeon: Mavis Anes, MD;  [facility]: [facility];  Service: General;  Laterality: N/A;   POLYPECTOMY  01/24/2022   Procedure: POLYPECTOMY;  Surgeon: Cindie Carlin POUR, DO;  [facility];  Service: Endoscopy;;   POLYPECTOMY  08/15/2022   Procedure: POLYPECTOMY;  Surgeon: Cindie Carlin POUR, DO;  [facility];  Service: Endoscopy;;   PORT-A-CATH REMOVAL N/A 12/29/2022   Procedure: MINOR REMOVAL PORT-A-CATH;  Surgeon: Mavis Anes, MD;  Location: [facility];  Service: General;  Laterality: N/A;   PORTACATH PLACEMENT Left 05/13/2021   Procedure: INSERTION PORT-A-CATH;  Surgeon: Mavis,  Oneil, MD;  Location: [facility];  Service: General;  Laterality: Left;   SUBMUCOSAL LIFTING INJECTION  01/24/2022   Procedure: SUBMUCOSAL LIFTING INJECTION;  Surgeon: Cindie Carlin POUR, DO;  [facility];  Service: Endoscopy;;   WISDOM TOOTH EXTRACTION      Social History: Social History   Socioeconomic History   Marital status: Married    Spouse name: Not on file   Number of children: Not on file   Years of education: Not on file   Highest education level: Associate degree: occupational, Scientist, product/process development, or vocational program  Occupational History   Not on file  Tobacco Use   Smoking status: Former    Types: Cigarettes    Passive exposure: Past   Smokeless tobacco: Never  Vaping Use   Vaping status: Never Used  Substance and Sexual Activity   Alcohol use: Yes     Comment: occasionally   Drug use: Yes    Types: Marijuana   Sexual activity: Yes  Other Topics Concern   Not on file  Social History Narrative   Not on file   Social Drivers of Health   Financial Resource Strain: Low Risk  (02/10/2023)   Overall Financial Resource Strain (CARDIA)    Difficulty of Paying Living Expenses: Not very hard  Food Insecurity: No Food Insecurity (02/10/2023)   Hunger Vital Sign    Worried About Running Out of Food in the Last Year: Never true    Ran Out of Food in the Last Year: Never true  Transportation Needs: No Transportation Needs (02/10/2023)   PRAPARE - Administrator, Civil Service (Medical): No    Lack of Transportation (Non-Medical): No  Physical Activity: Sufficiently Active (02/10/2023)   Exercise Vital Sign    Days of Exercise per Week: 7 days    Minutes of Exercise per Session: 30 min  Stress: Patient Declined (02/10/2023)   [employer] of Occupational Health - Occupational Stress Questionnaire    Feeling of Stress : Patient declined  Social Connections: Moderately Integrated (02/10/2023)   Social Connection and Isolation Panel    Frequency of Communication with Friends and Family: More than three times a week    Frequency of Social Gatherings with Friends and Family: More than three times a week    Attends Religious Services: More than 4 times per year    Active Member of [employer] or Organizations: No    Attends Engineer, structural: Not on file    Marital Status: Married  Catering manager Violence: Not on file    Family History: Family History  Problem Relation Age of Onset   Kidney disease Mother    Hypertension Mother    Breast cancer Mother 63 - 39   Stroke Father    Colon cancer Maternal Aunt 49       d. 65   Other Maternal Aunt 43       pituitary cancer   Breast cancer Maternal Aunt 15   Stomach cancer Maternal Uncle 83   Breast cancer Paternal Aunt    Lung cancer Maternal Grandfather    Colon cancer  Cousin        d. 30s; maternal first cousin    Current Medications:  Current Outpatient Medications:    acidophilus (RISAQUAD) CAPS capsule, Take 1 capsule by mouth daily., Disp: , Rfl:    albuterol  (VENTOLIN  HFA) 108 (90 Base) MCG/ACT inhaler, Inhale 2 puffs into the lungs every 6 (six) hours as needed for wheezing or  shortness of breath., Disp: 8 g, Rfl: 0   amLODipine  (NORVASC ) 5 MG tablet, Take 1 tablet (5 mg total) by mouth daily., Disp: 90 tablet, Rfl: 0   aspirin EC 81 MG tablet, Take 81 mg by mouth daily. Swallow whole., Disp: , Rfl:    benzonatate  (TESSALON ) 100 MG capsule, Take 1 capsule (100 mg total) by mouth 3 (three) times daily as needed for cough., Disp: 30 capsule, Rfl: 0   cetirizine (ZYRTEC) 10 MG tablet, Take 10 mg by mouth daily., Disp: , Rfl:    Lactobacillus (ACIDOPHILUS PO), Take 1 capsule by mouth at bedtime., Disp: , Rfl:    loratadine (CLARITIN) 10 MG tablet, Take 10 mg by mouth daily., Disp: , Rfl:    MAGNESIUM PO, Take 400 mg by mouth every evening., Disp: , Rfl:    metoprolol  tartrate (LOPRESSOR ) 25 MG tablet, Take 1 tablet (25 mg total) by mouth 2 (two) times daily., Disp: 180 tablet, Rfl: 0   spironolactone  (ALDACTONE ) 25 MG tablet, Take 25 mg by mouth 2 (two) times daily., Disp: , Rfl:    VITAMIN D  PO, Take 1,000 Units by mouth at bedtime., Disp: , Rfl:    Allergies: Allergies  Allergen Reactions   Aleve [Naproxen] Hives    Ok with ibuprofen   Hydrochlorothiazide  Hives   Lisinopril  Hives, Itching and Swelling   Prednisolone Hives   Codeine Hives, Nausea And Vomiting and Rash   Latex Rash    REVIEW OF SYSTEMS:   Review of Systems  Constitutional:  Negative for chills, fatigue and fever.  HENT:   Negative for lump/mass, mouth sores, nosebleeds, sore throat and trouble swallowing.   Eyes:  Negative for eye problems.  Respiratory:  Negative for cough and shortness of breath.   Cardiovascular:  Negative for chest pain, leg swelling and palpitations.   Gastrointestinal:  Negative for abdominal pain, constipation, diarrhea, nausea and vomiting.  Genitourinary:  Negative for bladder incontinence, difficulty urinating, dysuria, frequency, hematuria and nocturia.   Musculoskeletal:  Negative for arthralgias, back pain, flank pain, myalgias and neck pain.  Skin:  Negative for itching and rash.  Neurological:  Negative for dizziness, headaches and numbness.  Hematological:  Does not bruise/bleed easily.  Psychiatric/Behavioral:  Negative for depression, sleep disturbance and suicidal ideas. The patient is not nervous/anxious.   All other systems reviewed and are negative.    VITALS:   There were no vitals taken for this visit.  Wt Readings from Last 3 Encounters:  03/30/23 240 lb 4.8 oz (109 kg)  02/18/23 241 lb 12.8 oz (109.7 kg)  02/11/23 241 lb (109.3 kg)    There is no height or weight on file to calculate BMI.  Performance status (ECOG): 1 - Symptomatic but completely ambulatory  PHYSICAL EXAM:   Physical Exam Vitals and nursing note reviewed. Exam conducted with a chaperone present.  Constitutional:      Appearance: Normal appearance.   Cardiovascular:     Rate and Rhythm: Normal rate and regular rhythm.     Pulses: Normal pulses.     Heart sounds: Normal heart sounds.  Pulmonary:     Effort: Pulmonary effort is normal.     Breath sounds: Normal breath sounds.  Abdominal:     Palpations: Abdomen is soft. There is no hepatomegaly, splenomegaly or mass.     Tenderness: There is no abdominal tenderness.   Musculoskeletal:     Right lower leg: No edema.     Left lower leg: No edema.  Lymphadenopathy:  Cervical: No cervical adenopathy.     Right cervical: No superficial, deep or posterior cervical adenopathy.    Left cervical: No superficial, deep or posterior cervical adenopathy.     Upper Body:     Right upper body: No supraclavicular or axillary adenopathy.     Left upper body: No supraclavicular or axillary  adenopathy.   Neurological:     General: No focal deficit present.     Mental Status: She is alert and oriented to person, place, and time.   Psychiatric:        Mood and Affect: Mood normal.        Behavior: Behavior normal.     LABS:   CBC     Component Value Date/Time   WBC 8.0 06/26/2023 1235   RBC 4.58 06/26/2023 1235   HGB 12.9 06/26/2023 1235   HGB 14.2 07/17/2016 1118   HCT 37.9 06/26/2023 1235   HCT 40.6 07/17/2016 1118   PLT 230 06/26/2023 1235   PLT 262 07/17/2016 1118   MCV 82.8 06/26/2023 1235   MCV 85 07/17/2016 1118   MCH 28.2 06/26/2023 1235   MCHC 34.0 06/26/2023 1235   RDW 14.7 06/26/2023 1235   RDW 13.9 07/17/2016 1118   LYMPHSABS 1.5 06/26/2023 1235   LYMPHSABS 1.6 07/17/2016 1118   MONOABS 0.5 06/26/2023 1235   EOSABS 0.1 06/26/2023 1235   EOSABS 0.1 07/17/2016 1118   BASOSABS 0.0 06/26/2023 1235   BASOSABS 0.0 07/17/2016 1118    CMP      Component Value Date/Time   NA 137 06/26/2023 1235   NA 138 02/18/2023 1204   K 3.8 06/26/2023 1235   CL 104 06/26/2023 1235   CO2 25 06/26/2023 1235   GLUCOSE 245 (H) 06/26/2023 1235   BUN 13 06/26/2023 1235   BUN 16 02/18/2023 1204   CREATININE 0.82 06/26/2023 1235   CALCIUM 8.9 06/26/2023 1235   PROT 6.9 06/26/2023 1235   PROT 7.0 02/18/2023 1204   ALBUMIN 3.8 06/26/2023 1235   ALBUMIN 4.5 02/18/2023 1204   AST 23 06/26/2023 1235   ALT 30 06/26/2023 1235   ALKPHOS 93 06/26/2023 1235   BILITOT 0.9 06/26/2023 1235   BILITOT 0.6 02/18/2023 1204   GFRNONAA >60 06/26/2023 1235   GFRAA 68 05/19/2017 1605     Lab Results  Component Value Date   CEA1 2.3 06/26/2023   /  CEA  Date Value Ref Range Status  06/26/2023 2.3 0.0 - 4.7 ng/mL Final    Comment:    (NOTE)                             Nonsmokers          <3.9                             Smokers             <5.6 Roche [employer] Electrochemiluminescence Immunoassay (ECLIA) Values obtained with different assay methods or  kits cannot be used interchangeably.  Results cannot be interpreted as absolute evidence of the presence or absence of malignant disease. Performed At: [facility] [address] [place], [place] [id number] Jennette Shorter MD Ey:[id number]    No results found for: PSA1 No results found for: CAN199 Lab Results  Component Value Date   CAN125 10.2 04/12/2021    No results found for: TOTALPROTELP, ALBUMINELP, A1GS, A2GS,  BETS, BETA2SER, GAMS, MSPIKE, SPEI Lab Results  Component Value Date   TIBC 395 08/26/2022   TIBC 358 10/22/2021   TIBC 464 (H) 07/22/2021   FERRITIN 52 08/26/2022   FERRITIN 47 10/22/2021   FERRITIN 24 07/22/2021   IRONPCTSAT 19 08/26/2022   IRONPCTSAT 19 10/22/2021   IRONPCTSAT 15 07/22/2021   No results found for: LDH   STUDIES:   CT ABDOMEN PELVIS W CONTRAST Result Date: 06/30/2023 CLINICAL DATA:  Follow-up colon carcinoma. Surveillance. * Tracking Code: BO * EXAM: CT ABDOMEN AND PELVIS WITH CONTRAST TECHNIQUE: Multidetector CT imaging of the abdomen and pelvis was performed using the standard protocol following bolus administration of intravenous contrast. RADIATION DOSE REDUCTION: This exam was performed according to the departmental dose-optimization program which includes automated exposure control, adjustment of the mA and/or kV according to patient size and/or use of iterative reconstruction technique. CONTRAST:  OMNIPAQUE  IOHEXOL  300 MG/ML  SOLN COMPARISON:  12/02/2022 FINDINGS: Lower Chest: No acute findings. Hepatobiliary: No suspicious hepatic masses identified. Moderate hepatic steatosis again noted. Gallbladder is unremarkable. No evidence of biliary ductal dilatation. Pancreas:  No mass or inflammatory changes. Spleen: Within normal limits in size and appearance. Adrenals/Urinary Tract: Stable tiny left adrenal nodule, consistent with benign etiology. No suspicious masses identified. 4 mm left renal calculus again  noted. No evidence of ureteral calculi or hydronephrosis. Stomach/Bowel: Stable postop changes from previous subtotal colectomy. No soft tissue mass identified. No evidence of obstruction, inflammatory process or abnormal fluid collections. Vascular/Lymphatic: No pathologically enlarged lymph nodes. No acute vascular findings. Reproductive: Prior hysterectomy noted. Adnexal regions are unremarkable in appearance. Other:  None. Musculoskeletal:  No suspicious bone lesions identified. IMPRESSION: Stable exam. No evidence of recurrent or metastatic disease within the abdomen or pelvis. Stable hepatic steatosis. Tiny left renal calculus. No evidence of ureteral calculi or hydronephrosis. Electronically Signed   By: Norleen DELENA Kil M.D.   On: 06/30/2023 11:01

## 2023-07-07 ENCOUNTER — Inpatient Hospital Stay: Payer: Self-pay | Attending: Hematology | Admitting: Hematology

## 2023-07-07 VITALS — BP 165/112 | HR 83 | Temp 99.0°F | Resp 18 | Wt 228.8 lb

## 2023-07-07 DIAGNOSIS — Z9049 Acquired absence of other specified parts of digestive tract: Secondary | ICD-10-CM | POA: Insufficient documentation

## 2023-07-07 DIAGNOSIS — C186 Malignant neoplasm of descending colon: Secondary | ICD-10-CM

## 2023-07-07 DIAGNOSIS — Z85038 Personal history of other malignant neoplasm of large intestine: Secondary | ICD-10-CM | POA: Insufficient documentation

## 2023-07-07 DIAGNOSIS — Z9221 Personal history of antineoplastic chemotherapy: Secondary | ICD-10-CM | POA: Insufficient documentation

## 2023-07-07 NOTE — Progress Notes (Signed)
 [facility] [address] [place] [address]    Clinic Day:  07/07/2023  Referring physician: Deitra Morton Sebastian Thurmon*  Patient Care Team: [facility], [name], NP as PCP - General (Nurse Practitioner) Rogers Hai, MD as Medical Oncologist (Medical Oncology) Celestia Joesph SQUIBB, RN as Oncology Nurse Navigator (Medical Oncology)   ASSESSMENT & PLAN:   Assessment: 1. Stage IIIb (T3N1B) descending colon cancer: - Presentation with abdominal pain for few weeks to the ER.  History of IBS and had colonoscopy more than 10 years ago.  No weight loss. - CT CAP with contrast (04/12/2021): Obstructive 5 cm distal descending colon neoplasm with associated proximal stercoral colitis with no bowel perforation.  2 indeterminate right upper lobe lung nodules 1 calcified and 1 noncalcified measuring up to 5 mm.  2 approximately 5 mm hyperdense foci along the gallbladder lumen which may represent gallbladder polyp or stone. - Sigmoidoscopy (04/13/2021) fungating infiltrative obstructing large mass found in the descending colon. - Left hemicolectomy (04/15/2021): Moderately differentiated adenocarcinoma, grade 2, 2/35 lymph nodes positive, margins negative, PT3PN1B.  MMR preserved. - Preoperative CEA (04/12/2021): 2.5. - Adjuvant chemotherapy with 3 months of CapeOx was recommended with close monitoring of right lung nodules on subsequent scans. - 4 cycles of CapeOx from 05/20/2021 through 07/22/2021 - Germline mutation testing: Negative   2. Social/family history: - She lives at home with her husband.  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  No chemical exposure.  Quit smoking at age 52. - Maternal aunt had brain tumor.  Mother had breast cancer.  Maternal aunt died of colon cancer in her 27s.  Maternal first cousin died of colon cancer in his 30s.  Maternal uncle had stomach cancer.  Paternal aunt had breast cancer.  Maternal grandfather had lung cancer.    Plan: 1. Stage IIIb  (T3N1B) descending colon cancer, MMR stable: - She denies any change in bowel habits.  Denies any BRBPR.  Occasional dark stools. - Last colonoscopy was in August 2024. - Labs from 06/26/2023: Normal LFTs.  CBC was normal.  CEA was 2.3. - CTAP on 06/26/2023: No evidence of recurrence.  Other benign findings were discussed. - Signatera test from 06/29/2023 is pending. - She has completed 2 years since chemotherapy was completed.  I would recommend changing her follow-ups to every 6 months and imaging to once a year.  RTC 6 months with Signatera, CEA, CBC and CMP.  2.  Diarrhea: - Continue fiber supplements as needed.  It is helping to thicken the stool.  3.  Hypertension: - She is not on any antihypertensives due to allergic reactions to ACE inhibitors.  She reports that her blood pressure is usually normal at home.    Orders Placed This Encounter  Procedures   CBC with Differential    Standing Status:   Future    Expected Date:   01/07/2024    Expiration Date:   04/06/2024   Comprehensive metabolic panel    Standing Status:   Future    Expected Date:   01/07/2024    Expiration Date:   04/06/2024   CEA    Standing Status:   Future    Expected Date:   01/07/2024    Expiration Date:   04/06/2024   Signatera    Select as applicable. If patient is on or planning to receive immunotherapy, select drug: Not on Immunotherapy If Other or Multiple, Write down drug name:  Do not Delete Below This Line   ==========  Department Information========== ID: [id number] Department:[facility] - A DEPT OF [facility] [address] [place] [address] Dept: [phone] Dept Fax: [phone]    Standing Status:   Future    Expected Date:   01/07/2024    Expiration Date:   07/06/2024    Jennell to follow up with patient for sample collection (mobile phleb, lab, or saliva)::   No    Surveillance Program draw frequency::   Every 6 Months     Surveillance Program draw count::   4    Cancer type::   Colon    Stage of diagnosis::   III    History of recurrence?:   No    Current disease status::   No evidence of disease    By placing this electronic order I confirm the testing ordered herein is medically necessary and this patient has been informed of the details of the genetic test(s) ordered, including the risks, benefits, and alternatives, and has consented to testing.:   Yes    What type of billing?:   [employer] R Teague,acting as a Neurosurgeon for [employer], MD.,have documented all relevant documentation on the behalf of Alean Stands, MD,as directed by  Alean Stands, MD while in the presence of Alean Stands, MD.  I, Alean Stands MD, have reviewed the above documentation for accuracy and completeness, and I agree with the above.    Alean Stands, MD   7/1/20254:31 PM  CHIEF COMPLAINT:   Diagnosis: descending colon cancer    Cancer Staging  Colon cancer [facility]) Staging form: Colon and Rectum, AJCC 8th Edition - Clinical stage from 05/09/2021: Stage IIIB (cT3, cN1b, cM0) - Unsigned    Prior Therapy: 1. Left hemicolectomy (04/15/2021) 2.  4 cycles of CapeOx completed on 07/22/2021  Current Therapy:  surveillance    HISTORY OF PRESENT ILLNESS:   Oncology History  Colon cancer (HCC)  05/09/2021 Initial Diagnosis   Colon cancer (HCC)   05/20/2021 - 07/22/2021 Chemotherapy   Patient is on Treatment Plan : COLORECTAL Xelox (Capeox) q21d     07/17/2022 Genetic Testing   Negative genetic testing on the Multi-cancer + RNA panel.  The report date is July 17, 2022.  The Multi-Cancer + RNA Panel offered by [facility] includes sequencing and/or deletion/duplication analysis of the following 70 genes:  AIP*, ALK, APC*, ATM*, AXIN2*, BAP1*, BARD1*, BLM*, BMPR1A*, BRCA1*, BRCA2*, BRIP1*, CDC73*, CDH1*, CDK4, CDKN1B*, CDKN2A, CHEK2*, CTNNA1*, DICER1*, EPCAM (del/dup only), EGFR,  FH*, FLCN*, GREM1 (promoter dup only), HOXB13, KIT, LZTR1, MAX*, MBD4, MEN1*, MET, MITF, MLH1*, MSH2*, MSH3*, MSH6*, MUTYH*, NF1*, NF2*, NTHL1*, PALB2*, PDGFRA, PMS2*, POLD1*, POLE*, POT1*, PRKAR1A*, PTCH1*, PTEN*, RAD51C*, RAD51D*, RB1*, RET, SDHA* (sequencing only), SDHAF2*, SDHB*, SDHC*, SDHD*, SMAD4*, SMARCA4*, SMARCB1*, SMARCE1*, STK11*, SUFU*, TMEM127*, TP53*, TSC1*, TSC2*, VHL*. RNA analysis is performed for * genes.      INTERVAL HISTORY:   Alison Ramsey is a 53 y.o. female presenting to clinic today for follow up of descending colon cancer. She was last seen by me on 03/30/23.  Since her last visit, she underwent CT AP on 06/26/23 that found: Stable exam. No evidence of recurrent or metastatic disease within the abdomen or pelvis. Stable hepatic steatosis. Tiny left renal calculus. No evidence of ureteral calculi or hydronephrosis.  Today, she states that she is doing well overall. Her appetite level is at 100%. Her energy level is at 75%. Teckla denies any changes  in BM's or BRBPR. She notes intermittent melena and does not take Maalox, iron supplements, or Pepto Bismol.   Mayreli has started taking fiber supplements to improve food absorption and prevent BM's immediately after eating.   She states she has normal blood pressure readings at home and is not taking any blood pressure medications as she had an allergic reaction to ACE inhibitors. Aricela has not been seen by cardiology yet because she does not currently have insurance.   PAST MEDICAL HISTORY:   Past Medical History: Past Medical History:  Diagnosis Date   Angio-edema    Asthma    Cancer (HCC)    Colon cancer (HCC)    Eczema    Family history of breast cancer    Family history of colon cancer    Family history of stomach cancer    History of kidney stones    Hypertension    Lyme disease 2018   Urticaria     Surgical History: Past Surgical History:  Procedure Laterality Date   ABDOMINAL HYSTERECTOMY  2013    APPENDECTOMY     BIOPSY  04/13/2021   Procedure: BIOPSY;  Surgeon: Cindie Carlin POUR, DO;  [facility];  Service: Endoscopy;;  descending colon mass   BIOPSY  01/24/2022   Procedure: BIOPSY;  Surgeon: Cindie Carlin POUR, DO;  [facility];  Service: Endoscopy;;   COLONOSCOPY WITH PROPOFOL  N/A 01/24/2022   Procedure: COLONOSCOPY WITH PROPOFOL ;  Surgeon: Cindie Carlin POUR, DO;  [facility];  Service: Endoscopy;  Laterality: N/A;  1:00 pm   COLONOSCOPY WITH PROPOFOL  N/A 08/15/2022   Procedure: COLONOSCOPY WITH PROPOFOL ;  Surgeon: Cindie Carlin POUR, DO;  [facility];  Service: Endoscopy;  Laterality: N/A;  11:30AM;ASA 2   FLEXIBLE SIGMOIDOSCOPY N/A 04/13/2021   Procedure: FLEXIBLE SIGMOIDOSCOPY;  Surgeon: Cindie Carlin POUR, DO;  [facility];  Service: Endoscopy;  Laterality: N/A;   HEMOSTASIS CLIP PLACEMENT  01/24/2022   Procedure: HEMOSTASIS CLIP PLACEMENT;  Surgeon: Cindie Carlin POUR, DO;  [facility];  Service: Endoscopy;;   PARTIAL COLECTOMY N/A 04/15/2021   Procedure: EXTENDED RIGHT HEMICOLECTOMY;  Surgeon: Mavis Anes, MD;  Location: [facility];  Service: General;  Laterality: N/A;   POLYPECTOMY  01/24/2022   Procedure: POLYPECTOMY;  Surgeon: Cindie Carlin POUR, DO;  [facility];  Service: Endoscopy;;   POLYPECTOMY  08/15/2022   Procedure: POLYPECTOMY;  Surgeon: Cindie Carlin POUR, DO;  [facility];  Service: Endoscopy;;   PORT-A-CATH REMOVAL N/A 12/29/2022   Procedure: MINOR REMOVAL PORT-A-CATH;  Surgeon: Mavis Anes, MD;  Location: [facility];  Service: General;  Laterality: N/A;   PORTACATH PLACEMENT Left 05/13/2021   Procedure: INSERTION PORT-A-CATH;  Surgeon: Mavis Anes, MD;  Location: [facility];  Service: General;  Laterality: Left;   SUBMUCOSAL LIFTING INJECTION  01/24/2022   Procedure: SUBMUCOSAL LIFTING INJECTION;  Surgeon: Cindie Carlin POUR, DO;  [facility];  Service: Endoscopy;;    WISDOM TOOTH EXTRACTION      Social History: Social History   Socioeconomic History   Marital status: Married    Spouse name: Not on file   Number of children: Not on file   Years of education: Not on file   Highest education level: Associate degree: occupational, Scientist, product/process development, or vocational program  Occupational History   Not on file  Tobacco Use   Smoking status: Former    Types: Cigarettes    Passive exposure: Past  Smokeless tobacco: Never  Vaping Use   Vaping status: Never Used  Substance and Sexual Activity   Alcohol use: Yes    Comment: occasionally   Drug use: Yes    Types: Marijuana   Sexual activity: Yes  Other Topics Concern   Not on file  Social History Narrative   Not on file   Social Drivers of Health   Financial Resource Strain: Low Risk  (02/10/2023)   Overall Financial Resource Strain (CARDIA)    Difficulty of Paying Living Expenses: Not very hard  Food Insecurity: No Food Insecurity (02/10/2023)   Hunger Vital Sign    Worried About Running Out of Food in the Last Year: Never true    Ran Out of Food in the Last Year: Never true  Transportation Needs: No Transportation Needs (02/10/2023)   PRAPARE - Administrator, Civil Service (Medical): No    Lack of Transportation (Non-Medical): No  Physical Activity: Sufficiently Active (02/10/2023)   Exercise Vital Sign    Days of Exercise per Week: 7 days    Minutes of Exercise per Session: 30 min  Stress: Patient Declined (02/10/2023)   [employer] of Occupational Health - Occupational Stress Questionnaire    Feeling of Stress : Patient declined  Social Connections: Moderately Integrated (02/10/2023)   Social Connection and Isolation Panel    Frequency of Communication with Friends and Family: More than three times a week    Frequency of Social Gatherings with Friends and Family: More than three times a week    Attends Religious Services: More than 4 times per year    Active Member of [employer] or  Organizations: No    Attends Engineer, structural: Not on file    Marital Status: Married  Catering manager Violence: Not on file    Family History: Family History  Problem Relation Age of Onset   Kidney disease Mother    Hypertension Mother    Breast cancer Mother 67 - 75   Stroke Father    Colon cancer Maternal Aunt 49       d. 58   Other Maternal Aunt 44       pituitary cancer   Breast cancer Maternal Aunt 38   Stomach cancer Maternal Uncle 83   Breast cancer Paternal Aunt    Lung cancer Maternal Grandfather    Colon cancer Cousin        d. 30s; maternal first cousin    Current Medications:  Current Outpatient Medications:    acidophilus (RISAQUAD) CAPS capsule, Take 1 capsule by mouth daily., Disp: , Rfl:    albuterol  (VENTOLIN  HFA) 108 (90 Base) MCG/ACT inhaler, Inhale 2 puffs into the lungs every 6 (six) hours as needed for wheezing or shortness of breath., Disp: 8 g, Rfl: 0   aspirin EC 81 MG tablet, Take 81 mg by mouth daily. Swallow whole., Disp: , Rfl:    benzonatate  (TESSALON ) 100 MG capsule, Take 1 capsule (100 mg total) by mouth 3 (three) times daily as needed for cough., Disp: 30 capsule, Rfl: 0   cetirizine (ZYRTEC) 10 MG tablet, Take 10 mg by mouth daily., Disp: , Rfl:    Lactobacillus (ACIDOPHILUS PO), Take 1 capsule by mouth at bedtime., Disp: , Rfl:    loratadine (CLARITIN) 10 MG tablet, Take 10 mg by mouth daily., Disp: , Rfl:    MAGNESIUM PO, Take 400 mg by mouth every evening., Disp: , Rfl:    VITAMIN D  PO, Take 1,000 Units  by mouth at bedtime., Disp: , Rfl:    amLODipine  (NORVASC ) 5 MG tablet, Take 1 tablet (5 mg total) by mouth daily., Disp: 90 tablet, Rfl: 0   metoprolol  tartrate (LOPRESSOR ) 25 MG tablet, Take 1 tablet (25 mg total) by mouth 2 (two) times daily., Disp: 180 tablet, Rfl: 0   spironolactone  (ALDACTONE ) 25 MG tablet, Take 25 mg by mouth 2 (two) times daily., Disp: , Rfl:    Allergies: Allergies  Allergen Reactions   Aleve  [Naproxen] Hives    Ok with ibuprofen   Hydrochlorothiazide  Hives   Lisinopril  Hives, Itching and Swelling   Prednisolone Hives   Codeine Hives, Nausea And Vomiting and Rash   Latex Rash    REVIEW OF SYSTEMS:   Review of Systems  Constitutional:  Negative for chills, fatigue and fever.  HENT:   Negative for lump/mass, mouth sores, nosebleeds, sore throat and trouble swallowing.   Eyes:  Negative for eye problems.  Respiratory:  Negative for cough and shortness of breath.   Cardiovascular:  Negative for chest pain, leg swelling and palpitations.  Gastrointestinal:  Negative for abdominal pain, constipation, diarrhea, nausea and vomiting.  Genitourinary:  Negative for bladder incontinence, difficulty urinating, dysuria, frequency, hematuria and nocturia.   Musculoskeletal:  Negative for arthralgias, back pain, flank pain, myalgias and neck pain.  Skin:  Negative for itching and rash.  Neurological:  Negative for dizziness, headaches and numbness.  Hematological:  Does not bruise/bleed easily.  Psychiatric/Behavioral:  Negative for depression, sleep disturbance and suicidal ideas. The patient is not nervous/anxious.   All other systems reviewed and are negative.    VITALS:   Blood pressure (!) 165/112, pulse 83, temperature 99 F (37.2 C), temperature source Tympanic, resp. rate 18, weight 228 lb 13.4 oz (103.8 kg), SpO2 98%.  Wt Readings from Last 3 Encounters:  07/07/23 228 lb 13.4 oz (103.8 kg)  03/30/23 240 lb 4.8 oz (109 kg)  02/18/23 241 lb 12.8 oz (109.7 kg)    Body mass index is 36.94 kg/m.  Performance status (ECOG): 1 - Symptomatic but completely ambulatory  PHYSICAL EXAM:   Physical Exam Vitals and nursing note reviewed. Exam conducted with a chaperone present.  Constitutional:      Appearance: Normal appearance.   Cardiovascular:     Rate and Rhythm: Normal rate and regular rhythm.     Pulses: Normal pulses.     Heart sounds: Normal heart sounds.   Pulmonary:     Effort: Pulmonary effort is normal.     Breath sounds: Normal breath sounds.  Abdominal:     Palpations: Abdomen is soft. There is no hepatomegaly, splenomegaly or mass.     Tenderness: There is no abdominal tenderness.   Musculoskeletal:     Right lower leg: No edema.     Left lower leg: No edema.  Lymphadenopathy:     Cervical: No cervical adenopathy.     Right cervical: No superficial, deep or posterior cervical adenopathy.    Left cervical: No superficial, deep or posterior cervical adenopathy.     Upper Body:     Right upper body: No supraclavicular or axillary adenopathy.     Left upper body: No supraclavicular or axillary adenopathy.   Neurological:     General: No focal deficit present.     Mental Status: She is alert and oriented to person, place, and time.   Psychiatric:        Mood and Affect: Mood normal.  Behavior: Behavior normal.     LABS:   CBC     Component Value Date/Time   WBC 8.0 06/26/2023 1235   RBC 4.58 06/26/2023 1235   HGB 12.9 06/26/2023 1235   HGB 14.2 07/17/2016 1118   HCT 37.9 06/26/2023 1235   HCT 40.6 07/17/2016 1118   PLT 230 06/26/2023 1235   PLT 262 07/17/2016 1118   MCV 82.8 06/26/2023 1235   MCV 85 07/17/2016 1118   MCH 28.2 06/26/2023 1235   MCHC 34.0 06/26/2023 1235   RDW 14.7 06/26/2023 1235   RDW 13.9 07/17/2016 1118   LYMPHSABS 1.5 06/26/2023 1235   LYMPHSABS 1.6 07/17/2016 1118   MONOABS 0.5 06/26/2023 1235   EOSABS 0.1 06/26/2023 1235   EOSABS 0.1 07/17/2016 1118   BASOSABS 0.0 06/26/2023 1235   BASOSABS 0.0 07/17/2016 1118    CMP      Component Value Date/Time   NA 137 06/26/2023 1235   NA 138 02/18/2023 1204   K 3.8 06/26/2023 1235   CL 104 06/26/2023 1235   CO2 25 06/26/2023 1235   GLUCOSE 245 (H) 06/26/2023 1235   BUN 13 06/26/2023 1235   BUN 16 02/18/2023 1204   CREATININE 0.82 06/26/2023 1235   CALCIUM 8.9 06/26/2023 1235   PROT 6.9 06/26/2023 1235   PROT 7.0 02/18/2023 1204    ALBUMIN 3.8 06/26/2023 1235   ALBUMIN 4.5 02/18/2023 1204   AST 23 06/26/2023 1235   ALT 30 06/26/2023 1235   ALKPHOS 93 06/26/2023 1235   BILITOT 0.9 06/26/2023 1235   BILITOT 0.6 02/18/2023 1204   GFRNONAA >60 06/26/2023 1235   GFRAA 68 05/19/2017 1605     Lab Results  Component Value Date   CEA1 2.3 06/26/2023   /  CEA  Date Value Ref Range Status  06/26/2023 2.3 0.0 - 4.7 ng/mL Final    Comment:    (NOTE)                             Nonsmokers          <3.9                             Smokers             <5.6 Roche [employer]ostics Electrochemiluminescence Immunoassay (ECLIA) Values obtained with different assay methods or kits cannot be used interchangeably.  Results cannot be interpreted as absolute evidence of the presence or absence of malignant disease. Performed At: [facility] [address] [place], [place] [id number] Jennette Shorter MD Ey:[phone]    No results found for: PSA1 No results found for: CAN199 Lab Results  Component Value Date   CAN125 10.2 04/12/2021    No results found for: TOTALPROTELP, ALBUMINELP, A1GS, A2GS, BETS, BETA2SER, GAMS, MSPIKE, SPEI Lab Results  Component Value Date   TIBC 395 08/26/2022   TIBC 358 10/22/2021   TIBC 464 (H) 07/22/2021   FERRITIN 52 08/26/2022   FERRITIN 47 10/22/2021   FERRITIN 24 07/22/2021   IRONPCTSAT 19 08/26/2022   IRONPCTSAT 19 10/22/2021   IRONPCTSAT 15 07/22/2021   No results found for: LDH   STUDIES:   CT ABDOMEN PELVIS W CONTRAST Result Date: 06/30/2023 CLINICAL DATA:  Follow-up colon carcinoma. Surveillance. * Tracking Code: BO * EXAM: CT ABDOMEN AND PELVIS WITH CONTRAST TECHNIQUE: Multidetector CT imaging of the abdomen and pelvis was performed using the standard  protocol following bolus administration of intravenous contrast. RADIATION DOSE REDUCTION: This exam was performed according to the departmental dose-optimization program which includes automated  exposure control, adjustment of the mA and/or kV according to patient size and/or use of iterative reconstruction technique. CONTRAST:  OMNIPAQUE  IOHEXOL  300 MG/ML  SOLN COMPARISON:  12/02/2022 FINDINGS: Lower Chest: No acute findings. Hepatobiliary: No suspicious hepatic masses identified. Moderate hepatic steatosis again noted. Gallbladder is unremarkable. No evidence of biliary ductal dilatation. Pancreas:  No mass or inflammatory changes. Spleen: Within normal limits in size and appearance. Adrenals/Urinary Tract: Stable tiny left adrenal nodule, consistent with benign etiology. No suspicious masses identified. 4 mm left renal calculus again noted. No evidence of ureteral calculi or hydronephrosis. Stomach/Bowel: Stable postop changes from previous subtotal colectomy. No soft tissue mass identified. No evidence of obstruction, inflammatory process or abnormal fluid collections. Vascular/Lymphatic: No pathologically enlarged lymph nodes. No acute vascular findings. Reproductive: Prior hysterectomy noted. Adnexal regions are unremarkable in appearance. Other:  None. Musculoskeletal:  No suspicious bone lesions identified. IMPRESSION: Stable exam. No evidence of recurrent or metastatic disease within the abdomen or pelvis. Stable hepatic steatosis. Tiny left renal calculus. No evidence of ureteral calculi or hydronephrosis. Electronically Signed   By: Norleen DELENA Kil M.D.   On: 06/30/2023 11:01

## 2023-07-11 LAB — SIGNATERA
SIGNATERA MTM READOUT: 0 MTM/ml
SIGNATERA TEST RESULT: NEGATIVE

## 2023-07-15 ENCOUNTER — Encounter: Payer: Self-pay | Admitting: Family Medicine

## 2023-07-20 ENCOUNTER — Encounter: Payer: Self-pay | Admitting: Family Medicine

## 2023-07-27 ENCOUNTER — Encounter (HOSPITAL_COMMUNITY): Payer: Self-pay | Admitting: Hematology

## 2023-12-29 ENCOUNTER — Inpatient Hospital Stay: Payer: Self-pay | Attending: Hematology

## 2023-12-29 DIAGNOSIS — C186 Malignant neoplasm of descending colon: Secondary | ICD-10-CM

## 2023-12-29 DIAGNOSIS — Z9221 Personal history of antineoplastic chemotherapy: Secondary | ICD-10-CM | POA: Insufficient documentation

## 2023-12-29 DIAGNOSIS — Z85038 Personal history of other malignant neoplasm of large intestine: Secondary | ICD-10-CM | POA: Insufficient documentation

## 2023-12-29 LAB — CBC WITH DIFFERENTIAL/PLATELET
Abs Immature Granulocytes: 0.03 K/uL (ref 0.00–0.07)
Basophils Absolute: 0 K/uL (ref 0.0–0.1)
Basophils Relative: 0 %
Eosinophils Absolute: 0.1 K/uL (ref 0.0–0.5)
Eosinophils Relative: 1 %
HCT: 40.3 % (ref 36.0–46.0)
Hemoglobin: 13.9 g/dL (ref 12.0–15.0)
Immature Granulocytes: 0 %
Lymphocytes Relative: 18 %
Lymphs Abs: 1.8 K/uL (ref 0.7–4.0)
MCH: 27.4 pg (ref 26.0–34.0)
MCHC: 34.5 g/dL (ref 30.0–36.0)
MCV: 79.3 fL — ABNORMAL LOW (ref 80.0–100.0)
Monocytes Absolute: 0.5 K/uL (ref 0.1–1.0)
Monocytes Relative: 5 %
Neutro Abs: 7.2 K/uL (ref 1.7–7.7)
Neutrophils Relative %: 76 %
Platelets: 284 K/uL (ref 150–400)
RBC: 5.08 MIL/uL (ref 3.87–5.11)
RDW: 14.3 % (ref 11.5–15.5)
WBC: 9.6 K/uL (ref 4.0–10.5)
nRBC: 0 % (ref 0.0–0.2)

## 2023-12-29 LAB — COMPREHENSIVE METABOLIC PANEL WITH GFR
ALT: 17 U/L (ref 0–44)
AST: 22 U/L (ref 15–41)
Albumin: 4.7 g/dL (ref 3.5–5.0)
Alkaline Phosphatase: 84 U/L (ref 38–126)
Anion gap: 15 (ref 5–15)
BUN: 12 mg/dL (ref 6–20)
CO2: 23 mmol/L (ref 22–32)
Calcium: 9.8 mg/dL (ref 8.9–10.3)
Chloride: 102 mmol/L (ref 98–111)
Creatinine, Ser: 0.77 mg/dL (ref 0.44–1.00)
GFR, Estimated: >60 mL/min
Glucose, Bld: 104 mg/dL — ABNORMAL HIGH (ref 70–99)
Potassium: 3.8 mmol/L (ref 3.5–5.1)
Sodium: 139 mmol/L (ref 135–145)
Total Bilirubin: 1 mg/dL (ref 0.0–1.2)
Total Protein: 7.5 g/dL (ref 6.5–8.1)

## 2023-12-30 LAB — CEA: CEA: 2.2 ng/mL (ref 0.0–4.7)

## 2024-01-04 ENCOUNTER — Encounter: Payer: Self-pay | Admitting: *Deleted

## 2024-01-12 LAB — SIGNATERA
SIGNATERA MTM READOUT: 0 MTM/ml
SIGNATERA TEST RESULT: NEGATIVE

## 2024-01-13 ENCOUNTER — Inpatient Hospital Stay: Payer: Self-pay | Attending: Hematology | Admitting: Oncology

## 2024-01-13 VITALS — BP 207/107 | HR 86 | Temp 98.8°F | Resp 18 | Ht 65.0 in | Wt 210.0 lb

## 2024-01-13 DIAGNOSIS — C189 Malignant neoplasm of colon, unspecified: Secondary | ICD-10-CM

## 2024-01-13 NOTE — Assessment & Plan Note (Addendum)
-   She denies any change in bowel habits.  Denies any BRBPR.  Occasional dark stools. - Last colonoscopy was in August 2024-repeat in August 2027. - Labs from 12/29/2023: Normal LFTs.  CBC was normal.  CEA was 2.2. - CTAP on 06/26/2023: No evidence of recurrence.  Other benign findings were discussed.  Repeat annually. - Signatera test from 12/29/2023 was negative - She has completed 2 years since chemotherapy was completed.  -Continue 6 months with labs (CEA, CBC, CMP and Signatera testing) and CT scan annually.

## 2024-01-13 NOTE — Progress Notes (Signed)
 "  [facility] OFFICE PROGRESS NOTE  Alison Ramsey, [name], NP  ASSESSMENT & PLAN:    Assessment & Plan Malignant neoplasm of colon, unspecified part of colon (HCC) - She denies any change in bowel habits.  Denies any BRBPR.  Occasional dark stools. - Last colonoscopy was in August 2024-repeat in August 2027. - Labs from 12/29/2023: Normal LFTs.  CBC was normal.  CEA was 2.2. - CTAP on 06/26/2023: No evidence of recurrence.  Other benign findings were discussed.  Repeat annually. - Signatera test from 12/29/2023 was negative - She has completed 2 years since chemotherapy was completed.  -Continue 6 months with labs (CEA, CBC, CMP and Signatera testing) and CT scan annually.   Orders Placed This Encounter  Procedures   CT ABDOMEN PELVIS W CONTRAST    Standing Status:   Future    Expected Date:   07/12/2024    Expiration Date:   01/12/2025    If indicated for the ordered procedure, I authorize the administration of contrast media per Radiology protocol:   Yes    Does the patient have a contrast media/X-ray dye allergy?:   No    Preferred imaging location?:   [facility]    If indicated for the ordered procedure, I authorize the administration of oral contrast media per Radiology protocol:   Yes   Signatera    Select as applicable. If patient is on or planning to receive immunotherapy, select drug: Not on Immunotherapy If Other or Multiple, Write down drug name:  Do not Delete Below This Line   ==========Department Information========== ID: [id number] Department:[facility] [place] - A DEPT OF [facility] [address] [place] [address] Dept: [phone] Dept Fax: [phone]    Standing Status:   Future    Expected Date:   07/06/2024    Expiration Date:   01/12/2025    Jennell to follow up with patient for sample collection (mobile phleb, lab, or saliva)::   No    Surveillance Program draw  frequency::   Every 6 Months    Surveillance Program draw count::   4    Cancer type::   Colon    Stage of diagnosis::   III    History of recurrence?:   No    Current disease status::   No evidence of disease    By placing this electronic order I confirm the testing ordered herein is medically necessary and this patient has been informed of the details of the genetic test(s) ordered, including the risks, benefits, and alternatives, and has consented to testing.:   Yes    What type of billing?:   Bill Insurance   CBC with Differential    Standing Status:   Future    Expected Date:   07/06/2024    Expiration Date:   01/12/2025   Comprehensive metabolic panel    Standing Status:   Future    Expected Date:   07/06/2024    Expiration Date:   01/12/2025   CEA    Standing Status:   Future    Expected Date:   07/06/2024    Expiration Date:   01/12/2025    INTERVAL HISTORY: Patient returns for follow-up.  Patient was last seen by Dr. Rogers on 07/07/2023.  Patient denies any interval hospitalizations, surgeries or changes to baseline health.  Overall, she is doing well.  Appetite and energy levels are 100%.  Denies  any pain.  She occasionally will have some epigastric pain and it feels hard likely secondary to scar tissue previous colonoscopy/EGD.  She most recently had a CT scan on 06/26/2023 which did not reveal evidence of recurrence.  She was recently switched to annual CT scans after completing 2 years of every 56-month scans.  She is here to review her most recent lab results.  We reviewed CBC, CMP, Signatera and CEA.  SUMMARY OF HEMATOLOGIC HISTORY: Oncology History  Colon cancer (HCC)  05/09/2021 Initial Diagnosis   Colon cancer (HCC)   05/20/2021 - 07/22/2021 Chemotherapy   Patient is on Treatment Plan : COLORECTAL Xelox (Capeox) q21d     07/17/2022 Genetic Testing   Negative genetic testing on the Multi-cancer + RNA panel.  The report date is July 17, 2022.  The Multi-Cancer + RNA  Panel offered by [facility] includes sequencing and/or deletion/duplication analysis of the following 70 genes:  AIP*, ALK, APC*, ATM*, AXIN2*, BAP1*, BARD1*, BLM*, BMPR1A*, BRCA1*, BRCA2*, BRIP1*, CDC73*, CDH1*, CDK4, CDKN1B*, CDKN2A, CHEK2*, CTNNA1*, DICER1*, EPCAM (del/dup only), EGFR, FH*, FLCN*, GREM1 (promoter dup only), HOXB13, KIT, LZTR1, MAX*, MBD4, MEN1*, MET, MITF, MLH1*, MSH2*, MSH3*, MSH6*, MUTYH*, NF1*, NF2*, NTHL1*, PALB2*, PDGFRA, PMS2*, POLD1*, POLE*, POT1*, PRKAR1A*, PTCH1*, PTEN*, RAD51C*, RAD51D*, RB1*, RET, SDHA* (sequencing only), SDHAF2*, SDHB*, SDHC*, SDHD*, SMAD4*, SMARCA4*, SMARCB1*, SMARCE1*, STK11*, SUFU*, TMEM127*, TP53*, TSC1*, TSC2*, VHL*. RNA analysis is performed for * genes.    1. Stage IIIb (T3N1B) descending colon cancer: - Presentation with abdominal pain for few weeks to the ER.  History of IBS and had colonoscopy more than 10 years ago.  No weight loss. - CT CAP with contrast (04/12/2021): Obstructive 5 cm distal descending colon neoplasm with associated proximal stercoral colitis with no bowel perforation.  2 indeterminate right upper lobe lung nodules 1 calcified and 1 noncalcified measuring up to 5 mm.  2 approximately 5 mm hyperdense foci along the gallbladder lumen which may represent gallbladder polyp or stone. - Sigmoidoscopy (04/13/2021) fungating infiltrative obstructing large mass found in the descending colon. - Left hemicolectomy (04/15/2021): Moderately differentiated adenocarcinoma, grade 2, 2/35 lymph nodes positive, margins negative, PT3PN1B.  MMR preserved. - Preoperative CEA (04/12/2021): 2.5. - Adjuvant chemotherapy with 3 months of CapeOx was recommended with close monitoring of right lung nodules on subsequent scans. - 4 cycles of CapeOx from 05/20/2021 through 07/22/2021 - Germline mutation testing: Negative   2. Social/family history: - She lives at home with her husband.  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  No chemical exposure.  Quit smoking at age  60. - Maternal aunt had brain tumor.  Mother had breast cancer.  Maternal aunt died of colon cancer in her 18s.  Maternal first cousin died of colon cancer in his 30s.  Maternal uncle had stomach cancer.  Paternal aunt had breast cancer.  Maternal grandfather had lung cancer.  CBC    Component Value Date/Time   WBC 9.6 12/29/2023 1533   RBC 5.08 12/29/2023 1533   HGB 13.9 12/29/2023 1533   HGB 14.2 07/17/2016 1118   HCT 40.3 12/29/2023 1533   HCT 40.6 07/17/2016 1118   PLT 284 12/29/2023 1533   PLT 262 07/17/2016 1118   MCV 79.3 (L) 12/29/2023 1533   MCV 85 07/17/2016 1118   MCH 27.4 12/29/2023 1533   MCHC 34.5 12/29/2023 1533   RDW 14.3 12/29/2023 1533   RDW 13.9 07/17/2016 1118   LYMPHSABS 1.8 12/29/2023 1533   LYMPHSABS 1.6 07/17/2016 1118   MONOABS 0.5  12/29/2023 1533   EOSABS 0.1 12/29/2023 1533   EOSABS 0.1 07/17/2016 1118   BASOSABS 0.0 12/29/2023 1533   BASOSABS 0.0 07/17/2016 1118       Latest Ref Rng & Units 12/29/2023    3:33 PM 06/26/2023   12:35 PM 03/10/2023    2:13 PM  CMP  Glucose 70 - 99 mg/dL 895  754  825   BUN 6 - 20 mg/dL 12  13  11    Creatinine 0.44 - 1.00 mg/dL 9.22  9.17  9.22   Sodium 135 - 145 mmol/L 139  137  133   Potassium 3.5 - 5.1 mmol/L 3.8  3.8  3.9   Chloride 98 - 111 mmol/L 102  104  99   CO2 22 - 32 mmol/L 23  25  26    Calcium 8.9 - 10.3 mg/dL 9.8  8.9  9.3   Total Protein 6.5 - 8.1 g/dL 7.5  6.9  7.1   Total Bilirubin 0.0 - 1.2 mg/dL 1.0  0.9  0.9   Alkaline Phos 38 - 126 U/L 84  93  88   AST 15 - 41 U/L 22  23  28    ALT 0 - 44 U/L 17  30  40      Lab Results  Component Value Date   FERRITIN 52 08/26/2022    Vitals:   01/13/24 1433 01/13/24 1435  BP: (!) 268/106 (!) 207/107  Pulse: 86   Resp: 18   Temp: 98.8 F (37.1 C)   SpO2: 100%     Review of System:  Review of Systems  Gastrointestinal:  Positive for abdominal pain (Epigastric at times).    Physical Exam: Physical Exam Constitutional:      Appearance:  Normal appearance.  HENT:     Head: Normocephalic and atraumatic.  Eyes:     Pupils: Pupils are equal, round, and reactive to light.  Cardiovascular:     Rate and Rhythm: Normal rate and regular rhythm.     Heart sounds: Normal heart sounds. No murmur heard. Pulmonary:     Effort: Pulmonary effort is normal.     Breath sounds: Normal breath sounds. No wheezing.  Abdominal:     General: Bowel sounds are normal. There is no distension.     Palpations: Abdomen is soft.     Tenderness: There is no abdominal tenderness.  Musculoskeletal:        General: Normal range of motion.     Cervical back: Normal range of motion.  Skin:    General: Skin is warm and dry.     Findings: No rash.  Neurological:     Mental Status: She is alert and oriented to person, place, and time.     Gait: Gait is intact.  Psychiatric:        Mood and Affect: Mood and affect normal.        Cognition and Memory: Memory normal.        Judgment: Judgment normal.      I spent 20 minutes dedicated to the care of this patient (face-to-face and non-face-to-face) on the date of the encounter to include what is described in the assessment and plan.,  Delon Hope, NP 01/13/2024 3:02 PM "

## 2024-01-13 NOTE — Assessment & Plan Note (Deleted)
-   She denies any change in bowel habits.  Denies any BRBPR.  Occasional dark stools. - Last colonoscopy was in August 2024. - Labs from 06/26/2023: Normal LFTs.  CBC was normal.  CEA was 2.3. - CTAP on 06/26/2023: No evidence of recurrence.  Other benign findings were discussed. - Signatera test from 06/29/2023 is pending. - She has completed 2 years since chemotherapy was completed.  I would recommend changing her follow-ups to every 6 months and imaging to once a year.  RTC 6 months with Signatera, CEA, CBC and CMP.

## 2024-07-12 ENCOUNTER — Inpatient Hospital Stay: Payer: Self-pay

## 2024-07-12 ENCOUNTER — Other Ambulatory Visit (HOSPITAL_COMMUNITY): Payer: Self-pay

## 2024-07-19 ENCOUNTER — Inpatient Hospital Stay: Payer: Self-pay | Admitting: Oncology
